# Patient Record
Sex: Male | Born: 1935 | Race: Black or African American | Hispanic: No | Smoking: Former smoker
Health system: Southern US, Community
[De-identification: ages and names within clinical notes are randomized; demographics above are authoritative.]

## PROBLEM LIST (undated history)

## (undated) DIAGNOSIS — M6281 Muscle weakness (generalized): Secondary | ICD-10-CM

## (undated) DIAGNOSIS — R32 Unspecified urinary incontinence: Secondary | ICD-10-CM

## (undated) DIAGNOSIS — E785 Hyperlipidemia, unspecified: Secondary | ICD-10-CM

## (undated) DIAGNOSIS — I69359 Hemiplegia and hemiparesis following cerebral infarction affecting unspecified side: Secondary | ICD-10-CM

## (undated) DIAGNOSIS — H409 Unspecified glaucoma: Secondary | ICD-10-CM

## (undated) DIAGNOSIS — E119 Type 2 diabetes mellitus without complications: Secondary | ICD-10-CM

## (undated) DIAGNOSIS — I10 Essential (primary) hypertension: Secondary | ICD-10-CM

## (undated) DIAGNOSIS — K219 Gastro-esophageal reflux disease without esophagitis: Secondary | ICD-10-CM

## (undated) DIAGNOSIS — D509 Iron deficiency anemia, unspecified: Secondary | ICD-10-CM

## (undated) HISTORY — DX: Hyperlipidemia, unspecified: E78.5

## (undated) HISTORY — DX: Gastro-esophageal reflux disease without esophagitis: K21.9

## (undated) HISTORY — PX: CIRCUMCISION: SUR203

## (undated) HISTORY — PX: CATARACT EXTRACTION: SUR2

## (undated) HISTORY — DX: Iron deficiency anemia, unspecified: D50.9

## (undated) HISTORY — DX: Essential (primary) hypertension: I10

## (undated) HISTORY — DX: Unspecified urinary incontinence: R32

## (undated) HISTORY — DX: Muscle weakness (generalized): M62.81

## (undated) HISTORY — DX: Hemiplegia and hemiparesis following cerebral infarction affecting unspecified side: I69.359

## (undated) HISTORY — PX: TONSILLECTOMY: SUR1361

## (undated) HISTORY — DX: Unspecified glaucoma: H40.9

## (undated) HISTORY — DX: Type 2 diabetes mellitus without complications: E11.9

---

## 2000-08-11 ENCOUNTER — Encounter
Admission: RE | Admit: 2000-08-11 | Discharge: 2000-11-09 | Payer: Self-pay | Admitting: Physical Medicine & Rehabilitation

## 2001-12-23 ENCOUNTER — Inpatient Hospital Stay (HOSPITAL_COMMUNITY): Admission: AD | Admit: 2001-12-23 | Discharge: 2001-12-28 | Payer: Self-pay | Admitting: Cardiology

## 2001-12-23 ENCOUNTER — Encounter: Payer: Self-pay | Admitting: Emergency Medicine

## 2001-12-23 ENCOUNTER — Emergency Department (HOSPITAL_COMMUNITY): Admission: EM | Admit: 2001-12-23 | Discharge: 2001-12-23 | Payer: Self-pay | Admitting: Emergency Medicine

## 2004-11-02 ENCOUNTER — Ambulatory Visit: Payer: Self-pay | Admitting: Cardiology

## 2004-11-10 ENCOUNTER — Ambulatory Visit: Payer: Self-pay | Admitting: Internal Medicine

## 2006-09-12 ENCOUNTER — Ambulatory Visit: Payer: Self-pay | Admitting: Internal Medicine

## 2006-09-12 LAB — CONVERTED CEMR LAB
ALT: 23 units/L (ref 0–40)
AST: 23 units/L (ref 0–37)
Albumin: 3 g/dL — ABNORMAL LOW (ref 3.5–5.2)
Alkaline Phosphatase: 73 units/L (ref 39–117)
BUN: 7 mg/dL (ref 6–23)
Basophils Absolute: 0 10*3/uL (ref 0.0–0.1)
Basophils Relative: 0.7 % (ref 0.0–1.0)
Bilirubin, Direct: 0.1 mg/dL (ref 0.0–0.3)
CO2: 26 meq/L (ref 19–32)
Calcium: 8.5 mg/dL (ref 8.4–10.5)
Chloride: 105 meq/L (ref 96–112)
Creatinine, Ser: 0.4 mg/dL (ref 0.4–1.5)
Eosinophils Absolute: 0.4 10*3/uL (ref 0.0–0.6)
Eosinophils Relative: 7.2 % — ABNORMAL HIGH (ref 0.0–5.0)
GFR calc Af Amer: 272 mL/min
GFR calc non Af Amer: 225 mL/min
Glucose, Bld: 78 mg/dL (ref 70–99)
HCT: 39 % (ref 39.0–52.0)
Hemoglobin: 13 g/dL (ref 13.0–17.0)
Hgb A1c MFr Bld: 5.4 % (ref 4.6–6.0)
Lymphocytes Relative: 34.3 % (ref 12.0–46.0)
MCHC: 33.4 g/dL (ref 30.0–36.0)
MCV: 77.9 fL — ABNORMAL LOW (ref 78.0–100.0)
Monocytes Absolute: 0.7 10*3/uL (ref 0.2–0.7)
Monocytes Relative: 14 % — ABNORMAL HIGH (ref 3.0–11.0)
Neutro Abs: 2.2 10*3/uL (ref 1.4–7.7)
Neutrophils Relative %: 43.8 % (ref 43.0–77.0)
PSA: 0.81 ng/mL (ref 0.10–4.00)
Platelets: 172 10*3/uL (ref 150–400)
Potassium: 3.8 meq/L (ref 3.5–5.1)
RBC: 5.01 M/uL (ref 4.22–5.81)
RDW: 13.9 % (ref 11.5–14.6)
Sodium: 139 meq/L (ref 135–145)
TSH: 1.02 microintl units/mL (ref 0.35–5.50)
Total Bilirubin: 0.6 mg/dL (ref 0.3–1.2)
Total Protein: 7.2 g/dL (ref 6.0–8.3)
WBC: 5 10*3/uL (ref 4.5–10.5)

## 2007-04-10 ENCOUNTER — Encounter: Payer: Self-pay | Admitting: *Deleted

## 2007-04-10 DIAGNOSIS — I2510 Atherosclerotic heart disease of native coronary artery without angina pectoris: Secondary | ICD-10-CM | POA: Insufficient documentation

## 2007-04-10 DIAGNOSIS — K219 Gastro-esophageal reflux disease without esophagitis: Secondary | ICD-10-CM | POA: Insufficient documentation

## 2007-04-10 DIAGNOSIS — Z87898 Personal history of other specified conditions: Secondary | ICD-10-CM

## 2007-04-10 DIAGNOSIS — G8190 Hemiplegia, unspecified affecting unspecified side: Secondary | ICD-10-CM

## 2007-04-10 DIAGNOSIS — I6350 Cerebral infarction due to unspecified occlusion or stenosis of unspecified cerebral artery: Secondary | ICD-10-CM | POA: Insufficient documentation

## 2007-04-10 DIAGNOSIS — Z9861 Coronary angioplasty status: Secondary | ICD-10-CM

## 2013-05-21 ENCOUNTER — Other Ambulatory Visit: Payer: Self-pay | Admitting: *Deleted

## 2013-05-21 MED ORDER — LORAZEPAM 0.5 MG PO TABS: ORAL_TABLET | ORAL | Status: DC

## 2013-05-22 ENCOUNTER — Non-Acute Institutional Stay (SKILLED_NURSING_FACILITY): Payer: Medicare Other | Admitting: Adult Health

## 2013-05-22 DIAGNOSIS — E785 Hyperlipidemia, unspecified: Secondary | ICD-10-CM

## 2013-05-22 DIAGNOSIS — R531 Weakness: Secondary | ICD-10-CM

## 2013-05-22 DIAGNOSIS — E119 Type 2 diabetes mellitus without complications: Secondary | ICD-10-CM

## 2013-05-22 DIAGNOSIS — D509 Iron deficiency anemia, unspecified: Secondary | ICD-10-CM

## 2013-05-22 DIAGNOSIS — M6281 Muscle weakness (generalized): Secondary | ICD-10-CM

## 2013-05-22 NOTE — Progress Notes (Signed)
Patient ID: Derek Crawford, male   DOB: 08-21-1932, 79 y.o.   MRN: [id number] Provider:  Gwenith Spitz. Renato Gails, D.O., C.M.D.  Location:  [facility]  PCP: No primary provider on file.  Code Status: Full Code   Allergies not on file  Chief Complaint  Patient presents with  . Hospitalization Follow-up    iron deficiency anemia    HPI: 79 y.o. male admitted [facility] with c/o of SOB and weakness, was found to have microcytic iron deficiney anemia. Was given 2 units of PRBC and started on iron PO. While there,  blood counts increased and pt. denied having SOB and Dyspnea. He was living in a ALF but son states that he did better when he was at [facility]. Pt has a PMH of stroke with right sided weakness, HTN, diabetes, Dyslipidemia, Urinary incontience and glaucoma.   ROS: Review of Systems  Constitutional: Negative for malaise/fatigue.  Respiratory: Negative for shortness of breath.   Cardiovascular: Negative for chest pain.  Gastrointestinal: Negative for diarrhea and constipation.  Musculoskeletal:       Heel pain when he wakes up in the morning  Neurological: Negative for dizziness and weakness.  Psychiatric/Behavioral: Negative for depression. The patient is not nervous/anxious.      Medications: Patient's Medications  New Prescriptions   No medications on file  Previous Medications   ACETAMINOPHEN (TYLENOL) 325 MG TABLET    Take 650 mg by mouth every 6 (six) hours as needed for moderate pain.   AMLODIPINE (NORVASC) 2.5 MG TABLET    Take 2.5 mg by mouth daily. Hold for BP<100/60 and HR <60   ASPIRIN 81 MG TABLET    Take 81 mg by mouth daily.   CETIRIZINE (ZYRTEC) 10 MG TABLET    Take 10 mg by mouth daily.   DORZOLAMIDE (TRUSOPT) 2 % OPHTHALMIC SOLUTION    Place 1 drop into both eyes 2 (two) times daily.   ESOMEPRAZOLE (NEXIUM) 40 MG CAPSULE    Take 40 mg by mouth daily at 12 noon.   FERROUS SULFATE 325 (65 FE) MG TABLET    Take 325 mg by mouth 2 (two) times  daily with a meal.   FLUTICASONE (FLONASE) 50 MCG/ACT NASAL SPRAY    Place 1 spray into both nostrils daily.   FUROSEMIDE (LASIX) 40 MG TABLET    Take 40 mg by mouth daily.   INSULIN ASPART (NOVOLOG) 100 UNIT/ML INJECTION    Inject 10 Units into the skin 2 (two) times daily. With lunch and dinner   INSULIN ASPART (NOVOLOG) 100 UNIT/ML INJECTION    Inject 4 Units into the skin every morning.   INSULIN ASPART (NOVOLOG) 100 UNIT/ML INJECTION    Inject 5 Units into the skin 3 (three) times daily with meals as needed for high blood sugar (give additional 5 units if CBG >150).   INSULIN DETEMIR (LEVEMIR) 100 UNIT/ML INJECTION    Inject 50 Units into the skin at bedtime.   LISINOPRIL (PRINIVIL,ZESTRIL) 5 MG TABLET    Take 5 mg by mouth daily.   LORAZEPAM (ATIVAN) 0.5 MG TABLET    Take one tablet by mouth every 6 hours as needed   POLYETHYLENE GLYCOL (MIRALAX / GLYCOLAX) PACKET    Take 17 g by mouth daily.   PRAVASTATIN (PRAVACHOL) 20 MG TABLET    Take 20 mg by mouth daily.   SERTRALINE (ZOLOFT) 100 MG TABLET    Take 100 mg by mouth daily.   TAMSULOSIN (FLOMAX) 0.4 MG  CAPS CAPSULE    Take 0.4 mg by mouth daily.   TRAVOPROST, BENZALKONIUM, (TRAVATAN) 0.004 % OPHTHALMIC SOLUTION    Place 1 drop into both eyes at bedtime.   TRAZODONE (DESYREL) 50 MG TABLET    Take 50 mg by mouth at bedtime.  Modified Medications   No medications on file  Discontinued Medications   No medications on file     Physical Exam:  Physical Exam  Vitals reviewed. Constitutional: He is oriented to person, place, and time. He appears well-developed and well-nourished.  Cardiovascular: Normal rate, regular rhythm, normal heart sounds and intact distal pulses.   Pulmonary/Chest: Effort normal and breath sounds normal.  Abdominal: Soft. Bowel sounds are normal.  Neurological: He is alert and oriented to person, place, and time.  Skin: Skin is warm and dry.  Psychiatric: He has a normal mood and affect.   Assessment/Plan 1.  Iron deficiency anemia- Check labs BCC  - Continue ferrous sulfate - Check labs, CBC  2. Other and unspecified hyperlipidemia - Continue Pravachol - Check FLP  3. Type II or unspecified type diabetes mellitus without mention of complication, not stated as uncontrolled - Controlled  4. Right sided weakness - residual weakness from prior stroke -will receive therapy while here  5. Hypertension - Controlled on noravasc and lisinopril   Functional status:  Does better with ADL assistance  Family/ staff Communication:   Discussed with his daughter  Labs/tests ordered:  Cbc, bmp

## 2013-07-10 ENCOUNTER — Encounter: Payer: Self-pay | Admitting: Internal Medicine

## 2013-07-23 ENCOUNTER — Encounter: Payer: Self-pay | Admitting: Internal Medicine

## 2013-07-23 ENCOUNTER — Non-Acute Institutional Stay (SKILLED_NURSING_FACILITY): Payer: Medicare Other | Admitting: Internal Medicine

## 2013-07-23 DIAGNOSIS — I10 Essential (primary) hypertension: Secondary | ICD-10-CM | POA: Insufficient documentation

## 2013-07-23 DIAGNOSIS — R531 Weakness: Secondary | ICD-10-CM

## 2013-07-23 DIAGNOSIS — K219 Gastro-esophageal reflux disease without esophagitis: Secondary | ICD-10-CM

## 2013-07-23 DIAGNOSIS — I69959 Hemiplegia and hemiparesis following unspecified cerebrovascular disease affecting unspecified side: Secondary | ICD-10-CM

## 2013-07-23 DIAGNOSIS — M6281 Muscle weakness (generalized): Secondary | ICD-10-CM

## 2013-07-23 DIAGNOSIS — D509 Iron deficiency anemia, unspecified: Secondary | ICD-10-CM

## 2013-07-23 DIAGNOSIS — E119 Type 2 diabetes mellitus without complications: Secondary | ICD-10-CM

## 2013-07-23 DIAGNOSIS — E785 Hyperlipidemia, unspecified: Secondary | ICD-10-CM

## 2013-07-23 DIAGNOSIS — R32 Unspecified urinary incontinence: Secondary | ICD-10-CM

## 2013-07-23 DIAGNOSIS — I69359 Hemiplegia and hemiparesis following cerebral infarction affecting unspecified side: Secondary | ICD-10-CM

## 2013-07-23 NOTE — Progress Notes (Signed)
MRN: [id number] Name: Derek Crawford  Sex: male Age: 80 y.o. DOB: 26-Jul-1932  [place] #: Karren Burly Facility/Room: 120A Level Of Care: SNF Provider: Inocencio Homes D Emergency Contacts: Extended Emergency Contact Information Primary Emergency Contact: Iwanicki,[name] Address: [address]  [place] Home Phone: [phone] Relation: None  Code Status: FULL  Allergies: Review of patient's allergies indicates no known allergies.  Chief Complaint  Patient presents with  . Discharge Note    HPI: Patient is 80 y.o. male who is s/p CVA with R hemiparesis who had declined and not able to stay in ALF, weak, chronic dyspnea and deconditioned, admitted here for OT/PT who is being discharged.   Past Medical History  Diagnosis Date  . Hemiplegia following CVA (cerebrovascular accident)     R side weakness  . Hypertension   . Hyperlipidemia   . Diabetes mellitus without complication   . GERD (gastroesophageal reflux disease)   . Urinary incontinence     History reviewed. No pertinent past surgical history.    Medication List       This list is accurate as of: 07/23/13 11:59 PM.  Always use your most recent med list.               acetaminophen 325 MG tablet  Commonly known as:  TYLENOL  Take 650 mg by mouth 2 (two) times daily.     amLODipine 2.5 MG tablet  Commonly known as:  NORVASC  Take 2.5 mg by mouth daily. Hold for BP<100/60 and HR <60     aspirin 81 MG tablet  Take 81 mg by mouth daily.     cetirizine 10 MG tablet  Commonly known as:  ZYRTEC  Take 10 mg by mouth daily.     dorzolamide 2 % ophthalmic solution  Commonly known as:  TRUSOPT  Place 1 drop into both eyes 2 (two) times daily.     esomeprazole 40 MG capsule  Commonly known as:  NEXIUM  Take 40 mg by mouth daily at 12 noon.     ferrous sulfate 325 (65 FE) MG tablet  Take 325 mg by mouth 2 (two) times daily with a meal.     fluticasone 50 MCG/ACT nasal spray  Commonly known as:   FLONASE  Place 1 spray into both nostrils daily.     furosemide 40 MG tablet  Commonly known as:  LASIX  Take 40 mg by mouth daily.     insulin aspart 100 UNIT/ML injection  Commonly known as:  novoLOG  Inject 10 Units into the skin 2 (two) times daily. With lunch and dinner     insulin aspart 100 UNIT/ML injection  Commonly known as:  novoLOG  Inject 4 Units into the skin every morning.     insulin aspart 100 UNIT/ML injection  Commonly known as:  novoLOG  Inject 5 Units into the skin 3 (three) times daily with meals as needed for high blood sugar (give additional 5 units if CBG >150).     insulin detemir 100 UNIT/ML injection  Commonly known as:  LEVEMIR  Inject 50 Units into the skin at bedtime.     lisinopril 5 MG tablet  Commonly known as:  PRINIVIL,ZESTRIL  Take 5 mg by mouth daily.     LORazepam 0.5 MG tablet  Commonly known as:  ATIVAN  Take one tablet by mouth every 6 hours as needed     lubiprostone 24 MCG capsule  Commonly  known as:  AMITIZA  Take 24 mcg by mouth 2 (two) times daily with a meal.     polyethylene glycol packet  Commonly known as:  MIRALAX / GLYCOLAX  Take 17 g by mouth daily.     pravastatin 20 MG tablet  Commonly known as:  PRAVACHOL  Take 20 mg by mouth daily.     sertraline 100 MG tablet  Commonly known as:  ZOLOFT  Take 100 mg by mouth daily.     tamsulosin 0.4 MG Caps capsule  Commonly known as:  FLOMAX  Take 0.4 mg by mouth daily.     travoprost (benzalkonium) 0.004 % ophthalmic solution  Commonly known as:  TRAVATAN  Place 1 drop into both eyes at bedtime.     traZODone 50 MG tablet  Commonly known as:  DESYREL  Take 50 mg by mouth at bedtime.        Meds ordered this encounter  Medications  . lubiprostone (AMITIZA) 24 MCG capsule    Sig: Take 24 mcg by mouth 2 (two) times daily with a meal.    Immunization History  Administered Date(s) Administered  . Influenza-Unspecified 04/10/2013  . Pneumococcal-Unspecified  05/17/2013    History  Substance Use Topics  . Smoking status: Never Smoker   . Smokeless tobacco: Not on file  . Alcohol Use: No    Filed Vitals:   07/23/13 1211  BP: 148/81  Pulse: 81  Temp: 97.5 F (36.4 C)  Resp: 19    Physical Exam  GENERAL APPEARANCE: Alert, conversant. Appropriately groomed. No acute distress.  HEENT: Unremarkable. RESPIRATORY: Breathing is even, unlabored. Lung sounds are clear   CARDIOVASCULAR: Heart RRR no murmurs, rubs or gallops. No peripheral edema.  GASTROINTESTINAL: Abdomen is soft, non-tender, not distended w/ normal bowel sounds.  NEUROLOGIC: Cranial nerves 2-12 grossly intact.   Patient Active Problem List   Diagnosis Date Noted  . Hemiplegia following CVA (cerebrovascular accident)   . Hypertension   . Hyperlipidemia   . Urinary incontinence   . Iron deficiency anemia 05/22/2013  . Other and unspecified hyperlipidemia 05/22/2013  . Type II or unspecified type diabetes mellitus without mention of complication, not stated as uncontrolled 05/22/2013  . Right sided weakness 05/22/2013  . HEMIPARESIS, RIGHT 04/10/2007  . CORONARY ARTERY DISEASE 04/10/2007  . CVA 04/10/2007  . GERD 04/10/2007  . GLAUCOMA, HX OF 04/10/2007  . PERCUTANEOUS TRANSLUMINAL CORONARY ANGIOPLASTY, HX OF 04/10/2007    CBC    Component Value Date/Time   WBC 5.0 09/12/2006 1443   RBC 5.01 09/12/2006 1443   HGB 13.0 09/12/2006 1443   HCT 39.0 09/12/2006 1443   PLT 172 09/12/2006 1443   MCV 77.9* 09/12/2006 1443   MONOABS 0.7 09/12/2006 1443   EOSABS 0.4 09/12/2006 1443   BASOSABS 0.0 09/12/2006 1443    CMP     Component Value Date/Time   NA 139 09/12/2006 1443   K 3.8 09/12/2006 1443   CL 105 09/12/2006 1443   CO2 26 09/12/2006 1443   GLUCOSE 78 09/12/2006 1443   BUN 7 09/12/2006 1443   CREATININE 0.4 09/12/2006 1443   CALCIUM 8.5 09/12/2006 1443   PROT 7.2 09/12/2006 1443   ALBUMIN 3.0* 09/12/2006 1443   AST 23 09/12/2006 1443   ALT 23 09/12/2006 1443   ALKPHOS 73 09/12/2006 1443    BILITOT 0.6 09/12/2006 1443   GFRNONAA 225 09/12/2006 1443   GFRAA 272 09/12/2006 1443    Assessment and Plan  Patient is in stable  condition and is being discharged.  Hennie Duos, MD

## 2013-08-07 ENCOUNTER — Ambulatory Visit (INDEPENDENT_AMBULATORY_CARE_PROVIDER_SITE_OTHER): Payer: Medicare Other | Admitting: Internal Medicine

## 2013-08-07 ENCOUNTER — Encounter: Payer: Self-pay | Admitting: Internal Medicine

## 2013-08-07 ENCOUNTER — Other Ambulatory Visit (INDEPENDENT_AMBULATORY_CARE_PROVIDER_SITE_OTHER): Payer: Medicare Other

## 2013-08-07 VITALS — BP 120/60 | HR 84

## 2013-08-07 DIAGNOSIS — D509 Iron deficiency anemia, unspecified: Secondary | ICD-10-CM

## 2013-08-07 DIAGNOSIS — K5900 Constipation, unspecified: Secondary | ICD-10-CM | POA: Insufficient documentation

## 2013-08-07 LAB — CBC WITH DIFFERENTIAL/PLATELET
BASOS PCT: 0.2 % (ref 0.0–3.0)
Basophils Absolute: 0 10*3/uL (ref 0.0–0.1)
Eosinophils Absolute: 0.2 10*3/uL (ref 0.0–0.7)
Eosinophils Relative: 2.1 % (ref 0.0–5.0)
HCT: 44.1 % (ref 39.0–52.0)
HEMOGLOBIN: 13.9 g/dL (ref 13.0–17.0)
LYMPHS PCT: 17.6 % (ref 12.0–46.0)
Lymphs Abs: 1.6 10*3/uL (ref 0.7–4.0)
MCHC: 31.6 g/dL (ref 30.0–36.0)
MCV: 76.3 fl — AB (ref 78.0–100.0)
Monocytes Absolute: 1 10*3/uL (ref 0.1–1.0)
Monocytes Relative: 10.5 % (ref 3.0–12.0)
NEUTROS ABS: 6.4 10*3/uL (ref 1.4–7.7)
Neutrophils Relative %: 69.6 % (ref 43.0–77.0)
Platelets: 237 10*3/uL (ref 150.0–400.0)
RBC: 5.78 Mil/uL (ref 4.22–5.81)
RDW: 24.5 % — AB (ref 11.5–14.6)
WBC: 9.2 10*3/uL (ref 4.5–10.5)

## 2013-08-07 LAB — VITAMIN B12: Vitamin B-12: 267 pg/mL (ref 211–911)

## 2013-08-07 LAB — FERRITIN: Ferritin: 33.9 ng/mL (ref 22.0–322.0)

## 2013-08-07 MED ORDER — NA SULFATE-K SULFATE-MG SULF 17.5-3.13-1.6 GM/177ML PO SOLN: ORAL | Status: DC

## 2013-08-07 NOTE — Assessment & Plan Note (Signed)
He has a microcytic anemia. Causes not clear. Probably iron deficiency. He has been on iron and this may be improved. It certainly could be a component of chronic disease as well the hemoglobin A1c 12.4%. I'm going to reassess his hemoglobin today also check a B12 and ferritin. He will be scheduled for an upper GI endoscopy and colonoscopy at [facility] given his comorbidities.The risks and benefits as well as alternatives of endoscopic procedure(s) have been discussed and reviewed. All questions answered. The patient agrees to proceed. These will help [place] evaluate for chronic occult blood loss problem such as GI neoplasm and AVMs.

## 2013-08-07 NOTE — Patient Instructions (Addendum)
You have been scheduled for a colonoscopy at [facility]. Please follow written instructions given to you at your visit today.  Please use the prep kit you have been given today. If you use inhalers (even only as needed), please bring them with you on the day of your procedure.  Your physician has requested that you go to the basement for the following lab work before leaving today: CBC/diff, Ferritin, B-12  I appreciate the opportunity to care for you.

## 2013-08-07 NOTE — Assessment & Plan Note (Signed)
Continue MiraLax

## 2013-08-07 NOTE — Progress Notes (Signed)
Subjective:  Referred by: Hennie Duos, MD   Patient ID: Derek Crawford, male    DOB: 09/18/1932, 80 y.o.   MRN: [id number]  HPI The patient is a very nice elderly man, a resident of [facility] because of a prior stroke leading to a left hemiplegia. He was noted to have a microcytic anemia in November with an MCV of 68 and hemoglobin of 10.9. Platelets were normal, RDW is 20.1. Hemoglobin A1c was 12.8%. The patient reports constipation in the last 6 months to the initiation of MiraLax has made a difference. He is also on lubiprostone. He says his stools are dark green but he does not have melena or bleeding. He denies any abdominal pain dysphagia nausea vomiting heartburn or other GI symptoms. He is here with a care assistant from [facility] who confirms  No Known Allergies Outpatient Prescriptions Prior to Visit  Medication Sig Dispense Refill  . acetaminophen (TYLENOL) 325 MG tablet Take 650 mg by mouth 2 (two) times daily.       Marland Kitchen amLODipine (NORVASC) 2.5 MG tablet Take 2.5 mg by mouth daily. Hold for BP<100/60 and HR <60      . aspirin 81 MG tablet Take 81 mg by mouth daily.      . cetirizine (ZYRTEC) 10 MG tablet Take 10 mg by mouth daily.      . dorzolamide (TRUSOPT) 2 % ophthalmic solution Place 1 drop into both eyes 2 (two) times daily.      Marland Kitchen esomeprazole (NEXIUM) 40 MG capsule Take 40 mg by mouth daily at 12 noon.      . ferrous sulfate 325 (65 FE) MG tablet Take 325 mg by mouth 2 (two) times daily with a meal.      . fluticasone (FLONASE) 50 MCG/ACT nasal spray Place 1 spray into both nostrils daily.      . furosemide (LASIX) 40 MG tablet Take 40 mg by mouth daily.      . insulin aspart (NOVOLOG) 100 UNIT/ML injection Inject 10 Units into the skin 2 (two) times daily. With lunch and dinner      . insulin aspart (NOVOLOG) 100 UNIT/ML injection Inject 4 Units into the skin every morning.      . insulin aspart (NOVOLOG) 100 UNIT/ML injection Inject 5  Units into the skin 3 (three) times daily with meals as needed for high blood sugar (give additional 5 units if CBG >150).      . insulin detemir (LEVEMIR) 100 UNIT/ML injection Inject 50 Units into the skin at bedtime.      Marland Kitchen lisinopril (PRINIVIL,ZESTRIL) 5 MG tablet Take 5 mg by mouth daily.      Marland Kitchen LORazepam (ATIVAN) 0.5 MG tablet Take one tablet by mouth every 6 hours as needed  120 tablet  5  . lubiprostone (AMITIZA) 24 MCG capsule Take 24 mcg by mouth 2 (two) times daily with a meal.      . polyethylene glycol (MIRALAX / GLYCOLAX) packet Take 17 g by mouth daily.      . pravastatin (PRAVACHOL) 20 MG tablet Take 20 mg by mouth daily.      . sertraline (ZOLOFT) 100 MG tablet Take 100 mg by mouth daily.      . tamsulosin (FLOMAX) 0.4 MG CAPS capsule Take 0.4 mg by mouth daily.      . travoprost, benzalkonium, (TRAVATAN) 0.004 % ophthalmic solution Place 1 drop into both eyes at bedtime.      Marland Kitchen  traZODone (DESYREL) 50 MG tablet Take 50 mg by mouth at bedtime.       No facility-administered medications prior to visit.   Past Medical History  Diagnosis Date  . Hemiplegia following CVA (cerebrovascular accident)     R side weakness  . Hypertension   . Hyperlipidemia   . Diabetes mellitus without complication   . GERD (gastroesophageal reflux disease)   . Urinary incontinence   . Iron deficiency anemia   . Glaucoma    Past Surgical History  Procedure Laterality Date  . Circumcision    . Cataract extraction Left   . Tonsillectomy     History   Social History  . Marital Status: Widowed                 Social History Main Topics  . Smoking status: Former Research scientist (life sciences)  . Smokeless tobacco: Never Used  . Alcohol Use: No  . Drug Use: No  . Sexual Activity: No          Social History Narrative   Resides in [facility]. One of his sons is in charge of the [employer] and that is why the patient moved here from [place],  [place].    Family History  Problem Relation Age of Onset  . Colon cancer Neg Hx   . Throat cancer Neg Hx   . Diabetes Maternal Grandfather   . Heart disease Neg Hx   . Kidney disease Father   . Liver disease Neg Hx         Review of Systems     Objective:   Physical Exam General:  Well-developed, well-nourished and in no acute distress - but chronically ill black man ENT:   Mouth and posterior pharynx free of lesions. + missing most teeth Lungs: Coarse BS to auscultation bilaterally. Heart:  S1S2, no rubs, murmurs, gallops. Abdomen:  soft, non-tender, no hepatosplenomegaly, hernia, or mass and BS+.  Rectal: deferred Extremities:   no edema Neuro:  A&O x 3. R hemiplegia Psych:  appropriate mood and  Affect.   Data Reviewed: CBC, other 05/2013 labs - scanned in    Assessment & Plan:   1. Microcytic anemia   2. Unspecified constipation    I appreciate the opportunity to care for this patient. CC: REED, TIFFANY, DO

## 2013-08-08 NOTE — Progress Notes (Signed)
Quick Note:  HGb normal Iron still on low side B12 ok Call patient or send copy to his SNF and also cc PCP ______

## 2013-08-13 ENCOUNTER — Non-Acute Institutional Stay (SKILLED_NURSING_FACILITY): Payer: Medicare Other | Admitting: Internal Medicine

## 2013-08-13 DIAGNOSIS — I69359 Hemiplegia and hemiparesis following cerebral infarction affecting unspecified side: Secondary | ICD-10-CM

## 2013-08-13 DIAGNOSIS — E785 Hyperlipidemia, unspecified: Secondary | ICD-10-CM

## 2013-08-13 DIAGNOSIS — E119 Type 2 diabetes mellitus without complications: Secondary | ICD-10-CM

## 2013-08-13 DIAGNOSIS — I69959 Hemiplegia and hemiparesis following unspecified cerebrovascular disease affecting unspecified side: Secondary | ICD-10-CM

## 2013-08-13 NOTE — Progress Notes (Signed)
Patient ID: Derek Crawford, male   DOB: 1933-03-06, 80 y.o.   MRN: [id number] Facility; [facility] Chief complaint; transfer from [facility] [place] History; this patient comes from [facility]. I'm not really sure what prompted the transfer. As I understand things he has been in the facility perhaps since the fall of 2014. Previous to this had been at [facility] with anemia and was transfused. The exact source of the iron deficiency anemia is not specifically stated. It would appear that he has had the previous rubra vascular disease with right sided weakness. The patient tells me he is nonambulatory for many years  Past Medical History  Diagnosis Date  . Hemiplegia following CVA (cerebrovascular accident)     R side weakness  . Hypertension   . Hyperlipidemia   . Diabetes mellitus without complication   . GERD (gastroesophageal reflux disease)   . Urinary incontinence   . Iron deficiency anemia   . Glaucoma    Past Surgical History  Procedure Laterality Date  . Circumcision    . Cataract extraction Left   . Tonsillectomy     Medications; this includes ASA 81 daily, Zyrtec 10 daily, Nexium 40 daily, Lasix 40 every morning, lisinopril 5 every morning, Norvasc 2.5 daily, MiraLax 17 g daily, pravastatin 20 mg each bedtime, Zoloft 100 by mouth every morning, Flomax 0.4 each bedtime, travoprost ophthalmic one drop each eye each bedtime for glaucoma. Trazodone 50 mg by mouth each bedtime, Depakote 250 one by mouth twice a day, iron 325 one by mouth twice a day, Flonase one spray each nostril twice a day, amitiza 24 mcg one capsule by mouth twice a day, Levemir 50 units subcutaneous each bedtime, NovoLog 4 units subcutaneous every morning 10 units subcutaneous q. lunch and dinner NovoLog 5 units subcutaneous additionally if the CBG is greater than 150  Social; have very little information on this man other than what is stated above in the history of  present illness. He tells me he has been nonambulatory for years. He does not come with advanced directives  Review of systems Respiratory no complaining of shortness of breath Cardiac no exertional chest pain GI he states he has chronic constipation not sure of the exact status   Physical examination Patient seen sitting in his wheelchair. He is conversational and pleasant Respiratory clear entry bilaterally Cardiac heart sounds are normal no murmurs no carotid bruits Abdomen somewhat distended however no tenderness or masses noted GU bladder not distended. Extremities probable PID. Neurologic mild weakness of the right upper extremity there is significant weakness of the right lower extremity. Reflexes are brisk bilaterally  Impression/plan #1 iron deficiency anemia of unclear etiology. I will recheck this. The source of any bleeding he has it was not clearly identified #2 late effect predominantly dominant hemisphere CVA with right lower greater than right upper extremity weakness which would suggest an anterior cerebral distribute.  #3 type 2 diabetes? On insulin. He will need a hemoglobin A1c #4 hypertension we'll need to monitor. BUN and creatinine will need to be checked 2. #5 hyperlipidemia we'll check a fasting lipid #6 chronic constipation on Amitiza #7 allergic rhinitis #8 glaucoma #9 BPH #10 gastroesophageal reflux disease #76 on a complicated psychiatric regimen. I must say at first pass I don't see a good reason further Depakote although I am always concerned that insufficient information is available.

## 2013-08-14 ENCOUNTER — Telehealth: Payer: Self-pay | Admitting: Internal Medicine

## 2013-08-14 NOTE — Telephone Encounter (Signed)
Spoke with Kendrick [facility] and told her it will be tomorrow before we can reschedule this procedure because Dr. Carlean Purl  is not in the office to discuss a new date.

## 2013-08-15 NOTE — Telephone Encounter (Signed)
Cherrie Distance at the nursing home that this is not a flexible appointment.  That the patient will keep the appt for 08/23/13

## 2013-08-22 ENCOUNTER — Telehealth: Payer: Self-pay | Admitting: Internal Medicine

## 2013-08-22 NOTE — Telephone Encounter (Signed)
Nursing home instructed to keep the appt as directed.  I have faxed another copy of the instructions.  They are advised they need to start his prep within the hour

## 2013-08-23 ENCOUNTER — Encounter (HOSPITAL_COMMUNITY): Payer: Self-pay

## 2013-08-23 ENCOUNTER — Encounter (HOSPITAL_COMMUNITY)
Admission: RE | Disposition: A | Discharge status: Home / Self Care | Payer: Self-pay | Source: Ambulatory Visit | Attending: Internal Medicine

## 2013-08-23 ENCOUNTER — Ambulatory Visit (HOSPITAL_COMMUNITY)
Admission: RE | Admit: 2013-08-23 | Discharge: 2013-08-23 | Disposition: A | Discharge status: Home / Self Care | Payer: Medicare Other | Source: Ambulatory Visit | Attending: Internal Medicine | Admitting: Internal Medicine

## 2013-08-23 DIAGNOSIS — D126 Benign neoplasm of colon, unspecified: Secondary | ICD-10-CM

## 2013-08-23 DIAGNOSIS — K5900 Constipation, unspecified: Secondary | ICD-10-CM | POA: Insufficient documentation

## 2013-08-23 DIAGNOSIS — I69959 Hemiplegia and hemiparesis following unspecified cerebrovascular disease affecting unspecified side: Secondary | ICD-10-CM | POA: Insufficient documentation

## 2013-08-23 DIAGNOSIS — E119 Type 2 diabetes mellitus without complications: Secondary | ICD-10-CM | POA: Insufficient documentation

## 2013-08-23 DIAGNOSIS — Z87891 Personal history of nicotine dependence: Secondary | ICD-10-CM | POA: Insufficient documentation

## 2013-08-23 DIAGNOSIS — Z7982 Long term (current) use of aspirin: Secondary | ICD-10-CM | POA: Insufficient documentation

## 2013-08-23 DIAGNOSIS — Z794 Long term (current) use of insulin: Secondary | ICD-10-CM | POA: Insufficient documentation

## 2013-08-23 DIAGNOSIS — I10 Essential (primary) hypertension: Secondary | ICD-10-CM | POA: Insufficient documentation

## 2013-08-23 DIAGNOSIS — K449 Diaphragmatic hernia without obstruction or gangrene: Secondary | ICD-10-CM | POA: Insufficient documentation

## 2013-08-23 DIAGNOSIS — K219 Gastro-esophageal reflux disease without esophagitis: Secondary | ICD-10-CM | POA: Insufficient documentation

## 2013-08-23 DIAGNOSIS — D509 Iron deficiency anemia, unspecified: Secondary | ICD-10-CM | POA: Insufficient documentation

## 2013-08-23 DIAGNOSIS — Z79899 Other long term (current) drug therapy: Secondary | ICD-10-CM | POA: Insufficient documentation

## 2013-08-23 DIAGNOSIS — E785 Hyperlipidemia, unspecified: Secondary | ICD-10-CM | POA: Insufficient documentation

## 2013-08-23 HISTORY — PX: COLONOSCOPY: SHX5424

## 2013-08-23 HISTORY — PX: ESOPHAGOGASTRODUODENOSCOPY: SHX5428

## 2013-08-23 LAB — GLUCOSE, CAPILLARY: Glucose-Capillary: 109 mg/dL — ABNORMAL HIGH (ref 70–99)

## 2013-08-23 SURGERY — EGD (ESOPHAGOGASTRODUODENOSCOPY)
Anesthesia: Moderate Sedation

## 2013-08-23 MED ORDER — BUTAMBEN-TETRACAINE-BENZOCAINE 2-2-14 % EX AERO
INHALATION_SPRAY | CUTANEOUS | Status: DC | PRN
  Administered 2013-08-23: 2 via TOPICAL

## 2013-08-23 MED ORDER — SPOT INK MARKER SYRINGE KIT
PACK | SUBMUCOSAL | Status: DC | PRN
  Administered 2013-08-23: 3 mL via SUBMUCOSAL

## 2013-08-23 MED ORDER — FENTANYL CITRATE 0.05 MG/ML IJ SOLN
INTRAMUSCULAR | Status: DC | PRN
  Administered 2013-08-23 (×2): 25 ug via INTRAVENOUS

## 2013-08-23 MED ORDER — SPOT INK MARKER SYRINGE KIT
PACK | SUBMUCOSAL | Status: AC
  Filled 2013-08-23: qty 5

## 2013-08-23 MED ORDER — MIDAZOLAM HCL 10 MG/2ML IJ SOLN
INTRAMUSCULAR | Status: DC | PRN
  Administered 2013-08-23: 2 mg via INTRAVENOUS
  Administered 2013-08-23: 1 mg via INTRAVENOUS

## 2013-08-23 MED ORDER — SODIUM CHLORIDE 0.9 % IV SOLN
INTRAVENOUS | Status: DC
  Administered 2013-08-23: 11:00:00 via INTRAVENOUS

## 2013-08-23 MED ORDER — MIDAZOLAM HCL 10 MG/2ML IJ SOLN
INTRAMUSCULAR | Status: AC
  Filled 2013-08-23: qty 2

## 2013-08-23 MED ORDER — ASPIRIN 81 MG PO TABS: 81.0000 mg | ORAL_TABLET | Freq: Every day | ORAL | Status: DC

## 2013-08-23 MED ORDER — FENTANYL CITRATE 0.05 MG/ML IJ SOLN
INTRAMUSCULAR | Status: AC
  Filled 2013-08-23: qty 2

## 2013-08-23 NOTE — Progress Notes (Signed)
Procedure report and discharge instructions discussed with [name], patient's nurse at [facility]. Nurse verbalized understanding of instructions. Discharge instructions sent with patient, via transporter, to facility for nurse's review. Derek Crawford

## 2013-08-23 NOTE — Interval H&P Note (Signed)
History and Physical Interval Note:  08/23/2013 11:01 AM  Derek Crawford  has presented today for surgery, with the diagnosis of microcytic anemia  The various methods of treatment have been discussed with the patient and family. After consideration of risks, benefits and other options for treatment, the patient has consented to  Procedure(s): ESOPHAGOGASTRODUODENOSCOPY (EGD) (N/A) COLONOSCOPY (N/A) as a surgical intervention .  The patient's history has been reviewed, patient examined, no change in status, stable for surgery.  I have reviewed the patient's chart and labs.  Questions were answered to the patient's satisfaction.     Silvano Rusk, MD, [facility]

## 2013-08-23 NOTE — Op Note (Signed)
[facility] [place], [id number]   ENDOSCOPY PROCEDURE REPORT  PATIENT: Derek Crawford, Derek Crawford  MR#: [id number] BIRTHDATE: 1932-09-06 , 81  yrs. old GENDER: Male ENDOSCOPIST: Gatha Mayer, MD, [facility] REFERRED BY:   Inocencio Homes, MD PROCEDURE DATE:  08/23/2013 PROCEDURE:  EGD, diagnostic ASA CLASS:     Class III INDICATIONS:  Anemia. MEDICATIONS: Fentanyl 25 mcg IV and Versed 2 mg IV TOPICAL ANESTHETIC: Cetacaine Spray  DESCRIPTION OF PROCEDURE: After the risks benefits and alternatives of the procedure were thoroughly explained, informed consent was obtained.  The Pentax Gastroscope [id number] endoscope was introduced through the mouth and advanced to the second portion of the duodenum. Without limitations.  The instrument was slowly withdrawn as the mucosa was fully examined.        STOMACH: A 5 cm hiatal hernia was noted.  The remainder of the upper endoscopy exam was otherwise normal. Retroflexed views revealed a hiatal hernia.     The scope was then withdrawn from the patient and the procedure completed.  COMPLICATIONS: There were no complications. ENDOSCOPIC IMPRESSION: 1.   5 cm hiatal hernia 2.   The remainder of the upper endoscopy exam was otherwise normal  RECOMMENDATIONS: Proceed with a Colonoscopy.   eSigned:  Gatha Mayer, MD, [facility] 08/23/2013 1:37 PM   CC:The Patient  and Inocencio Homes, MD [facility])

## 2013-08-23 NOTE — Discharge Instructions (Addendum)
There was a large polyp removed from the colon. It was mostly removed today so will need another colonoscopy to finish removal later this year most likely.  There was a hiatal hernia on endoscopy of upper GI tract.  YOU HAD AN ENDOSCOPIC PROCEDURE TODAY: Refer to the procedure report and other information in the discharge instructions given to you for any specific questions about what was found during the examination. If this information does not answer your questions, please call Dr. Celesta Aver office at [phone] to clarify.   YOU SHOULD EXPECT: Some feelings of bloating in the abdomen. Passage of more gas than usual. Walking can help get rid of the air that was put into your GI tract during the procedure and reduce the bloating. If you had a lower endoscopy (such as a colonoscopy or flexible sigmoidoscopy) you may notice spotting of blood in your stool or on the toilet paper. Some abdominal soreness may be present for a day or two, also.  DIET: Your first meal following the procedure should be a light meal and then it is ok to progress to your normal diet. A half-sandwich or bowl of soup is an example of a good first meal. Heavy or fried foods are harder to digest and may make you feel nauseous or bloated. Drink plenty of fluids but you should avoid alcoholic beverages for 24 hours.   ACTIVITY: Your care partner should take you home directly after the procedure. You should plan to take it easy, moving slowly for the rest of the day. You can resume normal activity the day after the procedure however YOU SHOULD NOT DRIVE, use power tools, machinery or perform tasks that involve climbing or major physical exertion for 24 hours (because of the sedation medicines used during the test).   SYMPTOMS TO REPORT IMMEDIATELY: A gastroenterologist can be reached at any hour. Please call [phone]  for any of the following symptoms:  Following lower endoscopy (colonoscopy, flexible sigmoidoscopy) Excessive  amounts of blood in the stool  Significant tenderness, worsening of abdominal pains  Swelling of the abdomen that is new, acute  Fever of 100 or higher  Following upper endoscopy (EGD, EUS, ERCP, esophageal dilation) Vomiting of blood or coffee ground material  New, significant abdominal pain  New, significant chest pain or pain under the shoulder blades  Painful or persistently difficult swallowing  New shortness of breath  Black, tarry-looking or red, bloody stools  FOLLOW UP:  If any biopsies were taken you will be contacted by phone or by letter within the next 1-3 weeks. Call [phone]  if you have not heard about the biopsies in 3 weeks.  Please also call with any specific questions about appointments or follow up tests.  I appreciate the opportunity to care for you. Gatha Mayer, MD, [facility]   Conscious Sedation, Adult, Care After Refer to this sheet in the next few weeks. These instructions provide you with information on caring for yourself after your procedure. Your health care provider may also give you more specific instructions. Your treatment has been planned according to current medical practices, but problems sometimes occur. Call your health care provider if you have any problems or questions after your procedure. WHAT TO EXPECT AFTER THE PROCEDURE  After your procedure:  You may feel sleepy, clumsy, and have poor balance for several hours.  Vomiting may occur if you eat too soon after the procedure. HOME CARE INSTRUCTIONS  Do not participate in any activities where you could become injured  for at least 24 hours. Do not:  Drive.  Swim.  Ride a bicycle.  Operate heavy machinery.  Cook.  Use power tools.  Climb ladders.  Work from a high place.  Do not make important decisions or sign legal documents until you are improved.  If you vomit, drink water, juice, or soup when you can drink without vomiting. Make sure you have little or no nausea before  eating solid foods.  Only take over-the-counter or prescription medicines for pain, discomfort, or fever as directed by your health care provider.  Make sure you and your family fully understand everything about the medicines given to you, including what side effects may occur.  You should not drink alcohol, take sleeping pills, or take medicines that cause drowsiness for at least 24 hours.  If you smoke, do not smoke without supervision.  If you are feeling better, you may resume normal activities 24 hours after you were sedated.  Keep all appointments with your health care provider. SEEK MEDICAL CARE IF:  Your skin is pale or bluish in color.  You continue to feel nauseous or vomit.  Your pain is getting worse and is not helped by medicine.  You have bleeding or swelling.  You are still sleepy or feeling clumsy after 24 hours. SEEK IMMEDIATE MEDICAL CARE IF:  You develop a rash.  You have difficulty breathing.  You develop any type of allergic problem.  You have a fever. MAKE SURE YOU:  Understand these instructions.  Will watch your condition.  Will get help right away if you are not doing well or get worse. Document Released: 04/17/2013 Document Reviewed: 02/01/2013 [facility] Patient Information 2014 [place], [place].

## 2013-08-23 NOTE — Op Note (Signed)
[facility] [place], [address]   COLONOSCOPY PROCEDURE REPORT  PATIENT: Derek, Crawford  MR#: [id number] BIRTHDATE: July 25, 1932 , 81  yrs. old GENDER: Male ENDOSCOPIST: Gatha Mayer, MD, [facility] REFERRED BY:   Noah Delaine. Sheppard Coil, MD PROCEDURE DATE:  08/23/2013 PROCEDURE:   Colonoscopy with snare polypectomy, Submucosal injection, any substance, and Colonoscopy with tissue ablation First Screening Colonoscopy - Avg.  risk and is 50 yrs.  old or older - No.  Prior Negative Screening - Now for repeat screening. N/A  History of Adenoma - Now for follow-up colonoscopy & has been > or = to 3 yrs.  N/A  Polyps Removed Today? Yes. ASA CLASS:   Class III INDICATIONS:Microcytic  Anemia. MEDICATIONS: There was residual sedation effect present from prior procedure, Fentanyl 25 mcg IV, and Versed 1mg  IV  DESCRIPTION OF PROCEDURE:   After the risks benefits and alternatives of the procedure were thoroughly explained, informed consent was obtained.  A digital rectal exam revealed no rectal mass.   The     endoscope was introduced through the anus and advanced to the cecum, which was identified by both the appendix and ileocecal valve. No adverse events experienced.   The quality of the prep was Suprep fair  The instrument was then slowly withdrawn as the colon was fully examined.   COLON FINDINGS: A large polypoid shaped sessile polyp was found in the ascending colon. 1/4 circumference over a fold.  A polypectomy was performed using snare cautery in piecemeal fashion.  It was mostly resected but was incomplete and the polyp tissue was completely retrieved.  Destruction of residual flat polyp tissue and polypectomy edges was performed.   Argon plasma coagulation was used.  Care was given to ensure that the lumen was suctioned well. A tattoo was applied in multiple sites proximal and distal to the polypectomy site.   The colon mucosa was otherwise  normal. Retroflexed views revealed no abnormalities. The time to cecum=17 minutes 0 seconds.  Withdrawal time=49 minutes 0 seconds.  The scope was withdrawn and the procedure completed. COMPLICATIONS: There were no complications.  ENDOSCOPIC IMPRESSION: 1.   Large sessile polyp was found in the ascending colon; polypectomy was performed using snare cautery; Destruction of tissue via APC ablation was also used; a tattoo was applied 2.   The colon mucosa was otherwise normal - fair prep  RECOMMENDATIONS: 1.  Hold aspirin, aspirin products, and anti-inflammatory medication for 2 weeks. 2.  Timing of repeat colonoscopy will be determined by pathology findings. Will need completion polypectomy and better prepped colonoscopy 2015 3.   May need to prep in house next time  eSigned:  Gatha Mayer, MD, [facility] 08/23/2013 1:33 PM cc: The Patient    and Inocencio Homes, MD [facility])

## 2013-08-23 NOTE — H&P (View-Only) (Signed)
Subjective:  Referred by: Hennie Duos, MD   Patient ID: Derek Crawford, male    DOB: 09/18/1932, 80 y.o.   MRN: [id number]  HPI The patient is a very nice elderly man, a resident of [facility] because of a prior stroke leading to a left hemiplegia. He was noted to have a microcytic anemia in November with an MCV of 68 and hemoglobin of 10.9. Platelets were normal, RDW is 20.1. Hemoglobin A1c was 12.8%. The patient reports constipation in the last 6 months to the initiation of MiraLax has made a difference. He is also on lubiprostone. He says his stools are dark green but he does not have melena or bleeding. He denies any abdominal pain dysphagia nausea vomiting heartburn or other GI symptoms. He is here with a care assistant from [facility] who confirms  No Known Allergies Outpatient Prescriptions Prior to Visit  Medication Sig Dispense Refill  . acetaminophen (TYLENOL) 325 MG tablet Take 650 mg by mouth 2 (two) times daily.       Marland Kitchen amLODipine (NORVASC) 2.5 MG tablet Take 2.5 mg by mouth daily. Hold for BP<100/60 and HR <60      . aspirin 81 MG tablet Take 81 mg by mouth daily.      . cetirizine (ZYRTEC) 10 MG tablet Take 10 mg by mouth daily.      . dorzolamide (TRUSOPT) 2 % ophthalmic solution Place 1 drop into both eyes 2 (two) times daily.      Marland Kitchen esomeprazole (NEXIUM) 40 MG capsule Take 40 mg by mouth daily at 12 noon.      . ferrous sulfate 325 (65 FE) MG tablet Take 325 mg by mouth 2 (two) times daily with a meal.      . fluticasone (FLONASE) 50 MCG/ACT nasal spray Place 1 spray into both nostrils daily.      . furosemide (LASIX) 40 MG tablet Take 40 mg by mouth daily.      . insulin aspart (NOVOLOG) 100 UNIT/ML injection Inject 10 Units into the skin 2 (two) times daily. With lunch and dinner      . insulin aspart (NOVOLOG) 100 UNIT/ML injection Inject 4 Units into the skin every morning.      . insulin aspart (NOVOLOG) 100 UNIT/ML injection Inject 5  Units into the skin 3 (three) times daily with meals as needed for high blood sugar (give additional 5 units if CBG >150).      . insulin detemir (LEVEMIR) 100 UNIT/ML injection Inject 50 Units into the skin at bedtime.      Marland Kitchen lisinopril (PRINIVIL,ZESTRIL) 5 MG tablet Take 5 mg by mouth daily.      Marland Kitchen LORazepam (ATIVAN) 0.5 MG tablet Take one tablet by mouth every 6 hours as needed  120 tablet  5  . lubiprostone (AMITIZA) 24 MCG capsule Take 24 mcg by mouth 2 (two) times daily with a meal.      . polyethylene glycol (MIRALAX / GLYCOLAX) packet Take 17 g by mouth daily.      . pravastatin (PRAVACHOL) 20 MG tablet Take 20 mg by mouth daily.      . sertraline (ZOLOFT) 100 MG tablet Take 100 mg by mouth daily.      . tamsulosin (FLOMAX) 0.4 MG CAPS capsule Take 0.4 mg by mouth daily.      . travoprost, benzalkonium, (TRAVATAN) 0.004 % ophthalmic solution Place 1 drop into both eyes at bedtime.      Marland Kitchen  traZODone (DESYREL) 50 MG tablet Take 50 mg by mouth at bedtime.       No facility-administered medications prior to visit.   Past Medical History  Diagnosis Date  . Hemiplegia following CVA (cerebrovascular accident)     R side weakness  . Hypertension   . Hyperlipidemia   . Diabetes mellitus without complication   . GERD (gastroesophageal reflux disease)   . Urinary incontinence   . Iron deficiency anemia   . Glaucoma    Past Surgical History  Procedure Laterality Date  . Circumcision    . Cataract extraction Left   . Tonsillectomy     History   Social History  . Marital Status: Widowed                 Social History Main Topics  . Smoking status: Former Smoker  . Smokeless tobacco: Never Used  . Alcohol Use: No  . Drug Use: No  . Sexual Activity: No          Social History Narrative   Resides in [facility]. One of his sons is in charge of the [place] [employer] and that is why the patient moved here from [place],  [place].    Family History  Problem Relation Age of Onset  . Colon cancer Neg Hx   . Throat cancer Neg Hx   . Diabetes Maternal Grandfather   . Heart disease Neg Hx   . Kidney disease Father   . Liver disease Neg Hx         Review of Systems     Objective:   Physical Exam General:  Well-developed, well-nourished and in no acute distress - but chronically ill black man ENT:   Mouth and posterior pharynx free of lesions. + missing most teeth Lungs: Coarse BS to auscultation bilaterally. Heart:  S1S2, no rubs, murmurs, gallops. Abdomen:  soft, non-tender, no hepatosplenomegaly, hernia, or mass and BS+.  Rectal: deferred Extremities:   no edema Neuro:  A&O x 3. R hemiplegia Psych:  appropriate mood and  Affect.   Data Reviewed: CBC, other 05/2013 labs - scanned in    Assessment & Plan:   1. Microcytic anemia   2. Unspecified constipation    I appreciate the opportunity to care for this patient. CC: REED, TIFFANY, DO  

## 2013-08-26 ENCOUNTER — Encounter (HOSPITAL_COMMUNITY): Payer: Self-pay | Admitting: Internal Medicine

## 2013-08-27 ENCOUNTER — Encounter: Payer: Self-pay | Admitting: Internal Medicine

## 2013-08-27 NOTE — Progress Notes (Signed)
Quick Note:  Large 1/4 circumference tubular adenoma - repeat colonoscopy recall July 2015 ______

## 2013-08-31 ENCOUNTER — Encounter: Payer: Self-pay | Admitting: Internal Medicine

## 2013-08-31 NOTE — Progress Notes (Signed)
This encounter was created in error - please disregard.

## 2013-09-16 ENCOUNTER — Encounter: Payer: Self-pay | Admitting: Nurse Practitioner

## 2013-09-16 ENCOUNTER — Non-Acute Institutional Stay (SKILLED_NURSING_FACILITY): Payer: Medicare Other | Admitting: Nurse Practitioner

## 2013-09-16 DIAGNOSIS — N400 Enlarged prostate without lower urinary tract symptoms: Secondary | ICD-10-CM

## 2013-09-16 DIAGNOSIS — I69359 Hemiplegia and hemiparesis following cerebral infarction affecting unspecified side: Secondary | ICD-10-CM

## 2013-09-16 DIAGNOSIS — I10 Essential (primary) hypertension: Secondary | ICD-10-CM

## 2013-09-16 DIAGNOSIS — D509 Iron deficiency anemia, unspecified: Secondary | ICD-10-CM

## 2013-09-16 DIAGNOSIS — E119 Type 2 diabetes mellitus without complications: Secondary | ICD-10-CM

## 2013-09-16 DIAGNOSIS — K5900 Constipation, unspecified: Secondary | ICD-10-CM

## 2013-09-16 DIAGNOSIS — I69959 Hemiplegia and hemiparesis following unspecified cerebrovascular disease affecting unspecified side: Secondary | ICD-10-CM

## 2013-09-16 DIAGNOSIS — E785 Hyperlipidemia, unspecified: Secondary | ICD-10-CM

## 2013-09-16 DIAGNOSIS — D126 Benign neoplasm of colon, unspecified: Secondary | ICD-10-CM

## 2013-09-16 NOTE — Progress Notes (Signed)
Patient ID: Derek Crawford, male   DOB: Jun 10, 1933, 80 y.o.   MRN: [id number]    Nursing Home Location:  [place] of Service: SNF (31)  PCP: REED, TIFFANY, DO  No Known Allergies  Chief Complaint  Patient presents with  . Medical Managment of Chronic Issues    HPI:  80 y.o. male who is s/p CVA with R hemiparesis who had declined and not able to stay in ALF; pt with a pmh of uncontrolled DM, constipation, iron def anemia and hem pos stool, htn, hyperlipidemia, bph, CAD, GERD who is being seen today for routine follow up on chronic conditions. Pt without complaints and staff without concerns. Pt has done well in current setting.   Review of Systems:  Review of Systems  Constitutional: Negative for malaise/fatigue.  Respiratory: Negative for cough and shortness of breath.   Cardiovascular: Negative for chest pain and leg swelling.  Gastrointestinal: Negative for abdominal pain, diarrhea and constipation.  Genitourinary: Negative for dysuria, urgency and frequency.  Musculoskeletal: Negative for myalgias.  Skin: Negative.   Neurological: Negative for dizziness, focal weakness, weakness and headaches.  Psychiatric/Behavioral: Positive for memory loss. Negative for depression. The patient is not nervous/anxious.        Staff reports some agitation at times but is easily re-directed     Past Medical History  Diagnosis Date  . Hemiplegia following CVA (cerebrovascular accident)     R side weakness  . Hypertension   . Hyperlipidemia   . Diabetes mellitus without complication   . GERD (gastroesophageal reflux disease)   . Urinary incontinence   . Iron deficiency anemia   . Glaucoma    Past Surgical History  Procedure Laterality Date  . Circumcision    . Cataract extraction Left   . Tonsillectomy    . Esophagogastroduodenoscopy N/A 08/23/2013    Procedure: ESOPHAGOGASTRODUODENOSCOPY (EGD);  Surgeon: Gatha Mayer, MD;  Location: [facility];   Service: Endoscopy;  Laterality: N/A;  . Colonoscopy N/A 08/23/2013    Procedure: COLONOSCOPY;  Surgeon: Gatha Mayer, MD;  Location: [facility];  Service: Endoscopy;  Laterality: N/A;   Social History:   reports that he has quit smoking. He has never used smokeless tobacco. He reports that he does not drink alcohol or use illicit drugs.  Family History  Problem Relation Age of Onset  . Colon cancer Neg Hx   . Throat cancer Neg Hx   . Diabetes Maternal Grandfather   . Heart disease Neg Hx   . Kidney disease Father   . Liver disease Neg Hx     Medications: Patient's Medications  New Prescriptions   No medications on file  Previous Medications   ACETAMINOPHEN (TYLENOL) 325 MG TABLET    Take 650 mg by mouth 2 (two) times daily.    AMLODIPINE (NORVASC) 2.5 MG TABLET    Take 2.5 mg by mouth daily. Hold for BP<100/60 and HR <60   ASPIRIN 81 MG TABLET    Take 1 tablet (81 mg total) by mouth daily. HOLD UNTIL 09/06/13 and RESTART 09/07/13   CETIRIZINE (ZYRTEC) 10 MG TABLET    Take 10 mg by mouth daily.   DIVALPROEX (DEPAKOTE) 250 MG DR TABLET    Take 250 mg by mouth 2 (two) times daily.   DORZOLAMIDE (TRUSOPT) 2 % OPHTHALMIC SOLUTION    Place 1 drop into both eyes 2 (two) times daily.   ESOMEPRAZOLE (NEXIUM) 40 MG CAPSULE    Take 40 mg  by mouth daily at 12 noon.   FERROUS SULFATE 325 (65 FE) MG TABLET    Take 325 mg by mouth 2 (two) times daily with a meal.   FLUTICASONE (FLONASE) 50 MCG/ACT NASAL SPRAY    Place 1 spray into both nostrils daily.   FUROSEMIDE (LASIX) 40 MG TABLET    Take 40 mg by mouth daily.   INSULIN ASPART (NOVOLOG) 100 UNIT/ML INJECTION    Inject 10 Units into the skin 2 (two) times daily. With lunch and dinner   INSULIN ASPART (NOVOLOG) 100 UNIT/ML INJECTION    Inject 4 Units into the skin every morning.   INSULIN ASPART (NOVOLOG) 100 UNIT/ML INJECTION    Inject 5 Units into the skin 3 (three) times daily with meals as needed for high blood sugar (give additional 5  units if CBG >150).   INSULIN DETEMIR (LEVEMIR) 100 UNIT/ML INJECTION    Inject 50 Units into the skin at bedtime.   LISINOPRIL (PRINIVIL,ZESTRIL) 5 MG TABLET    Take 5 mg by mouth daily.   LUBIPROSTONE (AMITIZA) 24 MCG CAPSULE    Take 24 mcg by mouth 2 (two) times daily with a meal.   POLYETHYLENE GLYCOL (MIRALAX / GLYCOLAX) PACKET    Take 17 g by mouth daily.   PRAVASTATIN (PRAVACHOL) 20 MG TABLET    Take 20 mg by mouth daily.   SERTRALINE (ZOLOFT) 100 MG TABLET    Take 100 mg by mouth daily.   TAMSULOSIN (FLOMAX) 0.4 MG CAPS CAPSULE    Take 0.4 mg by mouth daily.   TRAVOPROST, BENZALKONIUM, (TRAVATAN) 0.004 % OPHTHALMIC SOLUTION    Place 1 drop into both eyes at bedtime.   TRAZODONE (DESYREL) 50 MG TABLET    Take 50 mg by mouth at bedtime.  Modified Medications   No medications on file  Discontinued Medications   LORAZEPAM (ATIVAN) 0.5 MG TABLET    Take one tablet by mouth every 6 hours as needed     Physical Exam:  Filed Vitals:   09/16/13 1409  BP: 111/61  Pulse: 76  Temp: 97.1 F (36.2 C)  Resp: 20     Physical Exam  Constitutional: He is well-developed, well-nourished, and in no distress.  HENT:  Mouth/Throat: Oropharynx is clear and moist. No oropharyngeal exudate.  Eyes: Conjunctivae and EOM are normal. Pupils are equal, round, and reactive to light.  Neck: Normal range of motion. Neck supple. No thyromegaly present.  Cardiovascular: Normal rate, regular rhythm and normal heart sounds.   Pulmonary/Chest: Effort normal and breath sounds normal.  Abdominal: Soft. Bowel sounds are normal. He exhibits no distension.  Musculoskeletal: He exhibits edema (right lower ext). He exhibits no tenderness.  Self propels in [facility]  Neurological: He is alert.  Right sided weakness  Skin: Skin is warm and dry.    Labs reviewed:    Recent Labs  08/07/13 1118  WBC 9.2  NEUTROABS 6.4  HGB 13.9  HCT 44.1  MCV 76.3*  PLT 237.0    Assessment/Plan 1. Hypertension -controlled  on norvasc, lasix, lisinopril -will follow up cmp   2. Unspecified constipation -stable on amitiza and miralax  3. Type II or unspecified type diabetes mellitus without mention of complication, not stated as uncontrolled -currently on levemir and novolog standing order and SSI; will follow up A1c at this time  4. Hemiplegia following CVA (cerebrovascular accident) -currently on asa  5. Iron deficiency anemia -will follow up cbc at this time -conts on iron 6. Other and  unspecified hyperlipidemia Will get fasting lipids, pt on Pravachol currently   7. Large Tubular adenoma right colon -following with GI, S/p colonoscopy with large ascending polyp removed (incomplete likely) - Tubular adenoma - PLAN is to repeat colonoscopy 4- 6 months to enure complete removal  8. BPH -currently on flomax daily  9. Anxiety -on zoloft, depakote and trazodone for sleep

## 2013-10-04 ENCOUNTER — Encounter (HOSPITAL_COMMUNITY): Payer: Self-pay | Admitting: Emergency Medicine

## 2013-10-04 ENCOUNTER — Emergency Department (HOSPITAL_COMMUNITY)
Admission: EM | Admit: 2013-10-04 | Discharge: 2013-10-05 | Disposition: A | Discharge status: Home / Self Care | Payer: Medicare Other | Attending: Emergency Medicine | Admitting: Emergency Medicine

## 2013-10-04 DIAGNOSIS — IMO0002 Reserved for concepts with insufficient information to code with codable children: Secondary | ICD-10-CM | POA: Insufficient documentation

## 2013-10-04 DIAGNOSIS — R32 Unspecified urinary incontinence: Secondary | ICD-10-CM

## 2013-10-04 DIAGNOSIS — E785 Hyperlipidemia, unspecified: Secondary | ICD-10-CM | POA: Insufficient documentation

## 2013-10-04 DIAGNOSIS — Z87891 Personal history of nicotine dependence: Secondary | ICD-10-CM | POA: Insufficient documentation

## 2013-10-04 DIAGNOSIS — K219 Gastro-esophageal reflux disease without esophagitis: Secondary | ICD-10-CM | POA: Insufficient documentation

## 2013-10-04 DIAGNOSIS — E119 Type 2 diabetes mellitus without complications: Secondary | ICD-10-CM | POA: Insufficient documentation

## 2013-10-04 DIAGNOSIS — I69959 Hemiplegia and hemiparesis following unspecified cerebrovascular disease affecting unspecified side: Secondary | ICD-10-CM | POA: Insufficient documentation

## 2013-10-04 DIAGNOSIS — I10 Essential (primary) hypertension: Secondary | ICD-10-CM | POA: Insufficient documentation

## 2013-10-04 DIAGNOSIS — D509 Iron deficiency anemia, unspecified: Secondary | ICD-10-CM | POA: Insufficient documentation

## 2013-10-04 DIAGNOSIS — Z7982 Long term (current) use of aspirin: Secondary | ICD-10-CM | POA: Insufficient documentation

## 2013-10-04 DIAGNOSIS — Z794 Long term (current) use of insulin: Secondary | ICD-10-CM | POA: Insufficient documentation

## 2013-10-04 DIAGNOSIS — Z79899 Other long term (current) drug therapy: Secondary | ICD-10-CM | POA: Insufficient documentation

## 2013-10-04 DIAGNOSIS — H409 Unspecified glaucoma: Secondary | ICD-10-CM | POA: Insufficient documentation

## 2013-10-04 LAB — URINALYSIS, ROUTINE W REFLEX MICROSCOPIC
Bilirubin Urine: NEGATIVE
GLUCOSE, UA: NEGATIVE mg/dL
Hgb urine dipstick: NEGATIVE
Ketones, ur: NEGATIVE mg/dL
LEUKOCYTES UA: NEGATIVE
NITRITE: NEGATIVE
PROTEIN: NEGATIVE mg/dL
Specific Gravity, Urine: 1.011 (ref 1.005–1.030)
UROBILINOGEN UA: 0.2 mg/dL (ref 0.0–1.0)
pH: 6 (ref 5.0–8.0)

## 2013-10-04 LAB — CBC WITH DIFFERENTIAL/PLATELET
Basophils Absolute: 0 10*3/uL (ref 0.0–0.1)
Basophils Relative: 0 % (ref 0–1)
EOS PCT: 2 % (ref 0–5)
Eosinophils Absolute: 0.2 10*3/uL (ref 0.0–0.7)
HEMATOCRIT: 42.1 % (ref 39.0–52.0)
HEMOGLOBIN: 14 g/dL (ref 13.0–17.0)
Lymphocytes Relative: 25 % (ref 12–46)
Lymphs Abs: 2.1 10*3/uL (ref 0.7–4.0)
MCH: 26.1 pg (ref 26.0–34.0)
MCHC: 33.3 g/dL (ref 30.0–36.0)
MCV: 78.5 fL (ref 78.0–100.0)
MONOS PCT: 10 % (ref 3–12)
Monocytes Absolute: 0.8 10*3/uL (ref 0.1–1.0)
NEUTROS ABS: 5.2 10*3/uL (ref 1.7–7.7)
Neutrophils Relative %: 63 % (ref 43–77)
PLATELETS: 187 10*3/uL (ref 150–400)
RBC: 5.36 MIL/uL (ref 4.22–5.81)
RDW: 14.3 % (ref 11.5–15.5)
WBC: 8.3 10*3/uL (ref 4.0–10.5)

## 2013-10-04 LAB — COMPREHENSIVE METABOLIC PANEL
ALBUMIN: 3.3 g/dL — AB (ref 3.5–5.2)
ALT: 15 U/L (ref 0–53)
AST: 16 U/L (ref 0–37)
Alkaline Phosphatase: 69 U/L (ref 39–117)
BUN: 24 mg/dL — AB (ref 6–23)
CO2: 22 mEq/L (ref 19–32)
Calcium: 8.6 mg/dL (ref 8.4–10.5)
Chloride: 98 mEq/L (ref 96–112)
Creatinine, Ser: 1.41 mg/dL — ABNORMAL HIGH (ref 0.50–1.35)
GFR calc Af Amer: 53 mL/min — ABNORMAL LOW (ref >90)
GFR calc non Af Amer: 46 mL/min — ABNORMAL LOW (ref >90)
Glucose, Bld: 124 mg/dL — ABNORMAL HIGH (ref 70–99)
Potassium: 4.9 mEq/L (ref 3.7–5.3)
Sodium: 133 mEq/L — ABNORMAL LOW (ref 137–147)
Total Bilirubin: 0.2 mg/dL — ABNORMAL LOW (ref 0.3–1.2)
Total Protein: 7.6 g/dL (ref 6.0–8.3)

## 2013-10-04 LAB — RAPID URINE DRUG SCREEN, HOSP PERFORMED
Amphetamines: NOT DETECTED
BARBITURATES: NOT DETECTED
Benzodiazepines: NOT DETECTED
Cocaine: NOT DETECTED
OPIATES: NOT DETECTED
Tetrahydrocannabinol: NOT DETECTED

## 2013-10-04 LAB — ETHANOL: Alcohol, Ethyl (B): <11 mg/dL (ref 0–11)
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
## 2013-10-04 NOTE — ED Notes (Signed)
Bed: BP11 Expected date:  Expected time:  Means of arrival:  Comments: EMS eval from [facility]

## 2013-10-04 NOTE — ED Provider Notes (Signed)
CSN: [id number]     Arrival date & time 10/04/13  1955 History   First MD Initiated Contact with Patient 10/04/13 2025     Chief Complaint  Patient presents with  . Medical Clearance    HPI Patient presents to the emergency room for psychiatric evaluation. According to EMS, staff indicated that the patient has been making inappropriate sexual comments and touching other patients inappropriately over the last few days. Patient denies any of these behaviors. He states he has been asking for help. He is unable to urinate without assistance because of his immobility. He is called to ask for help from the nursing staff and they will not respond to him. Patient states that the people are lying about him. He denies any medical complaints. He's not had any trouble with headache chest pain no vomiting or diarrhea Past Medical History  Diagnosis Date  . Hemiplegia following CVA (cerebrovascular accident)     R side weakness  . Hypertension   . Hyperlipidemia   . Diabetes mellitus without complication   . GERD (gastroesophageal reflux disease)   . Urinary incontinence   . Iron deficiency anemia   . Glaucoma    Past Surgical History  Procedure Laterality Date  . Circumcision    . Cataract extraction Left   . Tonsillectomy    . Esophagogastroduodenoscopy N/A 08/23/2013    Procedure: ESOPHAGOGASTRODUODENOSCOPY (EGD);  Surgeon: Gatha Mayer, MD;  Location: [facility];  Service: Endoscopy;  Laterality: N/A;  . Colonoscopy N/A 08/23/2013    Procedure: COLONOSCOPY;  Surgeon: Gatha Mayer, MD;  Location: [facility];  Service: Endoscopy;  Laterality: N/A;   Family History  Problem Relation Age of Onset  . Colon cancer Neg Hx   . Throat cancer Neg Hx   . Diabetes Maternal Grandfather   . Heart disease Neg Hx   . Kidney disease Father   . Liver disease Neg Hx    History  Substance Use Topics  . Smoking status: Former Research scientist (life sciences)  . Smokeless tobacco: Never Used  . Alcohol Use: No    Review  of Systems  All other systems reviewed and are negative.      Allergies  Review of patient's allergies indicates no known allergies.  Home Medications   Current Outpatient Rx  Name  Route  Sig  Dispense  Refill  . acetaminophen (TYLENOL) 325 MG tablet   Oral   Take 650 mg by mouth 2 (two) times daily as needed for mild pain or headache.          Marland Kitchen amLODipine (NORVASC) 2.5 MG tablet   Oral   Take 2.5 mg by mouth daily. Hold for BP<100/60 and HR <60         . aspirin 81 MG tablet   Oral   Take 81 mg by mouth daily.         . cetirizine (ZYRTEC) 10 MG tablet   Oral   Take 10 mg by mouth daily.         . divalproex (DEPAKOTE) 250 MG DR tablet   Oral   Take 250 mg by mouth 2 (two) times daily.         . dorzolamide (TRUSOPT) 2 % ophthalmic solution   Both Eyes   Place 1 drop into both eyes 2 (two) times daily.         Marland Kitchen esomeprazole (NEXIUM) 40 MG capsule   Oral   Take 40 mg by mouth daily at 12 noon.         Marland Kitchen  ferrous sulfate 325 (65 FE) MG tablet   Oral   Take 325 mg by mouth 2 (two) times daily with a meal.         . fluticasone (FLONASE) 50 MCG/ACT nasal spray   Nasal   Place 1 spray into the nose 2 (two) times daily.          . furosemide (LASIX) 40 MG tablet   Oral   Take 40 mg by mouth daily.         . insulin detemir (LEVEMIR) 100 UNIT/ML injection   Subcutaneous   Inject 50 Units into the skin at bedtime.         . insulin lispro (HUMALOG) 100 UNIT/ML injection   Subcutaneous   Inject 4-10 Units into the skin See admin instructions. Inject 4 units in the morning, 10 units at lunch, and 10 units at dinner. May inject an additional 5 units if blood sugar reading is over 150.         Marland Kitchen lisinopril (PRINIVIL,ZESTRIL) 5 MG tablet   Oral   Take 5 mg by mouth daily.         Marland Kitchen LORazepam (ATIVAN) 0.5 MG tablet   Oral   Take 0.5 mg by mouth every 8 (eight) hours as needed for anxiety.         Marland Kitchen lubiprostone (AMITIZA) 24 MCG  capsule   Oral   Take 24 mcg by mouth 2 (two) times daily with a meal.         . polyethylene glycol (MIRALAX / GLYCOLAX) packet   Oral   Take 17 g by mouth daily.         . pravastatin (PRAVACHOL) 20 MG tablet   Oral   Take 20 mg by mouth daily.         . sertraline (ZOLOFT) 100 MG tablet   Oral   Take 100 mg by mouth daily.         . tamsulosin (FLOMAX) 0.4 MG CAPS capsule   Oral   Take 0.4 mg by mouth daily.         . travoprost, benzalkonium, (TRAVATAN) 0.004 % ophthalmic solution   Both Eyes   Place 1 drop into both eyes at bedtime.         . traZODone (DESYREL) 50 MG tablet   Oral   Take 50 mg by mouth at bedtime.          BP 151/81  Pulse 86  Temp(Src) 98.1 F (36.7 C) (Oral)  Resp 18  SpO2 97% Physical Exam  Nursing note and vitals reviewed. Constitutional: He appears well-developed and well-nourished. No distress.  Smells of urine  HENT:  Head: Normocephalic and atraumatic.  Right Ear: External ear normal.  Left Ear: External ear normal.  Eyes: Conjunctivae are normal. Right eye exhibits no discharge. Left eye exhibits no discharge. No scleral icterus.  Neck: Neck supple. No tracheal deviation present.  Cardiovascular: Normal rate, regular rhythm and intact distal pulses.   Pulmonary/Chest: Effort normal and breath sounds normal. No stridor. No respiratory distress. He has no wheezes. He has no rales.  Abdominal: Soft. Bowel sounds are normal. He exhibits no distension. There is no tenderness. There is no rebound and no guarding.  Musculoskeletal: He exhibits edema. He exhibits no tenderness.  Neurological: He is alert. He displays atrophy. No cranial nerve deficit (no facial droop, extraocular movements intact, no slurred speech) or sensory deficit. He exhibits abnormal muscle tone. He displays no seizure  activity. Coordination normal.  Weakness bilateral lower extremities, right worse than left, patient is unable to lift his legs off the bed   Skin: Skin is warm and dry. No rash noted. He is not diaphoretic.  Psychiatric: He has a normal mood and affect.    ED Course  Procedures (including critical care time) Labs Review Labs Reviewed  COMPREHENSIVE METABOLIC PANEL - Abnormal; Notable for the following:    Sodium 133 (*)    Glucose, Bld 124 (*)    BUN 24 (*)    Creatinine, Ser 1.41 (*)    Albumin 3.3 (*)    Total Bilirubin 0.2 (*)    GFR calc non Af Amer 46 (*)    GFR calc Af Amer 53 (*)    All other components within normal limits  CBC WITH DIFFERENTIAL  URINE RAPID DRUG SCREEN (HOSP PERFORMED)  ETHANOL  URINALYSIS, ROUTINE W REFLEX MICROSCOPIC   Imaging Review No results found.   EKG Interpretation None      MDM   Final diagnoses:  Urinary incontinence    Pt with no acute medical issues this evening.  He has remained calm and lucid in the ED.  No SI or HI. Has not been inappropriate with staff here.  Pt may benefit from psychiatric evaluation but no emergent need for it at this point.  Psychiatrist is not available at this time of the evening.  No need for ACT assessment as pt is not in need of psychiatric admission.    Discussed with staff at [facility]. Pt may return    Kathalene Frames, MD 10/04/13 [phone]

## 2013-10-05 NOTE — ED Notes (Signed)
Awaiting for PTAR, report given to receiving facility.

## 2013-10-21 ENCOUNTER — Non-Acute Institutional Stay (SKILLED_NURSING_FACILITY): Payer: Medicare Other | Admitting: Nurse Practitioner

## 2013-10-21 DIAGNOSIS — E785 Hyperlipidemia, unspecified: Secondary | ICD-10-CM

## 2013-10-21 DIAGNOSIS — K219 Gastro-esophageal reflux disease without esophagitis: Secondary | ICD-10-CM

## 2013-10-21 DIAGNOSIS — I10 Essential (primary) hypertension: Secondary | ICD-10-CM

## 2013-10-21 DIAGNOSIS — E119 Type 2 diabetes mellitus without complications: Secondary | ICD-10-CM

## 2013-10-21 DIAGNOSIS — D509 Iron deficiency anemia, unspecified: Secondary | ICD-10-CM

## 2013-10-21 DIAGNOSIS — I6350 Cerebral infarction due to unspecified occlusion or stenosis of unspecified cerebral artery: Secondary | ICD-10-CM

## 2013-10-21 DIAGNOSIS — K5900 Constipation, unspecified: Secondary | ICD-10-CM

## 2013-10-21 DIAGNOSIS — F29 Unspecified psychosis not due to a substance or known physiological condition: Secondary | ICD-10-CM

## 2013-10-21 NOTE — Progress Notes (Signed)
Patient ID: Derek Crawford, male   DOB: 1933-04-09, 80 y.o.   MRN: [id number]    Nursing Home Location:  [facility]  PCP: Cyndee Brightly, MD  No Known Allergies  Chief Complaint  Patient presents with  . Medical Managment of Chronic Issues    HPI:  80 y.o. male who is s/p CVA with R hemiparesis with a pmh of uncontrolled DM, constipation, iron def anemia and hem pos stool, htn, hyperlipidemia, bph, CAD, GERD who is being seen today for routine follow up on chronic conditions. In the last month pt has had to go to the ED for psychiatric evaluation due to being sexually inappropriate with staff and other pts in the facility. Pt now requires 1:1 sitter per facility protocol, otherwse pt has no complaints   Review of Systems:  Review of Systems  Constitutional: Negative for fever, chills and malaise/fatigue.  Respiratory: Negative for cough and shortness of breath.   Cardiovascular: Negative for chest pain and leg swelling.  Gastrointestinal: Negative for abdominal pain, diarrhea and constipation.  Genitourinary: Negative for dysuria, urgency and frequency.  Musculoskeletal: Negative for myalgias.  Skin: Negative.   Neurological: Negative for dizziness, focal weakness, weakness and headaches.  Psychiatric/Behavioral: Positive for memory loss. Negative for depression, suicidal ideas and hallucinations. The patient is not nervous/anxious and does not have insomnia.      Past Medical History  Diagnosis Date  . Hemiplegia following CVA (cerebrovascular accident)     R side weakness  . Hypertension   . Hyperlipidemia   . Diabetes mellitus without complication   . GERD (gastroesophageal reflux disease)   . Urinary incontinence   . Iron deficiency anemia   . Glaucoma    Past Surgical History  Procedure Laterality Date  . Circumcision    . Cataract extraction Left   . Tonsillectomy    . Esophagogastroduodenoscopy N/A 08/23/2013      Procedure: ESOPHAGOGASTRODUODENOSCOPY (EGD);  Surgeon: Gatha Mayer, MD;  Location: [facility];  Service: Endoscopy;  Laterality: N/A;  . Colonoscopy N/A 08/23/2013    Procedure: COLONOSCOPY;  Surgeon: Gatha Mayer, MD;  Location: [facility];  Service: Endoscopy;  Laterality: N/A;   Social History:   reports that he has quit smoking. He has never used smokeless tobacco. He reports that he does not drink alcohol or use illicit drugs.  Family History  Problem Relation Age of Onset  . Colon cancer Neg Hx   . Throat cancer Neg Hx   . Diabetes Maternal Grandfather   . Heart disease Neg Hx   . Kidney disease Father   . Liver disease Neg Hx     Medications: Patient's Medications  New Prescriptions   No medications on file  Previous Medications   ACETAMINOPHEN (TYLENOL) 325 MG TABLET    Take 650 mg by mouth 2 (two) times daily as needed for mild pain or headache.    AMLODIPINE (NORVASC) 2.5 MG TABLET    Take 2.5 mg by mouth daily. Hold for BP<100/60 and HR <60   ASPIRIN 81 MG TABLET    Take 81 mg by mouth daily.   CETIRIZINE (ZYRTEC) 10 MG TABLET    Take 10 mg by mouth daily.   DIVALPROEX (DEPAKOTE) 250 MG DR TABLET    Take 250 mg by mouth 2 (two) times daily.   DORZOLAMIDE (TRUSOPT) 2 % OPHTHALMIC SOLUTION    Place 1 drop into both eyes 2 (two) times daily.  ESOMEPRAZOLE (NEXIUM) 40 MG CAPSULE    Take 40 mg by mouth daily at 12 noon.   FERROUS SULFATE 325 (65 FE) MG TABLET    Take 325 mg by mouth 2 (two) times daily with a meal.   FLUTICASONE (FLONASE) 50 MCG/ACT NASAL SPRAY    Place 1 spray into the nose 2 (two) times daily.    FUROSEMIDE (LASIX) 40 MG TABLET    Take 40 mg by mouth daily.   INSULIN DETEMIR (LEVEMIR) 100 UNIT/ML INJECTION    Inject 50 Units into the skin at bedtime.   INSULIN LISPRO (HUMALOG) 100 UNIT/ML INJECTION    Inject 4-10 Units into the skin See admin instructions. Inject 4 units in the morning, 10 units at lunch, and 10 units at dinner. May inject an  additional 5 units if blood sugar reading is over 150.   LISINOPRIL (PRINIVIL,ZESTRIL) 5 MG TABLET    Take 5 mg by mouth daily.   LORAZEPAM (ATIVAN) 0.5 MG TABLET    Take 0.5 mg by mouth every 8 (eight) hours as needed for anxiety.   LUBIPROSTONE (AMITIZA) 24 MCG CAPSULE    Take 24 mcg by mouth 2 (two) times daily with a meal.   POLYETHYLENE GLYCOL (MIRALAX / GLYCOLAX) PACKET    Take 17 g by mouth daily.   PRAVASTATIN (PRAVACHOL) 20 MG TABLET    Take 20 mg by mouth daily.   RISPERIDONE (RISPERDAL) 0.5 MG TABLET    Take 0.5 mg by mouth 2 (two) times daily.   SERTRALINE (ZOLOFT) 100 MG TABLET    Take 100 mg by mouth daily.   TAMSULOSIN (FLOMAX) 0.4 MG CAPS CAPSULE    Take 0.4 mg by mouth daily.   TRAVOPROST, BENZALKONIUM, (TRAVATAN) 0.004 % OPHTHALMIC SOLUTION    Place 1 drop into both eyes at bedtime.   TRAZODONE (DESYREL) 50 MG TABLET    Take 50 mg by mouth at bedtime.  Modified Medications   No medications on file  Discontinued Medications   No medications on file     Physical Exam: Physical Exam  Constitutional: He is well-developed, well-nourished, and in no distress.  HENT:  Head: Normocephalic and atraumatic.  Mouth/Throat: Oropharynx is clear and moist. No oropharyngeal exudate.  Eyes: Conjunctivae and EOM are normal. Pupils are equal, round, and reactive to light.  Neck: Normal range of motion. Neck supple. No thyromegaly present.  Cardiovascular: Normal rate, regular rhythm and normal heart sounds.   Pulmonary/Chest: Effort normal and breath sounds normal.  Abdominal: Soft. Bowel sounds are normal. He exhibits no distension.  Musculoskeletal: He exhibits edema (right lower ext). He exhibits no tenderness.  Self propels in [facility]  Neurological: He is alert.  Right sided weakness  Skin: Skin is warm and dry.  Psychiatric: Affect normal.     Filed Vitals:   10/21/13 1059  BP: 106/70  Pulse: 70  Temp: 97.8 F (36.6 C)  Resp: 20      Labs reviewed: Basic Metabolic  Panel:  Recent Labs  10/04/13 2129  NA 133*  K 4.9  CL 98  CO2 22  GLUCOSE 124*  BUN 24*  CREATININE 1.41*  CALCIUM 8.6   Liver Function Tests:  Recent Labs  10/04/13 2129  AST 16  ALT 15  ALKPHOS 69  BILITOT 0.2*  PROT 7.6  ALBUMIN 3.3*   CBC with Diff    Result: 08/19/2013 10:25 AM   ( Status: F )     C WBC 7.0  4.0-10.5 K/uL SLN   RBC 5.31     4.22-5.81 MIL/uL SLN   Hemoglobin 13.0     13.0-17.0 g/dL SLN   Hematocrit 89.7     39.0-52.0 % SLN   MCV 73.6   L 78.0-100.0 fL SLN   MCH 24.5   L 26.0-34.0 pg SLN   MCHC 33.2     30.0-36.0 g/dL SLN   RDW 28.1   H 87.9-76.6 % SLN   Platelet Count 187     150-400 K/uL SLN   Granulocyte % 54     43-77 % SLN   Absolute Gran 3.8     1.7-7.7 K/uL SLN   Lymph % 32     12-46 % SLN   Absolute Lymph 2.2     0.7-4.0 K/uL SLN   Mono % 11     3-12 % SLN   Absolute Mono 0.8     0.1-1.0 K/uL SLN   Eos % 3     0-5 % SLN   Absolute Eos 0.2     0.0-0.7 K/uL SLN   Baso % 0     0-1 % SLN   Absolute Baso 0.0     0.0-0.1 K/uL SLN   Smear Review Criteria for review not met  SLN   Reticulocyte Count (RETIC)    Result: 08/19/2013 10:25 AM   ( Status: F )       % Retic 0.9     0.4-2.3 % SLN   RBC 5.31     4.22-5.81 MIL/uL SLN   ABS Retic 47.8     19.0-186.0 K/uL SLN   Comprehensive Metabolic Panel    Result: 08/19/2013 10:40 AM   ( Status: F )       Sodium 138     135-145 mEq/L SLN   Potassium 4.0     3.5-5.3 mEq/L SLN   Chloride 102     96-112 mEq/L SLN   CO2 24     19-32 mEq/L SLN   Glucose 107   H 70-99 mg/dL SLN   BUN 24   H 6-20 mg/dL SLN   Creatinine 4.65     0.50-1.35 mg/dL SLN   Bilirubin, Total 0.3     0.2-1.2 mg/dL SLN C Alkaline Phosphatase 63     39-117 U/L SLN   AST/SGOT 10     0-37 U/L SLN   ALT/SGPT 10     0-53 U/L SLN   Total Protein 6.9     6.0-8.3 g/dL SLN   Albumin 3.5     8.6-1.1 g/dL SLN   Calcium 9.1     7.1-39.7 mg/dL SLN   Lipid Profile    Result: 08/19/2013 10:40 AM   ( Status: F )       Cholesterol 139       0-200 mg/dL SLN C Triglyceride 705     <150 mg/dL SLN   HDL Cholesterol 37   L >39 mg/dL SLN   Total Chol/HDL Ratio 3.8      Ratio SLN   VLDL Cholesterol (Calc) 23     0-40 mg/dL SLN   LDL Cholesterol (Calc) 79     0-99 mg/dL SLN C Hemoglobin A9V    Result: 08/19/2013 3:23 PM   ( Status: F )       Hemoglobin A1C 7.0   H <5.7 % SLN C Estimated Average Glucose 154   H  Assessment/Plan 1. Hypertension -Patient is stable; continue current regimen. Will monitor and make  changes as necessary.  2. CVA -late effect,  conts ASA  3. GERD -conts on nexium with good relief   4. Unspecified constipation -currently controlled on medications  5. Type II or unspecified type diabetes mellitus without mention of complication, not stated as uncontrolled cbgs reviewed and in appropriate range, conts levemir and humalog   6. Psychosis -due to inappropiate conduct towards staff and other patients Derek Crawford now has 1:1 sitter, also being followed by psych and was started on Risperdal  7. Iron deficiency anemia - hgb stable; will cont iron supplements   8. Hyperlipidemia -LDL at goal, conts pravastatin

## 2013-11-11 ENCOUNTER — Encounter: Payer: Self-pay | Admitting: Nurse Practitioner

## 2013-11-11 ENCOUNTER — Non-Acute Institutional Stay (SKILLED_NURSING_FACILITY): Payer: Medicare Other | Admitting: Nurse Practitioner

## 2013-11-11 DIAGNOSIS — F29 Unspecified psychosis not due to a substance or known physiological condition: Secondary | ICD-10-CM

## 2013-11-11 DIAGNOSIS — K219 Gastro-esophageal reflux disease without esophagitis: Secondary | ICD-10-CM

## 2013-11-11 DIAGNOSIS — B372 Candidiasis of skin and nail: Secondary | ICD-10-CM

## 2013-11-11 DIAGNOSIS — I6990 Unspecified sequelae of unspecified cerebrovascular disease: Secondary | ICD-10-CM

## 2013-11-11 DIAGNOSIS — D509 Iron deficiency anemia, unspecified: Secondary | ICD-10-CM

## 2013-11-11 DIAGNOSIS — E119 Type 2 diabetes mellitus without complications: Secondary | ICD-10-CM

## 2013-11-11 DIAGNOSIS — E785 Hyperlipidemia, unspecified: Secondary | ICD-10-CM

## 2013-11-11 DIAGNOSIS — I10 Essential (primary) hypertension: Secondary | ICD-10-CM

## 2013-11-11 NOTE — Progress Notes (Signed)
Patient ID: Derek Crawford, male   DOB: [birth year], 80 y.o.   MRN: [id number]    Nursing Home Location:  [place] of Service: SNF (31)  PCP: Cyndee Brightly, MD  No Known Allergies  Chief Complaint  Patient presents with  . Medical Management of Chronic Issues    HPI:  80 y.o. male who is s/p CVA with R hemiparesis with a pmh of DM, constipation, iron def anemia and hem pos stool, htn, hyperlipidemia, bph, CAD, GERD who is being seen today for routine follow up on chronic conditions. pt still requires 1:1 sitter per facility protocol due to inappropriate behaviors but has not had any increase or noted behaviors recently; Pt reports uncomfortable feeling in groin area, no itching or drainage  Review of Systems:  Review of Systems  Constitutional: Negative for fever, chills and malaise/fatigue.  Respiratory: Negative for cough and shortness of breath.   Cardiovascular: Negative for chest pain and leg swelling.  Gastrointestinal: Positive for heartburn (occasionally rare). Negative for abdominal pain, diarrhea and constipation.  Genitourinary: Negative for dysuria, urgency and frequency.  Musculoskeletal: Negative for myalgias.  Skin: Negative.  Negative for rash.  Neurological: Negative for dizziness, focal weakness, weakness and headaches.  Psychiatric/Behavioral: Positive for memory loss. Negative for depression, suicidal ideas and hallucinations. The patient is not nervous/anxious and does not have insomnia.      Past Medical History  Diagnosis Date  . Hemiplegia following CVA (cerebrovascular accident)     R side weakness  . Hypertension   . Hyperlipidemia   . Diabetes mellitus without complication   . GERD (gastroesophageal reflux disease)   . Urinary incontinence   . Iron deficiency anemia   . Glaucoma    Past Surgical History  Procedure Laterality Date  . Circumcision    . Cataract extraction Left   . Tonsillectomy    .  Esophagogastroduodenoscopy N/A 08/23/2013    Procedure: ESOPHAGOGASTRODUODENOSCOPY (EGD);  Surgeon: Gatha Mayer, MD;  Location: [facility];  Service: Endoscopy;  Laterality: N/A;  . Colonoscopy N/A 08/23/2013    Procedure: COLONOSCOPY;  Surgeon: Gatha Mayer, MD;  Location: [facility];  Service: Endoscopy;  Laterality: N/A;   Social History:   reports that he has quit smoking. He has never used smokeless tobacco. He reports that he does not drink alcohol or use illicit drugs.  Family History  Problem Relation Age of Onset  . Colon cancer Neg Hx   . Throat cancer Neg Hx   . Diabetes Maternal Grandfather   . Heart disease Neg Hx   . Kidney disease Father   . Liver disease Neg Hx     Medications: Patient's Medications  New Prescriptions   No medications on file  Previous Medications   ACETAMINOPHEN (TYLENOL) 325 MG TABLET    Take 650 mg by mouth 2 (two) times daily as needed for mild pain or headache.    AMLODIPINE (NORVASC) 2.5 MG TABLET    Take 2.5 mg by mouth daily. Hold for BP<100/60 and HR <60   ASPIRIN 81 MG TABLET    Take 81 mg by mouth daily.   CETIRIZINE (ZYRTEC) 10 MG TABLET    Take 10 mg by mouth daily.   DIVALPROEX (DEPAKOTE) 250 MG DR TABLET    Take 250 mg by mouth 2 (two) times daily.   DORZOLAMIDE (TRUSOPT) 2 % OPHTHALMIC SOLUTION    Place 1 drop into both eyes 2 (two) times daily.   ESOMEPRAZOLE (NEXIUM) 40  MG CAPSULE    Take 40 mg by mouth daily at 12 noon.   FERROUS SULFATE 325 (65 FE) MG TABLET    Take 325 mg by mouth 2 (two) times daily with a meal.   FLUTICASONE (FLONASE) 50 MCG/ACT NASAL SPRAY    Place 1 spray into the nose 2 (two) times daily.    FUROSEMIDE (LASIX) 40 MG TABLET    Take 40 mg by mouth daily.   INSULIN DETEMIR (LEVEMIR) 100 UNIT/ML INJECTION    Inject 50 Units into the skin at bedtime.   INSULIN LISPRO (HUMALOG) 100 UNIT/ML INJECTION    Inject 4-10 Units into the skin See admin instructions. Inject 4 units in the morning, 10 units at lunch,  and 10 units at dinner. May inject an additional 5 units if blood sugar reading is over 150.   LISINOPRIL (PRINIVIL,ZESTRIL) 5 MG TABLET    Take 5 mg by mouth daily.   LORAZEPAM (ATIVAN) 0.5 MG TABLET    Take 0.5 mg by mouth every 8 (eight) hours as needed for anxiety.   LUBIPROSTONE (AMITIZA) 24 MCG CAPSULE    Take 24 mcg by mouth 2 (two) times daily with a meal.   POLYETHYLENE GLYCOL (MIRALAX / GLYCOLAX) PACKET    Take 17 g by mouth daily.   PRAVASTATIN (PRAVACHOL) 20 MG TABLET    Take 20 mg by mouth daily.   RISPERIDONE (RISPERDAL) 0.5 MG TABLET    Take 0.5 mg by mouth 2 (two) times daily.   SERTRALINE (ZOLOFT) 100 MG TABLET    Take 100 mg by mouth daily.   TAMSULOSIN (FLOMAX) 0.4 MG CAPS CAPSULE    Take 0.4 mg by mouth daily.   TRAVOPROST, BENZALKONIUM, (TRAVATAN) 0.004 % OPHTHALMIC SOLUTION    Place 1 drop into both eyes at bedtime.   TRAZODONE (DESYREL) 50 MG TABLET    Take 50 mg by mouth at bedtime.  Modified Medications   No medications on file  Discontinued Medications   No medications on file     Physical Exam:  Filed Vitals:   11/11/13 1448  BP: 129/63  Pulse: 80  Temp: 97.1 F (36.2 C)  Resp: 20    Physical Exam  Constitutional: He is well-developed, well-nourished, and in no distress.  HENT:  Head: Normocephalic and atraumatic.  Mouth/Throat: Oropharynx is clear and moist. No oropharyngeal exudate.  Eyes: Conjunctivae and EOM are normal. Pupils are equal, round, and reactive to light.  Neck: Normal range of motion. Neck supple.  Cardiovascular: Normal rate, regular rhythm and normal heart sounds.   Pulmonary/Chest: Effort normal and breath sounds normal.  Abdominal: Soft. Bowel sounds are normal. He exhibits no distension. There is no tenderness.  Musculoskeletal: He exhibits edema (right lower ext). He exhibits no tenderness.  Self propels in [facility]  Neurological: He is alert.  Right sided weakness  Skin: Skin is warm and dry. Rash (faint yeast rash to groin)  noted.  Psychiatric: Affect normal.     Labs reviewed: Basic Metabolic Panel:  Recent Labs  10/04/13 2129  NA 133*  K 4.9  CL 98  CO2 22  GLUCOSE 124*  BUN 24*  CREATININE 1.41*  CALCIUM 8.6   Liver Function Tests:  Recent Labs  10/04/13 2129  AST 16  ALT 15  ALKPHOS 69  BILITOT 0.2*  PROT 7.6  ALBUMIN 3.3*   No results found for this basename: LIPASE, AMYLASE,  in the last 8760 hours No results found for this basename: AMMONIA,  in  the last 8760 hours CBC:  Recent Labs  08/07/13 1118 10/04/13 2129  WBC 9.2 8.3  NEUTROABS 6.4 5.2  HGB 13.9 14.0  HCT 44.1 42.1  MCV 76.3* 78.5  PLT 237.0 187   CBC NO Diff (Complete Blood Count)    Result: 09/18/2013 10:51 AM   ( Status: F )     C WBC 5.8     4.0-10.5 K/uL SLN   RBC 5.48     4.22-5.81 MIL/uL SLN   Hemoglobin 14.0     13.0-17.0 g/dL SLN   Hematocrit 42.2     39.0-52.0 % SLN   MCV 77.0   L 78.0-100.0 fL SLN   MCH 25.5   L 26.0-34.0 pg SLN   MCHC 33.2     30.0-36.0 g/dL SLN   RDW 16.5   H 11.5-15.5 % SLN   Platelet Count 184     150-400 K/uL SLN   Comprehensive Metabolic Panel    Result: 09/18/2013 2:24 PM   ( Status: F )       Sodium 137     135-145 mEq/L SLN   Potassium 4.0     3.5-5.3 mEq/L SLN   Chloride 101     96-112 mEq/L SLN   CO2 24     19-32 mEq/L SLN   Glucose 98     70-99 mg/dL SLN   BUN 21     6-23 mg/dL SLN   Creatinine 1.16     0.50-1.35 mg/dL SLN   Bilirubin, Total 0.3     0.2-1.2 mg/dL SLN   Alkaline Phosphatase 61     39-117 U/L SLN   AST/SGOT 12     0-37 U/L SLN   ALT/SGPT 10     0-53 U/L SLN   Total Protein 7.0     6.0-8.3 g/dL SLN   Albumin 3.5     3.5-5.2 g/dL SLN   Calcium 8.2   L 8.4-10.5 mg/dL SLN   Lipid Profile    Result: 09/18/2013 2:24 PM   ( Status: F )       Cholesterol 130     0-200 mg/dL SLN C Triglyceride 126     <150 mg/dL SLN   HDL Cholesterol 31   L >39 mg/dL SLN   Total Chol/HDL Ratio 4.2      Ratio SLN   VLDL Cholesterol (Calc) 25     0-40 mg/dL SLN   LDL  Cholesterol (Calc) 74     0-99 mg/dL SLN C TSH, Ultrasensitive    Result: 09/18/2013 12:39 PM   ( Status: F )       TSH 2.032     0.350-4.500 uIU/mL SLN   Hemoglobin A1C    Result: 09/18/2013 2:19 PM   ( Status: F )       Hemoglobin A1C 7.5   H <5.7 % SLN C Estimated Average Glucose 169   H <117 mg/dL SLN Assessment/Plan 1. GERD -stable on nexium  2. Hypertension -well controlled on current medications  3. Type II or unspecified type diabetes mellitus without mention of complication, not stated as uncontrolled -A1c at goal for age and co-morbities, cbgs reviewed, will cont current medications   4. Psychosis -followed by psych, conts risperdal -no recent inappropriate behaviors   5. Iron deficiency anemia hgb stable   6. Yeast dermatitis -pt with episodes of incontitence, refuses to wear brief -will have staff apply nystatin BID and work on personal care.   7. 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 Other and unspecified hyperlipidemia LDL at [place] on pravastatin

## 2013-11-15 ENCOUNTER — Encounter: Payer: Self-pay | Admitting: Internal Medicine

## 2013-11-22 ENCOUNTER — Encounter: Payer: Self-pay | Admitting: Adult Health

## 2013-12-09 ENCOUNTER — Non-Acute Institutional Stay (SKILLED_NURSING_FACILITY): Payer: Medicare Other | Admitting: Nurse Practitioner

## 2013-12-09 DIAGNOSIS — J309 Allergic rhinitis, unspecified: Secondary | ICD-10-CM

## 2013-12-09 DIAGNOSIS — I6990 Unspecified sequelae of unspecified cerebrovascular disease: Secondary | ICD-10-CM

## 2013-12-09 DIAGNOSIS — K5900 Constipation, unspecified: Secondary | ICD-10-CM

## 2013-12-09 DIAGNOSIS — K219 Gastro-esophageal reflux disease without esophagitis: Secondary | ICD-10-CM

## 2013-12-09 DIAGNOSIS — I10 Essential (primary) hypertension: Secondary | ICD-10-CM

## 2013-12-09 DIAGNOSIS — F3289 Other specified depressive episodes: Secondary | ICD-10-CM

## 2013-12-09 DIAGNOSIS — E119 Type 2 diabetes mellitus without complications: Secondary | ICD-10-CM

## 2013-12-09 DIAGNOSIS — N400 Enlarged prostate without lower urinary tract symptoms: Secondary | ICD-10-CM

## 2013-12-09 DIAGNOSIS — R4589 Other symptoms and signs involving emotional state: Secondary | ICD-10-CM | POA: Insufficient documentation

## 2013-12-09 DIAGNOSIS — F329 Major depressive disorder, single episode, unspecified: Secondary | ICD-10-CM

## 2013-12-09 DIAGNOSIS — D509 Iron deficiency anemia, unspecified: Secondary | ICD-10-CM

## 2013-12-09 DIAGNOSIS — F29 Unspecified psychosis not due to a substance or known physiological condition: Secondary | ICD-10-CM

## 2013-12-09 NOTE — Progress Notes (Signed)
Patient ID: Derek Crawford, male   DOB: Jun 28, 1933, 80 y.o.   MRN: [id number]    Nursing Home Location:  [place] of Service: SNF (31)  PCP: Cyndee Brightly, MD  No Known Allergies  Chief Complaint  Patient presents with  . Medical Management of Chronic Issues    HPI:  80 y.o. male who is s/p CVA with R hemiparesis with a pmh of DM, constipation, iron def anemia and hem pos stool, htn, hyperlipidemia, bph, CAD, GERD who is being seen today for routine follow up on chronic conditions. pt still requires 1:1 sitter per facility protocol due to inappropriate behaviors- these have cont to be stable, otherwise doing well. pts blood sugars have been higher in the last month.   Review of Systems:  Review of Systems  Constitutional: Negative for fever, chills and malaise/fatigue.  Respiratory: Negative for cough and shortness of breath.   Cardiovascular: Negative for chest pain and leg swelling.  Gastrointestinal: Negative for heartburn, abdominal pain, diarrhea and constipation.  Genitourinary: Negative for dysuria, urgency and frequency.  Musculoskeletal: Negative for myalgias.  Skin: Negative.  Negative for rash.  Neurological: Negative for dizziness, focal weakness, weakness and headaches.  Psychiatric/Behavioral: Positive for memory loss. Negative for depression, suicidal ideas and hallucinations. The patient is not nervous/anxious and does not have insomnia.      Past Medical History  Diagnosis Date  . Hemiplegia following CVA (cerebrovascular accident)     R side weakness  . Hypertension   . Hyperlipidemia   . Diabetes mellitus without complication   . GERD (gastroesophageal reflux disease)   . Urinary incontinence   . Iron deficiency anemia   . Glaucoma    Past Surgical History  Procedure Laterality Date  . Circumcision    . Cataract extraction Left   . Tonsillectomy    . Esophagogastroduodenoscopy N/A 08/23/2013    Procedure:  ESOPHAGOGASTRODUODENOSCOPY (EGD);  Surgeon: Gatha Mayer, MD;  Location: [facility];  Service: Endoscopy;  Laterality: N/A;  . Colonoscopy N/A 08/23/2013    Procedure: COLONOSCOPY;  Surgeon: Gatha Mayer, MD;  Location: [facility];  Service: Endoscopy;  Laterality: N/A;   Social History:   reports that he has quit smoking. He has never used smokeless tobacco. He reports that he does not drink alcohol or use illicit drugs.  Family History  Problem Relation Age of Onset  . Colon cancer Neg Hx   . Throat cancer Neg Hx   . Diabetes Maternal Grandfather   . Heart disease Neg Hx   . Kidney disease Father   . Liver disease Neg Hx     Medications: Patient's Medications  New Prescriptions   No medications on file  Previous Medications   ACETAMINOPHEN (TYLENOL) 325 MG TABLET    Take 650 mg by mouth 2 (two) times daily as needed for mild pain or headache.    AMLODIPINE (NORVASC) 2.5 MG TABLET    Take 2.5 mg by mouth daily. Hold for BP<100/60 and HR <60   ASPIRIN 81 MG TABLET    Take 81 mg by mouth daily.   CETIRIZINE (ZYRTEC) 10 MG TABLET    Take 10 mg by mouth daily.   DIVALPROEX (DEPAKOTE) 250 MG DR TABLET    Take 375 mg by mouth 2 (two) times daily.    DORZOLAMIDE (TRUSOPT) 2 % OPHTHALMIC SOLUTION    Place 1 drop into both eyes 2 (two) times daily.   ESOMEPRAZOLE (NEXIUM) 40 MG CAPSULE  Take 40 mg by mouth daily at 12 noon.   FERROUS SULFATE 325 (65 FE) MG TABLET    Take 325 mg by mouth 2 (two) times daily with a meal.   FLUTICASONE (FLONASE) 50 MCG/ACT NASAL SPRAY    Place 1 spray into the nose 2 (two) times daily.    FUROSEMIDE (LASIX) 40 MG TABLET    Take 40 mg by mouth daily.   INSULIN DETEMIR (LEVEMIR) 100 UNIT/ML INJECTION    Inject 50 Units into the skin at bedtime.   INSULIN LISPRO (HUMALOG) 100 UNIT/ML INJECTION    Inject 4-10 Units into the skin See admin instructions. Inject 4 units in the morning, 10 units at lunch, and 10 units at dinner. May inject an additional 5  units if blood sugar reading is over 150.   LISINOPRIL (PRINIVIL,ZESTRIL) 5 MG TABLET    Take 5 mg by mouth daily.   LORAZEPAM (ATIVAN) 0.5 MG TABLET    Take 0.5 mg by mouth every 8 (eight) hours as needed for anxiety.   LUBIPROSTONE (AMITIZA) 24 MCG CAPSULE    Take 24 mcg by mouth 2 (two) times daily with a meal.   POLYETHYLENE GLYCOL (MIRALAX / GLYCOLAX) PACKET    Take 17 g by mouth daily.   PRAVASTATIN (PRAVACHOL) 20 MG TABLET    Take 20 mg by mouth daily.   RISPERIDONE (RISPERDAL) 0.5 MG TABLET    Take 0.5 mg by mouth 2 (two) times daily.   SERTRALINE (ZOLOFT) 100 MG TABLET    Take 100 mg by mouth daily.   TAMSULOSIN (FLOMAX) 0.4 MG CAPS CAPSULE    Take 0.4 mg by mouth daily.   TRAVOPROST, BENZALKONIUM, (TRAVATAN) 0.004 % OPHTHALMIC SOLUTION    Place 1 drop into both eyes at bedtime.   TRAZODONE (DESYREL) 50 MG TABLET    Take 50 mg by mouth at bedtime.  Modified Medications   No medications on file  Discontinued Medications   No medications on file     Physical Exam:  Filed Vitals:   12/09/13 1118  BP: 126/70  Pulse: 77  Temp: 98 F (36.7 C)  Resp: 20   Physical Exam  Constitutional: He is well-developed, well-nourished, and in no distress.  HENT:  Head: Normocephalic and atraumatic.  Mouth/Throat: Oropharynx is clear and moist. No oropharyngeal exudate.  Eyes: Conjunctivae and EOM are normal. Pupils are equal, round, and reactive to light.  Neck: Normal range of motion. Neck supple. No thyromegaly present.  Cardiovascular: Normal rate, regular rhythm and normal heart sounds.   Pulmonary/Chest: Effort normal and breath sounds normal.  Abdominal: Soft. Bowel sounds are normal. He exhibits no distension.  Musculoskeletal: He exhibits edema (lower ext edema). He exhibits no tenderness.  Self propels in [facility]  Neurological: He is alert.  Right sided weakness  Skin: Skin is warm and dry.  Psychiatric: Affect normal.      Labs reviewed: Basic Metabolic Panel:  Recent  Labs  10/04/13 2129  NA 133*  K 4.9  CL 98  CO2 22  GLUCOSE 124*  BUN 24*  CREATININE 1.41*  CALCIUM 8.6   Liver Function Tests:  Recent Labs  10/04/13 2129  AST 16  ALT 15  ALKPHOS 69  BILITOT 0.2*  PROT 7.6  ALBUMIN 3.3*   No results found for this basename: LIPASE, AMYLASE,  in the last 8760 hours No results found for this basename: AMMONIA,  in the last 8760 hours CBC:  Recent Labs  08/07/13 1118 10/04/13 2129  WBC 9.2 8.3  NEUTROABS 6.4 5.2  HGB 13.9 14.0  HCT 44.1 42.1  MCV 76.3* 78.5  PLT 237.0 187   CBC NO Diff (Complete Blood Count)  Result: 09/18/2013 10:51 AM ( Status: F ) C  WBC 5.8 4.0-10.5 K/uL SLN  RBC 5.48 4.22-5.81 MIL/uL SLN  Hemoglobin 14.0 13.0-17.0 g/dL SLN  Hematocrit 42.2 39.0-52.0 % SLN  MCV 77.0 L 78.0-100.0 fL SLN  MCH 25.5 L 26.0-34.0 pg SLN  MCHC 33.2 30.0-36.0 g/dL SLN  RDW 16.5 H 11.5-15.5 % SLN  Platelet Count 184 150-400 K/uL SLN  Comprehensive Metabolic Panel  Result: 04/03/2682 2:24 PM ( Status: F )  Sodium 137 135-145 mEq/L SLN  Potassium 4.0 3.5-5.3 mEq/L SLN  Chloride 101 96-112 mEq/L SLN  CO2 24 19-32 mEq/L SLN  Glucose 98 70-99 mg/dL SLN  BUN 21 6-23 mg/dL SLN  Creatinine 1.16 0.50-1.35 mg/dL SLN  Bilirubin, Total 0.3 0.2-1.2 mg/dL SLN  Alkaline Phosphatase 61 39-117 U/L SLN  AST/SGOT 12 0-37 U/L SLN  ALT/SGPT 10 0-53 U/L SLN  Total Protein 7.0 6.0-8.3 g/dL SLN  Albumin 3.5 3.5-5.2 g/dL SLN  Calcium 8.2 L 8.4-10.5 mg/dL SLN  Lipid Profile  Result: 09/18/2013 2:24 PM ( Status: F )  Cholesterol 130 0-200 mg/dL SLN C  Triglyceride 126 <150 mg/dL SLN  HDL Cholesterol 31 L >39 mg/dL SLN  Total Chol/HDL Ratio 4.2 Ratio SLN  VLDL Cholesterol (Calc) 25 0-40 mg/dL SLN  LDL Cholesterol (Calc) 74 0-99 mg/dL SLN C  TSH, Ultrasensitive  Result: 09/18/2013 12:39 PM ( Status: F )  TSH 2.032 0.350-4.500 uIU/mL SLN  Hemoglobin A1C  Result: 09/18/2013 2:19 PM ( Status: F )  Hemoglobin A1C 7.5 H <5.7 % SLN C  Estimated  Average Glucose 169 H <117 mg/dL SLN   Assessment/Plan 1. Type II or unspecified type diabetes mellitus without mention of complication, not stated as uncontrolled -blood sugars trending up on review, fasting elevated between 150-200s -will follow up A1c -getting 50 units levemir qhs and humalog with meals with additional coverage for blood sugars over 150 -will increase levemir to 55 units at this time  2. GERD -without complaints on omeprazole  3. Hypertension -stable on current medications, will follow up bmp  4. BPH (benign prostatic hyperplasia) -conts on flomax  5. Iron deficiency anemia -will follow up cbc, conts on iron  6. Late effects of CVA (cerebrovascular accident) - stable, without worsening of symptoms, conts on ASA  7. Psychosis -following with psych, conts on reisperdal   8. Depressed mood -stable on Depakote and zoloft   9. constipation -stable on amitiza   10. Allergic rhinitis -conts on zyrtec, still will have occasional cough

## 2014-01-13 ENCOUNTER — Non-Acute Institutional Stay (SKILLED_NURSING_FACILITY): Payer: Medicare Other | Admitting: Nurse Practitioner

## 2014-01-13 DIAGNOSIS — K219 Gastro-esophageal reflux disease without esophagitis: Secondary | ICD-10-CM

## 2014-01-13 DIAGNOSIS — I6990 Unspecified sequelae of unspecified cerebrovascular disease: Secondary | ICD-10-CM

## 2014-01-13 DIAGNOSIS — D509 Iron deficiency anemia, unspecified: Secondary | ICD-10-CM

## 2014-01-13 DIAGNOSIS — E119 Type 2 diabetes mellitus without complications: Secondary | ICD-10-CM

## 2014-01-13 DIAGNOSIS — I10 Essential (primary) hypertension: Secondary | ICD-10-CM

## 2014-01-13 DIAGNOSIS — R609 Edema, unspecified: Secondary | ICD-10-CM

## 2014-01-13 NOTE — Progress Notes (Signed)
Patient ID: Derek Crawford, male   DOB: 1933/04/17, 80 y.o.   MRN: [id number]    Nursing Home Location:  [place] of Service: SNF (31)  PCP: Cyndee Brightly, MD  No Known Allergies  Chief Complaint  Patient presents with  . Medical Management of Chronic Issues    HPI:  80 y.o. male who is s/p CVA with R hemiparesis with a pmh of DM, constipation, iron def anemia and hem pos stool, htn, hyperlipidemia, bph, CAD, GERD, who is being seen today for routine follow up on chronic conditions.  Pt conts on 1:1 sitter per facility protocol due to inappropriate behaviors- conts to make inappropriate gestures to staff, otherwise doing well. Last month insulin was adjusted and blood sugars have improved -conts to follow up with psych   Review of Systems:  Review of Systems  Constitutional: Negative for fever, chills and malaise/fatigue.  Respiratory: Positive for cough. Negative for sputum production, shortness of breath and wheezing.   Cardiovascular: Negative for chest pain and leg swelling.  Gastrointestinal: Negative for heartburn, abdominal pain, diarrhea and constipation.  Genitourinary: Negative for dysuria, urgency and frequency.  Musculoskeletal: Negative for myalgias.  Skin: Negative.  Negative for rash.  Neurological: Negative for dizziness, focal weakness, weakness and headaches.  Psychiatric/Behavioral: Positive for memory loss. Negative for depression, suicidal ideas and hallucinations. The patient is not nervous/anxious and does not have insomnia.      Past Medical History  Diagnosis Date  . Hemiplegia following CVA (cerebrovascular accident)     R side weakness  . Hypertension   . Hyperlipidemia   . Diabetes mellitus without complication   . GERD (gastroesophageal reflux disease)   . Urinary incontinence   . Iron deficiency anemia   . Glaucoma    Past Surgical History  Procedure Laterality Date  . Circumcision    . Cataract  extraction Left   . Tonsillectomy    . Esophagogastroduodenoscopy N/A 08/23/2013    Procedure: ESOPHAGOGASTRODUODENOSCOPY (EGD);  Surgeon: Gatha Mayer, MD;  Location: [facility];  Service: Endoscopy;  Laterality: N/A;  . Colonoscopy N/A 08/23/2013    Procedure: COLONOSCOPY;  Surgeon: Gatha Mayer, MD;  Location: [facility];  Service: Endoscopy;  Laterality: N/A;   Social History:   reports that he has quit smoking. He has never used smokeless tobacco. He reports that he does not drink alcohol or use illicit drugs.  Family History  Problem Relation Age of Onset  . Colon cancer Neg Hx   . Throat cancer Neg Hx   . Diabetes Maternal Grandfather   . Heart disease Neg Hx   . Kidney disease Father   . Liver disease Neg Hx     Medications: Patient's Medications  New Prescriptions   No medications on file  Previous Medications   ACETAMINOPHEN (TYLENOL) 325 MG TABLET    Take 650 mg by mouth 2 (two) times daily as needed for mild pain or headache.    AMLODIPINE (NORVASC) 2.5 MG TABLET    Take 2.5 mg by mouth daily. Hold for BP<100/60 and HR <60   ASPIRIN 81 MG TABLET    Take 81 mg by mouth daily.   CETIRIZINE (ZYRTEC) 10 MG TABLET    Take 10 mg by mouth daily.   DIVALPROEX (DEPAKOTE) 250 MG DR TABLET    Take 375 mg by mouth 2 (two) times daily.    DORZOLAMIDE (TRUSOPT) 2 % OPHTHALMIC SOLUTION    Place 1 drop into both eyes  2 (two) times daily.   ESOMEPRAZOLE (NEXIUM) 40 MG CAPSULE    Take 40 mg by mouth daily at 12 noon.   FERROUS SULFATE 325 (65 FE) MG TABLET    Take 325 mg by mouth 2 (two) times daily with a meal.   FLUTICASONE (FLONASE) 50 MCG/ACT NASAL SPRAY    Place 1 spray into the nose 2 (two) times daily.    FUROSEMIDE (LASIX) 40 MG TABLET    Take 40 mg by mouth daily.   INSULIN DETEMIR (LEVEMIR) 100 UNIT/ML INJECTION    Inject 60 Units into the skin at bedtime.    INSULIN LISPRO (HUMALOG) 100 UNIT/ML INJECTION    Inject 4-10 Units into the skin See admin instructions. Inject  4 units in the morning, 10 units at lunch, and 10 units at dinner. May inject an additional 5 units if blood sugar reading is over 150.   LISINOPRIL (PRINIVIL,ZESTRIL) 5 MG TABLET    Take 5 mg by mouth daily.   LORAZEPAM (ATIVAN) 0.5 MG TABLET    Take 0.5 mg by mouth every 8 (eight) hours as needed for anxiety.   LUBIPROSTONE (AMITIZA) 24 MCG CAPSULE    Take 24 mcg by mouth 2 (two) times daily with a meal.   POLYETHYLENE GLYCOL (MIRALAX / GLYCOLAX) PACKET    Take 17 g by mouth daily.   PRAVASTATIN (PRAVACHOL) 20 MG TABLET    Take 20 mg by mouth daily.   RISPERIDONE (RISPERDAL) 0.5 MG TABLET    Take 0.5 mg by mouth 2 (two) times daily.   SERTRALINE (ZOLOFT) 100 MG TABLET    Take 100 mg by mouth daily.   TAMSULOSIN (FLOMAX) 0.4 MG CAPS CAPSULE    Take 0.4 mg by mouth daily.   TRAVOPROST, BENZALKONIUM, (TRAVATAN) 0.004 % OPHTHALMIC SOLUTION    Place 1 drop into both eyes at bedtime.  Modified Medications   No medications on file  Discontinued Medications   TRAZODONE (DESYREL) 50 MG TABLET    Take 50 mg by mouth at bedtime.     Physical Exam: Physical Exam  Constitutional: He is well-developed, well-nourished, and in no distress.  HENT:  Head: Normocephalic and atraumatic.  Mouth/Throat: Oropharynx is clear and moist. No oropharyngeal exudate.  Eyes: Conjunctivae and EOM are normal. Pupils are equal, round, and reactive to light.  Neck: Normal range of motion. Neck supple. No thyromegaly present.  Cardiovascular: Normal rate, regular rhythm and normal heart sounds.   Pulmonary/Chest: Effort normal and breath sounds normal. No respiratory distress. He has no wheezes.  Abdominal: Soft. Bowel sounds are normal. He exhibits no distension.  Musculoskeletal: He exhibits edema (lower ext edema +1). He exhibits no tenderness.  Self propels in [facility]  Neurological: He is alert.  Right sided weakness  Skin: Skin is warm and dry.  Psychiatric: Affect normal.    Filed Vitals:   01/13/14 1518  BP:  130/67  Pulse: 70  Temp: 98.1 F (36.7 C)  Resp: 18      Labs reviewed: Basic Metabolic Panel:  Recent Labs  10/04/13 2129  NA 133*  K 4.9  CL 98  CO2 22  GLUCOSE 124*  BUN 24*  CREATININE 1.41*  CALCIUM 8.6   Liver Function Tests:  Recent Labs  10/04/13 2129  AST 16  ALT 15  ALKPHOS 69  BILITOT 0.2*  PROT 7.6  ALBUMIN 3.3*   No results found for this basename: LIPASE, AMYLASE,  in the last 8760 hours No results found for this  basename: AMMONIA,  in the last 8760 hours CBC:  Recent Labs  08/07/13 1118 10/04/13 2129  WBC 9.2 8.3  NEUTROABS 6.4 5.2  HGB 13.9 14.0  HCT 44.1 42.1  MCV 76.3* 78.5  PLT 237.0 187  Result: 09/18/2013 10:51 AM ( Status: F ) C  WBC 5.8 4.0-10.5 K/uL SLN  RBC 5.48 4.22-5.81 MIL/uL SLN  Hemoglobin 14.0 13.0-17.0 g/dL SLN  Hematocrit 42.2 39.0-52.0 % SLN  MCV 77.0 L 78.0-100.0 fL SLN  MCH 25.5 L 26.0-34.0 pg SLN  MCHC 33.2 30.0-36.0 g/dL SLN  RDW 16.5 H 11.5-15.5 % SLN  Platelet Count 184 150-400 K/uL SLN  Comprehensive Metabolic Panel  Result: 7/56/4332 2:24 PM ( Status: F )  Sodium 137 135-145 mEq/L SLN  Potassium 4.0 3.5-5.3 mEq/L SLN  Chloride 101 96-112 mEq/L SLN  CO2 24 19-32 mEq/L SLN  Glucose 98 70-99 mg/dL SLN  BUN 21 6-23 mg/dL SLN  Creatinine 1.16 0.50-1.35 mg/dL SLN  Bilirubin, Total 0.3 0.2-1.2 mg/dL SLN  Alkaline Phosphatase 61 39-117 U/L SLN  AST/SGOT 12 0-37 U/L SLN  ALT/SGPT 10 0-53 U/L SLN  Total Protein 7.0 6.0-8.3 g/dL SLN  Albumin 3.5 3.5-5.2 g/dL SLN  Calcium 8.2 L 8.4-10.5 mg/dL SLN  Lipid Profile  Result: 09/18/2013 2:24 PM ( Status: F )  Cholesterol 130 0-200 mg/dL SLN C  Triglyceride 126 <150 mg/dL SLN  HDL Cholesterol 31 L >39 mg/dL SLN  Total Chol/HDL Ratio 4.2 Ratio SLN  VLDL Cholesterol (Calc) 25 0-40 mg/dL SLN  LDL Cholesterol (Calc) 74 0-99 mg/dL SLN C  TSH, Ultrasensitive  Result: 09/18/2013 12:39 PM ( Status: F )  TSH 2.032 0.350-4.500 uIU/mL SLN  Hemoglobin A1C  Result:  09/18/2013 2:19 PM ( Status: F )  Hemoglobin A1C 7.5 H <5.7 % SLN C  Estimated Average Glucose 169 H <117 mg/dL SLN    CBC NO Diff (Complete Blood Count)    Result: 12/11/2013 9:53 AM   ( Status: F )     C WBC 6.4     4.0-10.5 K/uL SLN   RBC 4.97     4.22-5.81 MIL/uL SLN   Hemoglobin 12.8   L 13.0-17.0 g/dL SLN   Hematocrit 37.6   L 39.0-52.0 % SLN   MCV 75.7   L 78.0-100.0 fL SLN   MCH 25.8   L 26.0-34.0 pg SLN   MCHC 34.0     30.0-36.0 g/dL SLN   RDW 15.0     11.5-15.5 % SLN   Platelet Count 198     150-400 K/uL SLN   Basic Metabolic Panel    Result: 12/11/2013 10:54 AM   ( Status: F )       Sodium 136     135-145 mEq/L SLN   Potassium 5.2     3.5-5.3 mEq/L SLN   Chloride 101     96-112 mEq/L SLN   CO2 23     19-32 mEq/L SLN   Glucose 94     70-99 mg/dL SLN   BUN 30   H 6-23 mg/dL SLN   Creatinine 1.45   H 0.50-1.35 mg/dL SLN   Calcium 9.2     8.4-10.5 mg/dL SLN   Hemoglobin A1C    Result: 12/11/2013 2:27 PM   ( Status: F )       Hemoglobin A1C 8.6   H <5.7 % SLN C Estimated Average Glucose 200   H <117 mg/dL SLN    Assessment/Plan 1. Essential hypertension Have been stable, will decrease  lasix at this time and cont to monitor   2. Gastroesophageal reflux disease without esophagitis -occasional GERD now with cough, which could be related will change to protonix 40 mg daily to see if there is any improvement   3. Type II or unspecified type diabetes mellitus without mention of complication, not stated as uncontrolled -recent increase in lantus, A1c was worse on labs -blood sugars have  Improved since pt has been on lantus 60 units no hypoglycemic episodes noted   4. Late effects of CVA (cerebrovascular accident) -stable, conts on asa  5. Edema -has been stable ted hose ordered -will decrease lantis to 20 mg q day due to worsening renal function and follow up bmp in 2 weeks  6. Anemia -hgb remains stable

## 2014-02-24 ENCOUNTER — Non-Acute Institutional Stay (SKILLED_NURSING_FACILITY): Payer: Medicare Other | Admitting: Nurse Practitioner

## 2014-02-24 DIAGNOSIS — E119 Type 2 diabetes mellitus without complications: Secondary | ICD-10-CM

## 2014-02-24 DIAGNOSIS — I69959 Hemiplegia and hemiparesis following unspecified cerebrovascular disease affecting unspecified side: Secondary | ICD-10-CM

## 2014-02-24 DIAGNOSIS — K219 Gastro-esophageal reflux disease without esophagitis: Secondary | ICD-10-CM

## 2014-02-24 DIAGNOSIS — K5900 Constipation, unspecified: Secondary | ICD-10-CM

## 2014-02-24 DIAGNOSIS — I69359 Hemiplegia and hemiparesis following cerebral infarction affecting unspecified side: Secondary | ICD-10-CM

## 2014-02-24 DIAGNOSIS — N400 Enlarged prostate without lower urinary tract symptoms: Secondary | ICD-10-CM

## 2014-02-24 DIAGNOSIS — F3289 Other specified depressive episodes: Secondary | ICD-10-CM

## 2014-02-24 DIAGNOSIS — R4589 Other symptoms and signs involving emotional state: Secondary | ICD-10-CM

## 2014-02-24 DIAGNOSIS — F329 Major depressive disorder, single episode, unspecified: Secondary | ICD-10-CM

## 2014-02-24 DIAGNOSIS — D509 Iron deficiency anemia, unspecified: Secondary | ICD-10-CM

## 2014-02-24 NOTE — Progress Notes (Signed)
Patient ID: Derek Crawford, male   DOB: 1933/05/20, 80 y.o.   MRN: [id number]    Nursing Home Location:  [place] of Service: SNF (31)  PCP: Cyndee Brightly, MD  No Known Allergies  Chief Complaint  Patient presents with  . Medical Management of Chronic Issues    HPI:  80 y.o. male who is s/p CVA with R hemiparesis with a pmh of DM, constipation, iron def anemia and hem pos stool, htn, hyperlipidemia, bph, CAD, GERD, who is being seen today for routine follow up on chronic conditions. There has been no major changes in the last month. Lasix was reduced and Cr has improved. No significant changes in swelling but does have edema, conts to wear TED hose. Pt does not notice if swelling is worse. Denies pain. Reports ongoing GERD and cough. Does have hx of COPD and smoking.   Review of Systems:  Review of Systems  Constitutional: Negative for fever, chills and malaise/fatigue.  Respiratory: Positive for cough. Negative for sputum production, shortness of breath and wheezing.   Cardiovascular: Positive for leg swelling. Negative for chest pain.  Gastrointestinal: Positive for heartburn (occasional). Negative for abdominal pain, diarrhea and constipation.  Genitourinary: Negative for dysuria, urgency and frequency.  Musculoskeletal: Negative for myalgias.  Skin: Negative.  Negative for rash.  Neurological: Negative for dizziness, focal weakness, weakness and headaches.  Psychiatric/Behavioral: Positive for memory loss. Negative for depression, suicidal ideas and hallucinations. The patient is not nervous/anxious and does not have insomnia.      Past Medical History  Diagnosis Date  . Hemiplegia following CVA (cerebrovascular accident)     R side weakness  . Hypertension   . Hyperlipidemia   . Diabetes mellitus without complication   . GERD (gastroesophageal reflux disease)   . Urinary incontinence   . Iron deficiency anemia   . Glaucoma    Past  Surgical History  Procedure Laterality Date  . Circumcision    . Cataract extraction Left   . Tonsillectomy    . Esophagogastroduodenoscopy N/A 08/23/2013    Procedure: ESOPHAGOGASTRODUODENOSCOPY (EGD);  Surgeon: Gatha Mayer, MD;  Location: [facility];  Service: Endoscopy;  Laterality: N/A;  . Colonoscopy N/A 08/23/2013    Procedure: COLONOSCOPY;  Surgeon: Gatha Mayer, MD;  Location: [facility];  Service: Endoscopy;  Laterality: N/A;   Social History:   reports that he has quit smoking. He has never used smokeless tobacco. He reports that he does not drink alcohol or use illicit drugs.  Family History  Problem Relation Age of Onset  . Colon cancer Neg Hx   . Throat cancer Neg Hx   . Diabetes Maternal Grandfather   . Heart disease Neg Hx   . Kidney disease Father   . Liver disease Neg Hx     Medications: Patient's Medications  New Prescriptions   No medications on file  Previous Medications   ACETAMINOPHEN (TYLENOL) 325 MG TABLET    Take 650 mg by mouth 2 (two) times daily as needed for mild pain or headache.    AMLODIPINE (NORVASC) 2.5 MG TABLET    Take 2.5 mg by mouth daily. Hold for BP<100/60 and HR <60   ASPIRIN 81 MG TABLET    Take 81 mg by mouth daily.   CETIRIZINE (ZYRTEC) 10 MG TABLET    Take 10 mg by mouth daily.   DIVALPROEX (DEPAKOTE) 250 MG DR TABLET    Take 375 mg by mouth 2 (two) times daily.  DORZOLAMIDE (TRUSOPT) 2 % OPHTHALMIC SOLUTION    Place 1 drop into both eyes 2 (two) times daily.   FERROUS SULFATE 325 (65 FE) MG TABLET    Take 325 mg by mouth 2 (two) times daily with a meal.   FLUTICASONE (FLONASE) 50 MCG/ACT NASAL SPRAY    Place 1 spray into the nose 2 (two) times daily.    FUROSEMIDE (LASIX) 40 MG TABLET    Take 20 mg by mouth daily.    INSULIN DETEMIR (LEVEMIR) 100 UNIT/ML INJECTION    Inject 60 Units into the skin at bedtime.    INSULIN LISPRO (HUMALOG) 100 UNIT/ML INJECTION    Inject 4-10 Units into the skin See admin instructions. Inject 4  units in the morning, 10 units at lunch, and 10 units at dinner. May inject an additional 5 units if blood sugar reading is over 150.   LISINOPRIL (PRINIVIL,ZESTRIL) 5 MG TABLET    Take 5 mg by mouth daily.   LORAZEPAM (ATIVAN) 0.5 MG TABLET    Take 0.5 mg by mouth every 8 (eight) hours as needed for anxiety.   LUBIPROSTONE (AMITIZA) 24 MCG CAPSULE    Take 24 mcg by mouth 2 (two) times daily with a meal.   PANTOPRAZOLE (PROTONIX) 40 MG TABLET    Take 40 mg by mouth daily.   POLYETHYLENE GLYCOL (MIRALAX / GLYCOLAX) PACKET    Take 17 g by mouth daily.   PRAVASTATIN (PRAVACHOL) 20 MG TABLET    Take 20 mg by mouth daily.   RISPERIDONE (RISPERDAL) 0.5 MG TABLET    Take 0.5 mg by mouth 2 (two) times daily.   SERTRALINE (ZOLOFT) 100 MG TABLET    Take 100 mg by mouth daily.   TAMSULOSIN (FLOMAX) 0.4 MG CAPS CAPSULE    Take 0.4 mg by mouth daily.   TRAVOPROST, BENZALKONIUM, (TRAVATAN) 0.004 % OPHTHALMIC SOLUTION    Place 1 drop into both eyes at bedtime.  Modified Medications   No medications on file  Discontinued Medications   ESOMEPRAZOLE (NEXIUM) 40 MG CAPSULE    Take 40 mg by mouth daily at 12 noon.     Physical Exam: Physical Exam  Constitutional: He is well-developed, well-nourished, and in no distress.  HENT:  Head: Normocephalic and atraumatic.  Mouth/Throat: Oropharynx is clear and moist. No oropharyngeal exudate.  Eyes: Conjunctivae and EOM are normal. Pupils are equal, round, and reactive to light.  Neck: Normal range of motion. Neck supple. No thyromegaly present.  Cardiovascular: Normal rate, regular rhythm and normal heart sounds.   Pulmonary/Chest: Effort normal. He has decreased breath sounds.  Abdominal: Soft. Bowel sounds are normal. He exhibits no distension.  Musculoskeletal: He exhibits edema (lower ext edema +1). He exhibits no tenderness.  Self propels in [facility]  Neurological: He is alert.  Right sided weakness  Skin: Skin is warm and dry.  Psychiatric: Affect normal.     Filed Vitals:   02/24/14 1249  BP: 120/60  Pulse: 77  Temp: 98.1 F (36.7 C)  Resp: 20      Labs reviewed: Basic Metabolic Panel:  Recent Labs  10/04/13 2129  NA 133*  K 4.9  CL 98  CO2 22  GLUCOSE 124*  BUN 24*  CREATININE 1.41*  CALCIUM 8.6   Liver Function Tests:  Recent Labs  10/04/13 2129  AST 16  ALT 15  ALKPHOS 69  BILITOT 0.2*  PROT 7.6  ALBUMIN 3.3*   No results found for this basename: LIPASE, AMYLASE,  in  the last 8760 hours No results found for this basename: AMMONIA,  in the last 8760 hours CBC:  Recent Labs  08/07/13 1118 10/04/13 2129  WBC 9.2 8.3  NEUTROABS 6.4 5.2  HGB 13.9 14.0  HCT 44.1 42.1  MCV 76.3* 78.5  PLT 237.0 187  Result: 09/18/2013 10:51 AM ( Status: F ) C  WBC 5.8 4.0-10.5 K/uL SLN  RBC 5.48 4.22-5.81 MIL/uL SLN  Hemoglobin 14.0 13.0-17.0 g/dL SLN  Hematocrit 42.2 39.0-52.0 % SLN  MCV 77.0 L 78.0-100.0 fL SLN  MCH 25.5 L 26.0-34.0 pg SLN  MCHC 33.2 30.0-36.0 g/dL SLN  RDW 16.5 H 11.5-15.5 % SLN  Platelet Count 184 150-400 K/uL SLN  Comprehensive Metabolic Panel  Result: 5/36/6440 2:24 PM ( Status: F )  Sodium 137 135-145 mEq/L SLN  Potassium 4.0 3.5-5.3 mEq/L SLN  Chloride 101 96-112 mEq/L SLN  CO2 24 19-32 mEq/L SLN  Glucose 98 70-99 mg/dL SLN  BUN 21 6-23 mg/dL SLN  Creatinine 1.16 0.50-1.35 mg/dL SLN  Bilirubin, Total 0.3 0.2-1.2 mg/dL SLN  Alkaline Phosphatase 61 39-117 U/L SLN  AST/SGOT 12 0-37 U/L SLN  ALT/SGPT 10 0-53 U/L SLN  Total Protein 7.0 6.0-8.3 g/dL SLN  Albumin 3.5 3.5-5.2 g/dL SLN  Calcium 8.2 L 8.4-10.5 mg/dL SLN  Lipid Profile  Result: 09/18/2013 2:24 PM ( Status: F )  Cholesterol 130 0-200 mg/dL SLN C  Triglyceride 126 <150 mg/dL SLN  HDL Cholesterol 31 L >39 mg/dL SLN  Total Chol/HDL Ratio 4.2 Ratio SLN  VLDL Cholesterol (Calc) 25 0-40 mg/dL SLN  LDL Cholesterol (Calc) 74 0-99 mg/dL SLN C  TSH, Ultrasensitive  Result: 09/18/2013 12:39 PM ( Status: F )  TSH 2.032 0.350-4.500  uIU/mL SLN  Hemoglobin A1C  Result: 09/18/2013 2:19 PM ( Status: F )  Hemoglobin A1C 7.5 H <5.7 % SLN C  Estimated Average Glucose 169 H <117 mg/dL SLN    CBC NO Diff (Complete Blood Count)    Result: 12/11/2013 9:53 AM   ( Status: F )     C WBC 6.4     4.0-10.5 K/uL SLN   RBC 4.97     4.22-5.81 MIL/uL SLN   Hemoglobin 12.8   L 13.0-17.0 g/dL SLN   Hematocrit 37.6   L 39.0-52.0 % SLN   MCV 75.7   L 78.0-100.0 fL SLN   MCH 25.8   L 26.0-34.0 pg SLN   MCHC 34.0     30.0-36.0 g/dL SLN   RDW 15.0     11.5-15.5 % SLN   Platelet Count 198     150-400 K/uL SLN   Basic Metabolic Panel    Result: 12/11/2013 10:54 AM   ( Status: F )       Sodium 136     135-145 mEq/L SLN   Potassium 5.2     3.5-5.3 mEq/L SLN   Chloride 101     96-112 mEq/L SLN   CO2 23     19-32 mEq/L SLN   Glucose 94     70-99 mg/dL SLN   BUN 30   H 6-23 mg/dL SLN   Creatinine 1.45   H 0.50-1.35 mg/dL SLN   Calcium 9.2     8.4-10.5 mg/dL SLN   Hemoglobin A1C    Result: 12/11/2013 2:27 PM   ( Status: F )       Hemoglobin A1C 8.6   H <5.7 % SLN C Estimated Average Glucose 200   H <117 mg/dL SLN Basic Metabolic Panel  Result: 01/27/2014 11:30 AM   ( Status: F )     C Sodium 139     135-145 mEq/L SLN   Potassium 4.4     3.5-5.3 mEq/L SLN   Chloride 105     96-112 mEq/L SLN   CO2 24     19-32 mEq/L SLN   Glucose 86     70-99 mg/dL SLN   BUN 20     6-23 mg/dL SLN   Creatinine 1.26     0.50-1.35 mg/dL SLN   Calcium 8.2   L 8.4-10.5 mg/dL SLN    Assessment/Plan  1. Gastroesophageal reflux disease without esophagitis - protonix added last month, will cont at this time and follow  2. Unspecified constipation -stable  3. Type II or unspecified type diabetes mellitus without mention of complication, not stated as uncontrolled Fasting blood sugar ranges from high 90s-200s. Will cont SSI and lantus  4. Hemiplegia following CVA (cerebrovascular accident) Unchanged, conts to work with staff  5. BPH (benign prostatic  hyperplasia) -stable   6. Iron deficiency anemia -hgb was stable but trending down from march, will follow up anemia studies with next blood work  7. Depressed mood -stable, conts to be followed by psych services.

## 2014-04-07 ENCOUNTER — Non-Acute Institutional Stay (SKILLED_NURSING_FACILITY): Payer: Medicare Other | Admitting: Nurse Practitioner

## 2014-04-07 DIAGNOSIS — N400 Enlarged prostate without lower urinary tract symptoms: Secondary | ICD-10-CM

## 2014-04-07 DIAGNOSIS — K219 Gastro-esophageal reflux disease without esophagitis: Secondary | ICD-10-CM

## 2014-04-07 DIAGNOSIS — E119 Type 2 diabetes mellitus without complications: Secondary | ICD-10-CM

## 2014-04-07 DIAGNOSIS — J309 Allergic rhinitis, unspecified: Secondary | ICD-10-CM

## 2014-04-07 DIAGNOSIS — K5900 Constipation, unspecified: Secondary | ICD-10-CM

## 2014-04-07 DIAGNOSIS — I10 Essential (primary) hypertension: Secondary | ICD-10-CM

## 2014-04-07 DIAGNOSIS — D509 Iron deficiency anemia, unspecified: Secondary | ICD-10-CM

## 2014-04-07 NOTE — Progress Notes (Signed)
Patient ID: Derek Crawford, male   DOB: Nov 10, 1932, 80 y.o.   MRN: [id number] Patient ID: Derek Crawford, male   DOB: [birth year], 80 y.o.   MRN: [id number]    Nursing Home Location:  [place] of Service: [facility] (31)  PCP: Cyndee Brightly, MD  No Known Allergies  Chief Complaint  Patient presents with  . Medical Management of Chronic Issues    HPI:  80 y.o. male who is s/p CVA with R hemiparesis with a pmh of DM, constipation, iron def anemia and hem pos stool, htn, hyperlipidemia, bph, CAD, GERD, who is being seen today for routine follow up on chronic conditions. There has been no major changes in the last month. conts to wear TED hose for swelling. Denies pain. Reports ongoing GERD and cough. Does have hx of COPD and smoking no worsening of cough or congestion. Reports loose stools, states he is not having diarrhea just frequent loose stools.    Review of Systems:  Review of Systems  Constitutional: Negative for fever, chills and malaise/fatigue.  Respiratory: Positive for cough. Negative for sputum production, shortness of breath and wheezing.   Cardiovascular: Negative for chest pain and palpitations.  Gastrointestinal: Positive for heartburn (occasional). Negative for abdominal pain, diarrhea and constipation.  Genitourinary: Negative for dysuria, urgency and frequency.  Musculoskeletal: Negative for myalgias.  Skin: Negative.  Negative for rash.  Neurological: Negative for dizziness, tremors, focal weakness, weakness and headaches.  Psychiatric/Behavioral: Positive for memory loss. Negative for depression, suicidal ideas and hallucinations. The patient is not nervous/anxious and does not have insomnia.      Past Medical History  Diagnosis Date  . Hemiplegia following CVA (cerebrovascular accident)     R side weakness  . Hypertension   . Hyperlipidemia   . Diabetes mellitus without complication   . GERD (gastroesophageal reflux disease)   .  Urinary incontinence   . Iron deficiency anemia   . Glaucoma    Past Surgical History  Procedure Laterality Date  . Circumcision    . Cataract extraction Left   . Tonsillectomy    . Esophagogastroduodenoscopy N/A 08/23/2013    Procedure: ESOPHAGOGASTRODUODENOSCOPY (EGD);  Surgeon: Gatha Mayer, MD;  Location: [facility];  Service: Endoscopy;  Laterality: N/A;  . Colonoscopy N/A 08/23/2013    Procedure: COLONOSCOPY;  Surgeon: Gatha Mayer, MD;  Location: [facility];  Service: Endoscopy;  Laterality: N/A;   Social History:   reports that he has quit smoking. He has never used smokeless tobacco. He reports that he does not drink alcohol or use illicit drugs.  Family History  Problem Relation Age of Onset  . Colon cancer Neg Hx   . Throat cancer Neg Hx   . Diabetes Maternal Grandfather   . Heart disease Neg Hx   . Kidney disease Father   . Liver disease Neg Hx     Medications: Patient's Medications  New Prescriptions   No medications on file  Previous Medications   ACETAMINOPHEN (TYLENOL) 325 MG TABLET    Take 650 mg by mouth 2 (two) times daily as needed for mild pain or headache.    AMLODIPINE (NORVASC) 2.5 MG TABLET    Take 2.5 mg by mouth daily. Hold for BP<100/60 and HR <60   ASPIRIN 81 MG TABLET    Take 81 mg by mouth daily.   CETIRIZINE (ZYRTEC) 10 MG TABLET    Take 10 mg by mouth daily.   DIVALPROEX (DEPAKOTE) 250 MG DR TABLET  Take 375 mg by mouth 2 (two) times daily.    DORZOLAMIDE (TRUSOPT) 2 % OPHTHALMIC SOLUTION    Place 1 drop into both eyes 2 (two) times daily.   FERROUS SULFATE 325 (65 FE) MG TABLET    Take 325 mg by mouth 2 (two) times daily with a meal.   FLUTICASONE (FLONASE) 50 MCG/ACT NASAL SPRAY    Place 1 spray into the nose 2 (two) times daily.    FUROSEMIDE (LASIX) 40 MG TABLET    Take 20 mg by mouth daily.    INSULIN DETEMIR (LEVEMIR) 100 UNIT/ML INJECTION    Inject 60 Units into the skin at bedtime.    INSULIN LISPRO (HUMALOG) 100 UNIT/ML  INJECTION    Inject 4-10 Units into the skin See admin instructions. Inject 4 units in the morning, 10 units at lunch, and 10 units at dinner. May inject an additional 5 units if blood sugar reading is over 150.   LISINOPRIL (PRINIVIL,ZESTRIL) 5 MG TABLET    Take 5 mg by mouth daily.   LORAZEPAM (ATIVAN) 0.5 MG TABLET    Take 0.5 mg by mouth every 8 (eight) hours as needed for anxiety.   LUBIPROSTONE (AMITIZA) 24 MCG CAPSULE    Take 24 mcg by mouth 2 (two) times daily with a meal.   PANTOPRAZOLE (PROTONIX) 40 MG TABLET    Take 40 mg by mouth daily.   POLYETHYLENE GLYCOL (MIRALAX / GLYCOLAX) PACKET    Take 17 g by mouth daily.   PRAVASTATIN (PRAVACHOL) 20 MG TABLET    Take 20 mg by mouth daily.   RISPERIDONE (RISPERDAL) 0.5 MG TABLET    Take 0.5 mg by mouth 2 (two) times daily.   SERTRALINE (ZOLOFT) 100 MG TABLET    Take 100 mg by mouth daily.   TAMSULOSIN (FLOMAX) 0.4 MG CAPS CAPSULE    Take 0.4 mg by mouth daily.   TRAVOPROST, BENZALKONIUM, (TRAVATAN) 0.004 % OPHTHALMIC SOLUTION    Place 1 drop into both eyes at bedtime.  Modified Medications   No medications on file  Discontinued Medications   No medications on file     Physical Exam: Physical Exam  Constitutional: He is well-developed, well-nourished, and in no distress.  HENT:  Head: Normocephalic and atraumatic.  Mouth/Throat: Oropharynx is clear and moist. No oropharyngeal exudate.  Eyes: Conjunctivae and EOM are normal. Pupils are equal, round, and reactive to light.  Neck: Normal range of motion. Neck supple. No thyromegaly present.  Cardiovascular: Normal rate, regular rhythm and normal heart sounds.   Pulmonary/Chest: Effort normal. He has decreased breath sounds.  Abdominal: Soft. Bowel sounds are normal. He exhibits no distension.  Musculoskeletal: He exhibits edema (lower ext edema +1). He exhibits no tenderness.  Self propels in [facility]  Neurological: He is alert.  Right sided weakness  Skin: Skin is warm and dry.    Psychiatric: Affect normal.    Filed Vitals:   04/07/14 1420  BP: 144/66  Pulse: 70  Temp: 97.2 F (36.2 C)  Resp: 20  Weight: 225 lb (102.059 kg)      Labs reviewed: Basic Metabolic Panel:  Recent Labs  10/04/13 2129  NA 133*  K 4.9  CL 98  CO2 22  GLUCOSE 124*  BUN 24*  CREATININE 1.41*  CALCIUM 8.6   Liver Function Tests:  Recent Labs  10/04/13 2129  AST 16  ALT 15  ALKPHOS 69  BILITOT 0.2*  PROT 7.6  ALBUMIN 3.3*   No results found for  this basename: LIPASE, AMYLASE,  in the last 8760 hours No results found for this basename: AMMONIA,  in the last 8760 hours CBC:  Recent Labs  08/07/13 1118 10/04/13 2129  WBC 9.2 8.3  NEUTROABS 6.4 5.2  HGB 13.9 14.0  HCT 44.1 42.1  MCV 76.3* 78.5  PLT 237.0 187  Result: 09/18/2013 10:51 AM ( Status: F ) C  WBC 5.8 4.0-10.5 K/uL SLN  RBC 5.48 4.22-5.81 MIL/uL SLN  Hemoglobin 14.0 13.0-17.0 g/dL SLN  Hematocrit 42.2 39.0-52.0 % SLN  MCV 77.0 L 78.0-100.0 fL SLN  MCH 25.5 L 26.0-34.0 pg SLN  MCHC 33.2 30.0-36.0 g/dL SLN  RDW 16.5 H 11.5-15.5 % SLN  Platelet Count 184 150-400 K/uL SLN  Comprehensive Metabolic Panel  Result: 0/27/2536 2:24 PM ( Status: F )  Sodium 137 135-145 mEq/L SLN  Potassium 4.0 3.5-5.3 mEq/L SLN  Chloride 101 96-112 mEq/L SLN  CO2 24 19-32 mEq/L SLN  Glucose 98 70-99 mg/dL SLN  BUN 21 6-23 mg/dL SLN  Creatinine 1.16 0.50-1.35 mg/dL SLN  Bilirubin, Total 0.3 0.2-1.2 mg/dL SLN  Alkaline Phosphatase 61 39-117 U/L SLN  AST/SGOT 12 0-37 U/L SLN  ALT/SGPT 10 0-53 U/L SLN  Total Protein 7.0 6.0-8.3 g/dL SLN  Albumin 3.5 3.5-5.2 g/dL SLN  Calcium 8.2 L 8.4-10.5 mg/dL SLN  Lipid Profile  Result: 09/18/2013 2:24 PM ( Status: F )  Cholesterol 130 0-200 mg/dL SLN C  Triglyceride 126 <150 mg/dL SLN  HDL Cholesterol 31 L >39 mg/dL SLN  Total Chol/HDL Ratio 4.2 Ratio SLN  VLDL Cholesterol (Calc) 25 0-40 mg/dL SLN  LDL Cholesterol (Calc) 74 0-99 mg/dL SLN C  TSH, Ultrasensitive  Result:  09/18/2013 12:39 PM ( Status: F )  TSH 2.032 0.350-4.500 uIU/mL SLN  Hemoglobin A1C  Result: 09/18/2013 2:19 PM ( Status: F )  Hemoglobin A1C 7.5 H <5.7 % SLN C  Estimated Average Glucose 169 H <117 mg/dL SLN    CBC NO Diff (Complete Blood Count)    Result: 12/11/2013 9:53 AM   ( Status: F )     C WBC 6.4     4.0-10.5 K/uL SLN   RBC 4.97     4.22-5.81 MIL/uL SLN   Hemoglobin 12.8   L 13.0-17.0 g/dL SLN   Hematocrit 37.6   L 39.0-52.0 % SLN   MCV 75.7   L 78.0-100.0 fL SLN   MCH 25.8   L 26.0-34.0 pg SLN   MCHC 34.0     30.0-36.0 g/dL SLN   RDW 15.0     11.5-15.5 % SLN   Platelet Count 198     150-400 K/uL SLN   Basic Metabolic Panel    Result: 12/11/2013 10:54 AM   ( Status: F )       Sodium 136     135-145 mEq/L SLN   Potassium 5.2     3.5-5.3 mEq/L SLN   Chloride 101     96-112 mEq/L SLN   CO2 23     19-32 mEq/L SLN   Glucose 94     70-99 mg/dL SLN   BUN 30   H 6-23 mg/dL SLN   Creatinine 1.45   H 0.50-1.35 mg/dL SLN   Calcium 9.2     8.4-10.5 mg/dL SLN   Hemoglobin A1C    Result: 12/11/2013 2:27 PM   ( Status: F )       Hemoglobin A1C 8.6   H <5.7 % SLN C Estimated Average Glucose 200   H <  117 mg/dL SLN Basic Metabolic Panel    Result: 01/27/2014 11:30 AM   ( Status: F )     C Sodium 139     135-145 mEq/L SLN   Potassium 4.4     3.5-5.3 mEq/L SLN   Chloride 105     96-112 mEq/L SLN   CO2 24     19-32 mEq/L SLN   Glucose 86     70-99 mg/dL SLN   BUN 20     6-23 mg/dL SLN   Creatinine 1.26     0.50-1.35 mg/dL SLN   Calcium 8.2   L 8.4-10.5 mg/dL SLN   Iron and IBC    Result: 03/17/2014 12:53 PM   ( Status: F )     C Iron 39   L 42-165 ug/dL SLN   UIBC 280     125-400 ug/dL SLN   TIBC 319     215-435 ug/dL SLN   %SAT 12   L 20-55 % SLN   CBC NO Diff (Complete Blood Count)    Result: 03/17/2014 1:11 PM   ( Status: F )       WBC 6.7     4.0-10.5 K/uL SLN   RBC 4.40     4.22-5.81 MIL/uL SLN   Hemoglobin 11.7   L 13.0-17.0 g/dL SLN   Hematocrit 35.4   L 39.0-52.0 % SLN    MCV 80.5     78.0-100.0 fL SLN   MCH 26.6     26.0-34.0 pg SLN   MCHC 33.1     30.0-36.0 g/dL SLN   RDW 13.5     11.5-15.5 % SLN   Platelet Count 204     150-400 K/uL SLN   Comprehensive Metabolic Panel    Result: 03/17/2014 12:53 PM   ( Status: F )       Sodium 135     135-145 mEq/L SLN   Potassium 4.0     3.5-5.3 mEq/L SLN   Chloride 100     96-112 mEq/L SLN   CO2 27     19-32 mEq/L SLN   Glucose 183   H 70-99 mg/dL SLN   BUN 18     6-23 mg/dL SLN   Creatinine 1.26     0.50-1.35 mg/dL SLN   Bilirubin, Total 0.2     0.2-1.2 mg/dL SLN   Alkaline Phosphatase 59     39-117 U/L SLN   AST/SGOT 13     0-37 U/L SLN   ALT/SGPT 11     0-53 U/L SLN   Total Protein 6.8     6.0-8.3 g/dL SLN   Albumin 3.5     3.5-5.2 g/dL SLN   Calcium 8.4     8.4-10.5 mg/dL SLN   Ferritin    Result: 03/17/2014 12:26 PM   ( Status: F )       Ferritin 118     22-322 ng/mL SLN   Hemoglobin A1C    Result: 03/17/2014 3:26 PM   ( Status: F )       Hemoglobin A1C 7.8   H <5.7 % SLN C Estimated Average Glucose 177   H <117 mg/dL SLN  Assessment/Plan  1. Gastroesophageal reflux disease without esophagitis -improved  2. Essential hypertension -Patients hypertension is stable; continue current regimen. Will monitor and make changes as necessary.  3. Unspecified constipation -pt currently on amitiza and miralax, will dc miralax at this time.   4. Type II or unspecified type  diabetes mellitus without mention of complication, not stated as uncontrolled A1c has improved, fasting blood sugars ranging from 100-150s   5. BPH (benign prostatic hyperplasia) -conts on flomax  6. Iron def anemia -iron level remains low, hgb stable, will cont iron supplement  7. Allergic rhinitis -conts on zyrtec and flonase

## 2014-05-20 ENCOUNTER — Non-Acute Institutional Stay (SKILLED_NURSING_FACILITY): Payer: PRIVATE HEALTH INSURANCE | Admitting: Internal Medicine

## 2014-05-20 DIAGNOSIS — D509 Iron deficiency anemia, unspecified: Secondary | ICD-10-CM

## 2014-05-20 DIAGNOSIS — I69359 Hemiplegia and hemiparesis following cerebral infarction affecting unspecified side: Secondary | ICD-10-CM

## 2014-05-20 DIAGNOSIS — E1121 Type 2 diabetes mellitus with diabetic nephropathy: Secondary | ICD-10-CM

## 2014-05-22 NOTE — Progress Notes (Addendum)
Patient ID: Derek Crawford, male   DOB: 1932/08/20, 80 y.o.   MRN: [id number]              PROGRESS NOTE  DATE:  05/20/2014     FACILITY: [facility]    LEVEL OF CARE:   SNF   Routine Visit   CHIEF COMPLAINT:  Routine visit to follow medical problems, [employer] visit.    HISTORY OF PRESENT ILLNESS:  This is a patient who has been here since the early part of this year, in February.  He was transferred from [facility].  He had been there for 5-6 months.    He has a history of a dominant hemisphere stroke with right-sided weakness.     PAST MEDICAL HISTORY/PROBLEM LIST:    Hemiplegia following a CVA with right-sided weakness.    Hypertension.    Hyperlipidemia.    Type 2 diabetes.  On insulin.    Gastroesophageal reflux disease.    Urinary frequency and some incontinence.    History of iron deficiency anemia.  The history here was never really clear.    CURRENT MEDICATIONS:  Medication list is reviewed.     ASA 81 q.d.        Zyrtec 10 q.d.    Prinivil 5 mg daily.    Norvasc 2.5 daily.    Lasix 20 q.d.    Ferrous sulfate 325 b.i.d.    Trusopt ophthalmic bilaterally.    Flonase 50 mcg spray in each nostril twice a day.    Amitiza 24 mcg, 1 capsule twice daily.    Risperdal M-Tab 0.5 b.i.d.    Depakote 375 twice daily.    Lipitor 10 q.d.    Flomax 0.4 q.d.    Xalatan ophthalmic q.h.s., both eyes.    Humalog 4 U every morning and 10 U with lunch and dinner.    Levemir 40 U at bedtime.    REVIEW OF SYSTEMS:     CHEST/RESPIRATORY:  No complaints of shortness of breath.   CARDIAC:   No exertional chest pain.    GI:  He has chronic constipation.   GU:  States he has urinary frequency.    PHYSICAL EXAMINATION:   VITAL SIGNS:   TEMPERATURE:  97.7.   PULSE:  62.   RESPIRATIONS:  18.   BLOOD PRESSURE:  125/56.   WEIGHT:  226.   CHEST/RESPIRATORY:  Clear air entry bilaterally.    CARDIOVASCULAR:  CARDIAC:   Heart sounds are normal.  He  has no carotid bruits.   GASTROINTESTINAL:  ABDOMEN:   Distended.  However, there is no tenderness or masses.   GENITOURINARY:  BLADDER:   Not distended.    ASSESSMENT/PLAN:  Late-effect dominant hemisphere CVA.    Hypertension.  This is stable  History of depression with anxiety, although I am not quite sure why he is on antipsychotics.    Type 2 diabetes.  Recent hemoglobin A1c was 7.8.  Some degree of chronic renal insufficiency, creatinine at 1.26.    History of iron deficiency anemia.  The history here was never really clarified.  He is on iron.  His hemoglobin is stabilized.

## 2014-07-15 ENCOUNTER — Non-Acute Institutional Stay (SKILLED_NURSING_FACILITY): Payer: Medicare Other | Admitting: Internal Medicine

## 2014-07-15 DIAGNOSIS — I10 Essential (primary) hypertension: Secondary | ICD-10-CM

## 2014-07-15 DIAGNOSIS — I69359 Hemiplegia and hemiparesis following cerebral infarction affecting unspecified side: Secondary | ICD-10-CM

## 2014-07-15 DIAGNOSIS — E1121 Type 2 diabetes mellitus with diabetic nephropathy: Secondary | ICD-10-CM

## 2014-07-18 NOTE — Progress Notes (Addendum)
Patient ID: Derek Crawford, male   DOB: 1933-04-01, 81 y.o.   MRN: [id number]              PROGRESS NOTE  DATE:  07/15/2014              FACILITY: [facility]                  LEVEL OF CARE:   SNF   Routine Visit   CHIEF COMPLAINT:  Routine visit to follow medical issues/[employer] visit.    HISTORY OF PRESENT ILLNESS:  This gentleman was admitted in February 2015.  He had transferred from [facility] and he had been there for 5-6 months.    He has a history of a dominant hemisphere stroke with right-sided weakness in the setting of hypertension, diabetes with chronic renal failure.    He has done fairly well in the building.  He has had no recent problems.  His blood pressure is under control.  Last hemoglobin A1c was 7.7 in December.  He is followed by Psychiatry in-house.    PAST MEDICAL HISTORY/PROBLEM LIST:                        Hemiplegia following a CVA, with right-sided weakness.    Hypertension.    Hyperlipidemia.    Type 2 diabetes.  On insulin.    Gastroesophageal reflux disease.    Urinary frequency.    History of iron deficiency.    History of depression.    CURRENT MEDICATIONS:   Medication list is reviewed.      ASA 81 q.d.    Zyrtec 10 q.d.    Prinivil 5 q.d.    Norvasc 2.5 q.d.    MiraLAX 17 g q.d.    Zoloft 100 q.d.    Protonix 40 q.d.     Lasix 20 mg daily.    Ferrous sulfate 325 b.i.d.    Trusopt ophthalmic.    Flonase nasal.    Amitiza 24 b.i.d.    Risperdal M-tabs 0.5 q.a.m. and q.h.s.      Depakote 375 twice daily.    Lipitor 10 q.d.    Flomax 0.4 q.d.    Xalatan ophthalmic.    Humalog 4 U every morning, 10 U with lunch and dinner, and 5 U additionally if the blood sugar is greater than 150.    Levemir 60 U every night at bedtime.    REVIEW OF SYSTEMS:              CHEST/RESPIRATORY:  No shortness of breath.  CARDIAC:   No chest pain.    GI:  He has chronic constipation.   GU:  Urinary frequency.     HEENT:  I note that bilateral hearing aids have been recommended.    PHYSICAL EXAMINATION:   CHEST/RESPIRATORY:  Clear air entry bilaterally.   CARDIOVASCULAR:  CARDIAC:   Heart sounds are normal.  No bruits.   GASTROINTESTINAL:  ABDOMEN:   Soft, nontender.    ASSESSMENT/PLAN:               Late-effect dominant hemisphere CVA.  No neurologic changes.    Hypertension.    History of depression with anxiety.  He remains on Risperdal 0.5 b.i.d. and Depakote.  I am not exactly sure of the logic here for the risperidol   Type 2 diabetes.  Recent hemoglobin A1c was 7.7 on 06/30/2014.  He has had diabetic foot care and has  had cataract surgery.      CPT CODE: 98338

## 2014-09-09 ENCOUNTER — Non-Acute Institutional Stay (SKILLED_NURSING_FACILITY): Payer: Medicare Other | Admitting: Internal Medicine

## 2014-09-09 DIAGNOSIS — E1121 Type 2 diabetes mellitus with diabetic nephropathy: Secondary | ICD-10-CM | POA: Diagnosis not present

## 2014-09-09 DIAGNOSIS — I69359 Hemiplegia and hemiparesis following cerebral infarction affecting unspecified side: Secondary | ICD-10-CM | POA: Diagnosis not present

## 2014-09-13 NOTE — Progress Notes (Addendum)
Patient ID: Derek Crawford, male   DOB: 01-31-1933, 81 y.o.   MRN: [id number]               PROGRESS NOTE  DATE:  09/09/2014                FACILITY: [facility]        LEVEL OF CARE:   SNF   Routine Visit   HISTORY OF PRESENT ILLNESS:  This is a patient who came to [place] in February 2015.  He transferred from [facility] and had been previously there for 5-6 months.    He has a history of a dominant hemisphere CVA with right-sided weakness in the setting of hypertension, diabetes with chronic renal failure.    He has done nicely in this building.  He has had no recent problems.    PAST MEDICAL HISTORY/PROBLEM LIST:                 Hemiplegia following a CVA with right-sided weakness.    Hypertension.    Hyperlipidemia.    Type 2 diabetes.  On insulin.    Gastroesophageal reflux disease.    Urinary frequency.    History of iron deficiency.    History of depression.    CURRENT MEDICATIONS:  Medication list is reviewed.        ASA 81 q.d.     Zyrtec 10 mg daily.    Lisinopril 5 mg q.d.      Norvasc 2.5 q.d.       Zoloft 100 q.d.       Lasix 20 q.d.       Ferrous sulfate 325 twice daily.    Trusopt ophthalmic.    Flonase, both nostrils twice daily.      Amitiza 24 q.d.      Risperdal 0.5 under the tongue b.i.d.       Depakote 125 mg, 3 tablets/375 mg twice a day.    Protonix 40 q.d.       Lipitor 10 q.d.       Flomax 0.4 q.d.       Xalatan ophthalmic.    Humalog insulin 4 U subcutaneously every morning, 10 U with lunch and dinner.     Levemir 60 U at night.    LABORATORY DATA:  Recent lab work:    Hemoglobin A1c 7.7 on 06/30/2014.     PHYSICAL EXAMINATION:             CHEST/RESPIRATORY: Clear air entry bilaterally.  CARDIOVASCULAR:  CARDIAC: Heart sounds are normal. No bruits.  GASTROINTESTINAL:  ABDOMEN: Soft, nontender.           ASSESSMENT/PLAN:                    Late-effect dominant hemisphere CVA. No neurologic  changes.   Hypertension.   History of depression with anxiety. He remains on Risperdal 0.5 b.i.d. and Depakote. I am not exactly sure of the logic here for the Risperdal.      Type 2 diabetes. Recent hemoglobin A1c was 7.7 on 06/30/2014. He has had diabetic foot care and has had cataract surgery.

## 2014-10-12 ENCOUNTER — Encounter: Payer: Self-pay | Admitting: Internal Medicine

## 2014-11-04 ENCOUNTER — Non-Acute Institutional Stay (SKILLED_NURSING_FACILITY): Payer: Medicare Other | Admitting: Internal Medicine

## 2014-11-04 DIAGNOSIS — E1121 Type 2 diabetes mellitus with diabetic nephropathy: Secondary | ICD-10-CM | POA: Diagnosis not present

## 2014-11-04 DIAGNOSIS — I69359 Hemiplegia and hemiparesis following cerebral infarction affecting unspecified side: Secondary | ICD-10-CM

## 2014-11-06 NOTE — Progress Notes (Addendum)
Patient ID: Derek Crawford, male   DOB: [birth year], 81 y.o.   MRN: [id number]                PROGRESS NOTE  DATE:  11/04/2014          FACILITY: [facility]                    LEVEL OF CARE:   SNF   Routine Visit                        CHIEF COMPLAINT:  Routine visit/[employer] visit.      HISTORY OF PRESENT ILLNESS:  This is a patient who came to [place] in 2015, transferring from another nursing home.    He has a history of a dominant hemisphere CVA with right-sided weakness in the setting of hypertension, diabetes with chronic renal failure.    The patient is currently wheelchair-bound in the facility.    He has not had any recent problems.  He was felt to have allergic symptoms in March and his Simbrinza was stopped.    He has sensorineural hearing loss and was seen by Audiology.    PAST MEDICAL HISTORY/PROBLEM LIST:                    Hemiplegia following CVA with right-sided weakness.     Hypertension.    Hyperlipidemia.   Type 2 diabetes.  On insulin.    Gastroesophageal reflux disease.    Urinary frequency.    Iron deficiency.    History of depression.    CURRENT MEDICATIONS:  Medication list is reviewed.             ASA 81 q.d.       Zyrtec 10 q.d.      Prinivil 5 q.d.          Norvasc 2.5 q.d.      Zoloft 100 q.d.       Lasix 20 q.d.       Durezol ophthalmic.    Protonix 40 b.i.d.       Trusopt ophthalmic.     Lipitor 10 mg daily.      Flomax 0.4 daily.     Xalatan ophthalmic.    Levemir 64 U at h.s.     Humalog insulin 4 U in the morning, 10 U a.c. lunch and dinner.      LABORATORY DATA:   Recent lab work on 10/22/2014:    LDL cholesterol 46.    TSH 1.758.     Hemoglobin A1c 6.5.     CBC:  Hemoglobin 12.1 with microcytic, hypochromic indices.    Comprehensive metabolic panel:  BUN 11, creatinine 0.89.    Albumin 2.8.    Valproic acid level 32.    PHYSICAL EXAMINATION:   VITAL SIGNS:   O2 SATURATIONS:  96% on room air.     RESPIRATIONS:    16 and unlabored.    PULSE:     78.    BLOOD PRESSURE:  152/82.    WEIGHT:   This month, 225 pounds.   GENERAL APPEARANCE:  The patient is cooperative, with no overt distress.   CHEST/RESPIRATORY:  Shallow, but otherwise clear air entry bilaterally.    CARDIOVASCULAR:   CARDIAC:  Heart sounds are normal.  There is no JVP elevation.         ASSESSMENT/PLAN:  Late-effect dominant hemisphere CVA.  He has no neurologic changes.    Hypertension.  This is marginally controlled.     Type 2 diabetes.  His recent hemoglobin A1c was 6.5.    He has had ophthalmologic follow-up, diabetic foot care.  His hemoglobin A1c is under good control.    Allergic rhinitis.  This seems stable.    His hemoglobin A1c is 6.5.   This may be overtly aggressive.  He has had some marginal blood sugars.  This may need to be reduced.

## 2014-12-09 ENCOUNTER — Non-Acute Institutional Stay (SKILLED_NURSING_FACILITY): Payer: Medicare Other | Admitting: Internal Medicine

## 2014-12-09 DIAGNOSIS — I69359 Hemiplegia and hemiparesis following cerebral infarction affecting unspecified side: Secondary | ICD-10-CM

## 2014-12-09 DIAGNOSIS — E1121 Type 2 diabetes mellitus with diabetic nephropathy: Secondary | ICD-10-CM | POA: Diagnosis not present

## 2014-12-10 NOTE — Progress Notes (Addendum)
Patient ID: Derek Crawford, male   DOB: 1933/06/19, 81 y.o.   MRN: [id number]                PROGRESS NOTE  DATE:  12/09/2014         FACILITY: [facility]                       LEVEL OF CARE:   SNF   Routine Visit                   CHIEF COMPLAINT:  Routine visit/[employer] visit.      HISTORY OF PRESENT ILLNESS:  This is a patient who came to [place] in 2015, transferred from another nursing home.    He has a history of a dominant hemisphere CVA with right-sided weakness in the setting of hypertension, diabetes with chronic renal insufficiency.     He had his Levemir reduced to 62 U, but his fasting blood sugars were still marginal.  Noteworthy that his hemoglobin A1c was 6.5.  Therefore, his Lantus has been reduced to 55 U.    The patient is wheelchair-bound in the facility, but otherwise he is actually quite functional in his wheelchair.    PAST MEDICAL HISTORY/PROBLEM LIST:        Hemiplegia following a CVA with right-sided weakness.    Hypertension.    Hyperlipidemia.    Type 2 diabetes.  On insulin.    Gastroesophageal reflux disease.    Urinary frequency.    Iron deficiency anemia.    History of depression.    CURRENT MEDICATIONS:  Medication list is reviewed.                 Aspirin 81 q.d.      Zyrtec 10 q.d.      Prinivil 5 q.d.     Norvasc 2.5 q.d.       Lasix 20 q.d.      Ferrous sulfate 325 b.i.d.      Flonase twice daily for allergy.    Amitiza 24 mcg b.i.d.     Depakote 375 b.i.d.         Protonix 40 q.d.       Lipitor 10 q.d.       Flomax 0.4 q.d.       Xalatan ophthalmic 1 drop into each eye at bedtime.    Levemir, down to 55 U.       LABORATORY DATA:    Lab work from 10/22/2014:      LDL cholesterol 46.    TSH normal at 1.758.    Hemoglobin A1c 6.5.    CBC:   White count 4.8, hemoglobin, 12.1 with microcytic, hypochromic indices.     Comprehensive metabolic panel:  BUN 11, creatinine 0.89, potassium 3.9.     Albumin 2.8.      Valproic acid level 32.     PHYSICAL EXAMINATION:   CHEST/RESPIRATORY:  Shallow, but otherwise clear air entry.      CARDIOVASCULAR:   CARDIAC:  Heart sounds are normal.  There is no JVP elevation.    ASSESSMENT/PLAN:                        Late-effect dominant hemisphere CVA.  No neurologic changes.    Type 2 diabetes.  This is actually under good control.  We have been able to back off on his insulin.   Hypertension.  This appears to be  fairly well controlled.  Last blood pressure was 122/55.

## 2015-02-24 ENCOUNTER — Non-Acute Institutional Stay (SKILLED_NURSING_FACILITY): Payer: Medicare Other | Admitting: Internal Medicine

## 2015-02-24 DIAGNOSIS — E1121 Type 2 diabetes mellitus with diabetic nephropathy: Secondary | ICD-10-CM | POA: Diagnosis not present

## 2015-02-24 DIAGNOSIS — N183 Chronic kidney disease, stage 3 (moderate): Secondary | ICD-10-CM | POA: Diagnosis not present

## 2015-03-03 NOTE — Progress Notes (Addendum)
Patient ID: Derek Crawford, male   DOB: July 20, 1932, 81 y.o.   MRN: [id number]                PROGRESS NOTE  DATE:  02/24/2015           FACILITY: [facility]            LEVEL OF CARE:   SNF   Routine Visit                    CHIEF COMPLAINT:  Routine visit/[facility] visit.      HISTORY OF PRESENT ILLNESS:  This is a patient who came to [place] in 2015, transferred from another facility.    His major disability is secondary to a history of a dominant hemisphere CVA with right-sided weakness in the setting of hypertension, diabetes, and chronic renal insufficiency.    In June, he had fasting blood sugars below 50 on two occasions and his Lantus dose was reduced.  His appetite is stable.   There have been no other issues.    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     Hypertension.    Hyperlipidemia.      Type 2 diabetes.  On insulin.    Gastroesophageal reflux disease.    Urinary frequency.       Iron deficiency anemia.    History of depression.      SOCIAL HISTORY:   ADVANCED DIRECTIVES:  The patient is a Full Code, aggressive care.    CURRENT MEDICATIONS:  Medication list is reviewed.              Aspirin 81 q.d.      Zyrtec 10 q.d.       Prinivil 5 q.d.       Norvasc 2.5 q.d.        Lasix 20 q.d.       Protonix 40 q.d.       Ferrous sulfate 325 b.i.d.      Flonase 1 spray in each nostril twice daily.      Amitiza 24 daily.      Depakote 375 mg twice daily.     Lipitor 10 q.d.      Flomax 0.4 q.d.      Xalatan ophthalmic.    Lantus insulin 40 U subcu q.h.s.       LABORATORY DATA:    02/04/2015:   Hemoglobin A1c was 6.1.         10/22/2014:    His BUN and creatinine were normal at 11 and 0.89, respectively.    CBC:  White count 12.1 with an MCV of 77.7.    REVIEW OF SYSTEMS:    GENERAL:  There has been no change in his functional status.   CHEST/RESPIRATORY:  No shortness of breath.        CARDIAC:  No chest pain.   GI:  Constipation.  Appetite 75-100%.   GU:  No obvious complaints of urinary retention.    PHYSICAL EXAMINATION:   VITAL SIGNS:     RESPIRATIONS:  18.   BLOOD PRESSURE:  136/82.     GENERAL APPEARANCE:  The patient is in a wheelchair.  He is bright and active.   CHEST/RESPIRATORY:  Clear air entry bilaterally.    CARDIOVASCULAR:   CARDIAC:  Heart sounds are normal.  There are no murmurs.    EDEMA/VARICOSITIES:  Extremities:  He has  mild lower extremity edema bilaterally.    GASTROINTESTINAL:   ABDOMEN:  Bowel sounds are positive.  No tenderness.  No masses.    MUSCULOSKELETAL:   EXTREMITIES:  Generalized weakness.  Minimal motion of the left lower extremity.  None on the right.  Very limited right arm movement.    PSYCHIATRIC:   MENTAL STATUS:  He is calm and cooperative.    ASSESSMENT/PLAN:             Type 2 diabetes with chronic renal disease.  His hemoglobin A1c's have been low and Lantus insulin has been appropriately reduced.    Chronic renal failure stage III.  He recently had his Lasix increased due to lower extremity edema.    Hypertension.  This has been largely controlled.

## 2015-09-09 LAB — CBC AND DIFFERENTIAL: Hemoglobin: 12.2 g/dL — AB (ref 13.5–17.5)

## 2015-09-09 LAB — HEMOGLOBIN A1C: Hemoglobin A1C: 6.5

## 2015-10-09 LAB — MICROALBUMIN, URINE: MICROALB UR: 8

## 2015-11-09 ENCOUNTER — Non-Acute Institutional Stay (SKILLED_NURSING_FACILITY): Payer: Medicare Other | Admitting: Internal Medicine

## 2015-11-09 DIAGNOSIS — D509 Iron deficiency anemia, unspecified: Secondary | ICD-10-CM | POA: Diagnosis not present

## 2015-11-09 DIAGNOSIS — F329 Major depressive disorder, single episode, unspecified: Secondary | ICD-10-CM | POA: Diagnosis not present

## 2015-11-09 DIAGNOSIS — E785 Hyperlipidemia, unspecified: Secondary | ICD-10-CM

## 2015-11-09 DIAGNOSIS — R4589 Other symptoms and signs involving emotional state: Secondary | ICD-10-CM

## 2015-11-09 DIAGNOSIS — G8190 Hemiplegia, unspecified affecting unspecified side: Secondary | ICD-10-CM | POA: Diagnosis not present

## 2015-11-09 DIAGNOSIS — I2510 Atherosclerotic heart disease of native coronary artery without angina pectoris: Secondary | ICD-10-CM | POA: Diagnosis not present

## 2015-11-09 DIAGNOSIS — I10 Essential (primary) hypertension: Secondary | ICD-10-CM | POA: Diagnosis not present

## 2015-11-09 NOTE — Progress Notes (Signed)
Patient ID: Derek Crawford, male   DOB: 1933/06/11, 81 y.o.   MRN: [id number]  Location:  [facility] Room Number: 106 Place of Service:  SNF ([phone]) Provider:  Estill Dooms, MD  Patient Care Team: Estill Dooms, MD as PCP - General (Internal Medicine)  Extended Emergency Contact Information Primary Emergency Contact: Brugh,[name] Address: [address]          [place], [place] [address] Johnnette Litter of [place] Phone: [phone] Relation: Other  Code Status:  full Goals of care: Advanced Directive information Advanced Directives 11/09/2015  Does patient have an advance directive? No  Pre-existing out of facility DNR order (yellow form or pink MOST form) -     Chief Complaint  Patient presents with  . Medical Management of Chronic Issues    medication management    HPI:  Pt is a 82 y.o. male seen today for medical management of chronic diseases.    Dyslipidemia - No recent lipid panels although he is on Lipitor.  Iron deficiency anemia - last hemoglobin 12.2  Depressed mood - patient denies any significant problems of depression at this time.  Atherosclerosis of native coronary artery of native heart without angina pectoris - asymptomatic  Hemiplegia ([place]) - unchanged right hemiparesis  Essential hypertension - controlled    Past Medical History  Diagnosis Date  . Hemiplegia following CVA (cerebrovascular accident) ([place])     R side weakness  . Hypertension   . Hyperlipidemia   . Diabetes mellitus without complication ([place])   . GERD (gastroesophageal reflux disease)   . Urinary incontinence   . Iron deficiency anemia   . Glaucoma   . Muscle weakness    Past Surgical History  Procedure Laterality Date  . Circumcision    . Cataract extraction Left   . Tonsillectomy    . Esophagogastroduodenoscopy N/A 08/23/2013    Procedure: ESOPHAGOGASTRODUODENOSCOPY (EGD);  Surgeon: Gatha Mayer, MD;  Location: [facility];  Service:  Endoscopy;  Laterality: N/A;  . Colonoscopy N/A 08/23/2013    Procedure: COLONOSCOPY;  Surgeon: Gatha Mayer, MD;  Location: [facility];  Service: Endoscopy;  Laterality: N/A;    No Known Allergies    Medication List       This list is accurate as of: 11/09/15  3:22 PM.  Always use your most recent med list.               amLODipine 2.5 MG tablet  Commonly known as:  NORVASC  Take 2.5 mg by mouth daily. Hold for BP<100/60 and HR <60     aspirin 81 MG tablet  Take 81 mg by mouth daily.     atorvastatin 10 MG tablet  Commonly known as:  LIPITOR  Take 10 mg by mouth daily. Take one tablet at bedtime for hyperlipidemia     CERTAGEN SILVER PO  Take by mouth. Take one tablet daily     cetirizine 10 MG tablet  Commonly known as:  ZYRTEC  Take 10 mg by mouth daily.     divalproex 250 MG DR tablet  Commonly known as:  DEPAKOTE  Take 500 mg by mouth 2 (two) times daily.     ferrous sulfate 325 (65 FE) MG tablet  Take 325 mg by mouth. Take one tablet daily     fluticasone 50 MCG/ACT nasal spray  Commonly known as:  FLONASE  Place 1 spray into the nose 2 (two) times daily.     furosemide  40 MG tablet  Commonly known as:  LASIX  Take 40 mg by mouth daily.     LANTUS 100 UNIT/ML injection  Generic drug:  insulin glargine  Inject 35 Units into the skin. Inject every morning     latanoprost 0.005 % ophthalmic solution  Commonly known as:  XALATAN  Place 1 drop into both eyes at bedtime.     lisinopril 5 MG tablet  Commonly known as:  PRINIVIL,ZESTRIL  Take 5 mg by mouth daily.     lubiprostone 24 MCG capsule  Commonly known as:  AMITIZA  Take 24 mcg by mouth 2 (two) times daily with a meal.     pantoprazole 40 MG tablet  Commonly known as:  PROTONIX  Take 40 mg by mouth daily.     PRO-STAT PO  Take by mouth. Take 30 ml twice daily due to low albumin     tamsulosin 0.4 MG Caps capsule  Commonly known as:  FLOMAX  Take 0.4 mg by mouth daily.         Review of Systems  Constitutional: Negative for fever and chills.  Respiratory: Positive for cough. Negative for shortness of breath and wheezing.   Cardiovascular: Negative for chest pain and palpitations.  Gastrointestinal: Negative for abdominal pain, diarrhea and constipation.       GERD. Hiatal hernia. EGD 2015. Colonoscopy 2015: Large polyp removed.  Genitourinary: Negative for dysuria, urgency and frequency.  Musculoskeletal: Negative for myalgias.  Skin: Negative.  Negative for rash.  Neurological: Negative for dizziness, tremors, weakness and headaches.       History of CVA and right hemiplegia  Hematological:       History of anemia  Psychiatric/Behavioral: Positive for confusion. Negative for suicidal ideas and hallucinations. The patient is not nervous/anxious.     Immunization History  Administered Date(s) Administered  . Influenza-Unspecified 04/10/2013, 04/20/2015  . Pneumococcal-Unspecified 05/17/2013   Pertinent  Health Maintenance Due  Topic Date Due  . PNA vac Low Risk Adult (2 of 2 - PCV13) 05/17/2014  . HEMOGLOBIN A1C  06/12/2014  . OPHTHALMOLOGY EXAM  01/21/2015  . FOOT EXAM  01/23/2015  . INFLUENZA VACCINE  02/09/2016   No flowsheet data found. Functional Status Survey:    Filed Vitals:   11/09/15 1506  BP: 138/70  Pulse: 83  Temp: 97.5 F (36.4 C)  Resp: 20  Height: 6\' 1"  (1.854 m)  Weight: 203 lb (92.08 kg)  SpO2: 96%   Body mass index is 26.79 kg/(m^2). Physical Exam  Constitutional: He is oriented to person, place, and time. He appears well-developed and well-nourished. No distress.  HENT:  Right Ear: External ear normal.  Left Ear: External ear normal.  Nose: Nose normal.  Mouth/Throat: Oropharynx is clear and moist. No oropharyngeal exudate.  Eyes: Conjunctivae and EOM are normal. Pupils are equal, round, and reactive to light.  Neck: No JVD present. No tracheal deviation present. No thyromegaly present.  Cardiovascular: Normal  rate, regular rhythm, normal heart sounds and intact distal pulses.  Exam reveals no gallop and no friction rub.   No murmur heard. Pulmonary/Chest: No respiratory distress. He has no wheezes. He has no rales. He exhibits no tenderness.  Abdominal: He exhibits no distension and no mass. There is no tenderness.  Musculoskeletal: Normal range of motion. He exhibits edema. He exhibits no tenderness.  Rides wheelchair  Lymphadenopathy:    He has no cervical adenopathy.  Neurological: He is alert and oriented to person, place, and time. No cranial  nerve deficit. Coordination abnormal.  Right hemiplegia  Skin: No rash noted. No erythema. No pallor.  Psychiatric: He has a normal mood and affect. His behavior is normal. Judgment and thought content normal.    Labs reviewed:  No results for input(s): AST, ALT, ALKPHOS, BILITOT, PROT, ALBUMIN in the last 8760 hours.  Lab Results  Component Value Date   TSH 1.02 09/12/2006   Lab Results  Component Value Date   HGBA1C 6.5 09/09/2015   09/09/15 CBC (without Differential) White Blood Cell (WBC) 5.5 k/uL 3.8-10.8 Final Red Blood Cell (RBC) 4.6 M/uL 4.3-6.0 Final Hemoglobin (HGB) 12.2 g/dL 13.0-18.0 L Final Hematocrit (HCT) 37.8 % 39.0-54.0 L Final Mean Corpuscular Volume (MCV) 82.5 fL 79.0-100.0 Final Mean Corpuscular HGB (MCH) 26.6 pg 26.8-33.2 L Final Mean Corpuscular HGB Conc (MCHC) 32.3 g/dL 30.5-36.0 Final RBC Dist Width (RDW) 14.4 % 9.0-15.0 Final Platelet 167 k/uL 150-450 Final  TSH, 3rd Generation 3.305 uIU/mL 0.300-5.000 Final   Comprehensive Metabolic Panel Sodium 123456 mmol/L 135-146 Final Potassium 3.8 mmol/L 3.5-5.3 Final Chloride 108 mmol/L 95-109 Final CO2 32.5 mmol/L 21.0-31.0 H Final Anion Gap 4 meq/L 5-21 L Final Glucose 60 mg/dL 70-105 L Final Urea Nitrogen (BUN) 18 mg/dL 5-25 Final Creatinine, Serum 1.07 mg/dL 0.60-1.30 Final BUN/Creatinine Ratio 16.8 Ratio 6.0-25.0 Final Calcium 8.1 mg/dL 8.2-10.5 L Final Protein,  Total 5.9 g/dL 6.0-8.3 L Final Albumin 3.06 g/dL 3.50-5.50 L Final Globulin 2.8 g/dL 1.5-4.5 Final Albumin/Globulin Ratio 1.1 Ratio 0.8-2.5 Final Bilirubin, Total 0.40 mg/dL 0.00-1.40 Final Alkaline Phosphatase 52 U/L 41-130 Final AST (SGOT) 14 U/L 0-39 Final ALT (SGPT) 17 U/L 0-45 Final Glomerular Filtration Rate > 60 mls/min/1.73 m2 > 60 Final Glomerular Filtration Rate - African American > 60 mls/min/1.73 m2 > 60 Final  Hemoglobin A1c 6.5 % 5.0-6.1 H Final   Assessment/Plan 1. Dyslipidemia -Lipid panel  2. Iron deficiency anemia -Discontinued her sulfate Pending lab includes a serum iron, TIBC, ferritin, B12, folate, VPA level, lipid panel, and magnesium.  3. Depressed mood Improved  4. Atherosclerosis of native coronary artery of native heart without angina pectoris Asymptomatic   5. Hemiplegia ([facility]) Unchanged  6. Hyperlipidemia Lipid panel   7. Essential hypertension Continue current medications

## 2016-01-01 ENCOUNTER — Other Ambulatory Visit (HOSPITAL_COMMUNITY): Payer: Self-pay | Admitting: Internal Medicine

## 2016-01-01 DIAGNOSIS — R1310 Dysphagia, unspecified: Secondary | ICD-10-CM

## 2016-01-08 ENCOUNTER — Other Ambulatory Visit (HOSPITAL_COMMUNITY): Payer: Medicare Other

## 2016-01-08 ENCOUNTER — Ambulatory Visit (HOSPITAL_COMMUNITY): Admission: RE | Admit: 2016-01-08 | Payer: Medicare Other | Source: Ambulatory Visit

## 2016-01-15 ENCOUNTER — Ambulatory Visit (HOSPITAL_COMMUNITY)
Admission: RE | Admit: 2016-01-15 | Discharge: 2016-01-15 | Disposition: A | Discharge status: Home / Self Care | Payer: Medicare Other | Source: Ambulatory Visit | Attending: Internal Medicine | Admitting: Internal Medicine

## 2016-01-15 DIAGNOSIS — R1310 Dysphagia, unspecified: Secondary | ICD-10-CM

## 2016-01-15 DIAGNOSIS — M6281 Muscle weakness (generalized): Secondary | ICD-10-CM | POA: Diagnosis not present

## 2016-01-15 DIAGNOSIS — I10 Essential (primary) hypertension: Secondary | ICD-10-CM | POA: Diagnosis not present

## 2016-01-15 DIAGNOSIS — K449 Diaphragmatic hernia without obstruction or gangrene: Secondary | ICD-10-CM | POA: Diagnosis not present

## 2016-01-15 DIAGNOSIS — I69351 Hemiplegia and hemiparesis following cerebral infarction affecting right dominant side: Secondary | ICD-10-CM | POA: Diagnosis not present

## 2016-01-15 DIAGNOSIS — H409 Unspecified glaucoma: Secondary | ICD-10-CM | POA: Diagnosis not present

## 2016-01-15 DIAGNOSIS — R32 Unspecified urinary incontinence: Secondary | ICD-10-CM | POA: Diagnosis not present

## 2016-01-15 DIAGNOSIS — R05 Cough: Secondary | ICD-10-CM | POA: Diagnosis present

## 2016-01-15 DIAGNOSIS — D509 Iron deficiency anemia, unspecified: Secondary | ICD-10-CM | POA: Insufficient documentation

## 2016-01-15 DIAGNOSIS — R1312 Dysphagia, oropharyngeal phase: Secondary | ICD-10-CM | POA: Insufficient documentation

## 2016-01-15 DIAGNOSIS — E119 Type 2 diabetes mellitus without complications: Secondary | ICD-10-CM | POA: Insufficient documentation

## 2016-01-15 DIAGNOSIS — Z8673 Personal history of transient ischemic attack (TIA), and cerebral infarction without residual deficits: Secondary | ICD-10-CM | POA: Diagnosis not present

## 2016-01-15 DIAGNOSIS — K219 Gastro-esophageal reflux disease without esophagitis: Secondary | ICD-10-CM | POA: Insufficient documentation

## 2016-01-15 DIAGNOSIS — E785 Hyperlipidemia, unspecified: Secondary | ICD-10-CM | POA: Diagnosis not present

## 2017-02-22 ENCOUNTER — Emergency Department (HOSPITAL_COMMUNITY): Payer: Medicare Other

## 2017-02-22 ENCOUNTER — Inpatient Hospital Stay (HOSPITAL_COMMUNITY): Payer: Medicare Other

## 2017-02-22 ENCOUNTER — Inpatient Hospital Stay (HOSPITAL_COMMUNITY)
Admission: EM | Admit: 2017-02-22 | Discharge: 2017-03-14 | DRG: 870 | Disposition: A | Discharge status: Skilled Nursing Facility | Payer: Medicare Other | Attending: Internal Medicine | Admitting: Internal Medicine

## 2017-02-22 ENCOUNTER — Encounter (HOSPITAL_COMMUNITY): Payer: Self-pay | Admitting: Radiology

## 2017-02-22 DIAGNOSIS — R1312 Dysphagia, oropharyngeal phase: Secondary | ICD-10-CM | POA: Diagnosis present

## 2017-02-22 DIAGNOSIS — A419 Sepsis, unspecified organism: Secondary | ICD-10-CM | POA: Diagnosis present

## 2017-02-22 DIAGNOSIS — Z993 Dependence on wheelchair: Secondary | ICD-10-CM

## 2017-02-22 DIAGNOSIS — D509 Iron deficiency anemia, unspecified: Secondary | ICD-10-CM | POA: Diagnosis present

## 2017-02-22 DIAGNOSIS — R569 Unspecified convulsions: Secondary | ICD-10-CM | POA: Diagnosis present

## 2017-02-22 DIAGNOSIS — K567 Ileus, unspecified: Secondary | ICD-10-CM | POA: Diagnosis not present

## 2017-02-22 DIAGNOSIS — Z4682 Encounter for fitting and adjustment of non-vascular catheter: Secondary | ICD-10-CM

## 2017-02-22 DIAGNOSIS — D696 Thrombocytopenia, unspecified: Secondary | ICD-10-CM | POA: Diagnosis not present

## 2017-02-22 DIAGNOSIS — E46 Unspecified protein-calorie malnutrition: Secondary | ICD-10-CM | POA: Diagnosis present

## 2017-02-22 DIAGNOSIS — Y95 Nosocomial condition: Secondary | ICD-10-CM | POA: Diagnosis present

## 2017-02-22 DIAGNOSIS — Z794 Long term (current) use of insulin: Secondary | ICD-10-CM

## 2017-02-22 DIAGNOSIS — Z79899 Other long term (current) drug therapy: Secondary | ICD-10-CM | POA: Diagnosis not present

## 2017-02-22 DIAGNOSIS — R935 Abnormal findings on diagnostic imaging of other abdominal regions, including retroperitoneum: Secondary | ICD-10-CM

## 2017-02-22 DIAGNOSIS — Z452 Encounter for adjustment and management of vascular access device: Secondary | ICD-10-CM

## 2017-02-22 DIAGNOSIS — R6521 Severe sepsis with septic shock: Secondary | ICD-10-CM | POA: Diagnosis present

## 2017-02-22 DIAGNOSIS — E876 Hypokalemia: Secondary | ICD-10-CM | POA: Diagnosis not present

## 2017-02-22 DIAGNOSIS — Z4659 Encounter for fitting and adjustment of other gastrointestinal appliance and device: Secondary | ICD-10-CM

## 2017-02-22 DIAGNOSIS — Z515 Encounter for palliative care: Secondary | ICD-10-CM | POA: Diagnosis present

## 2017-02-22 DIAGNOSIS — Z9861 Coronary angioplasty status: Secondary | ICD-10-CM | POA: Diagnosis not present

## 2017-02-22 DIAGNOSIS — N179 Acute kidney failure, unspecified: Secondary | ICD-10-CM | POA: Diagnosis present

## 2017-02-22 DIAGNOSIS — J9600 Acute respiratory failure, unspecified whether with hypoxia or hypercapnia: Secondary | ICD-10-CM

## 2017-02-22 DIAGNOSIS — H409 Unspecified glaucoma: Secondary | ICD-10-CM | POA: Diagnosis present

## 2017-02-22 DIAGNOSIS — I5032 Chronic diastolic (congestive) heart failure: Secondary | ICD-10-CM | POA: Diagnosis not present

## 2017-02-22 DIAGNOSIS — F39 Unspecified mood [affective] disorder: Secondary | ICD-10-CM | POA: Diagnosis present

## 2017-02-22 DIAGNOSIS — E872 Acidosis: Secondary | ICD-10-CM | POA: Diagnosis present

## 2017-02-22 DIAGNOSIS — K449 Diaphragmatic hernia without obstruction or gangrene: Secondary | ICD-10-CM | POA: Diagnosis present

## 2017-02-22 DIAGNOSIS — E785 Hyperlipidemia, unspecified: Secondary | ICD-10-CM | POA: Diagnosis present

## 2017-02-22 DIAGNOSIS — I248 Other forms of acute ischemic heart disease: Secondary | ICD-10-CM | POA: Diagnosis not present

## 2017-02-22 DIAGNOSIS — E119 Type 2 diabetes mellitus without complications: Secondary | ICD-10-CM | POA: Diagnosis present

## 2017-02-22 DIAGNOSIS — Z7982 Long term (current) use of aspirin: Secondary | ICD-10-CM

## 2017-02-22 DIAGNOSIS — G9341 Metabolic encephalopathy: Secondary | ICD-10-CM | POA: Diagnosis present

## 2017-02-22 DIAGNOSIS — R1310 Dysphagia, unspecified: Secondary | ICD-10-CM | POA: Diagnosis not present

## 2017-02-22 DIAGNOSIS — J690 Pneumonitis due to inhalation of food and vomit: Secondary | ICD-10-CM | POA: Diagnosis present

## 2017-02-22 DIAGNOSIS — R140 Abdominal distension (gaseous): Secondary | ICD-10-CM | POA: Diagnosis not present

## 2017-02-22 DIAGNOSIS — K219 Gastro-esophageal reflux disease without esophagitis: Secondary | ICD-10-CM | POA: Diagnosis present

## 2017-02-22 DIAGNOSIS — K560 Paralytic ileus: Secondary | ICD-10-CM | POA: Diagnosis present

## 2017-02-22 DIAGNOSIS — Z7189 Other specified counseling: Secondary | ICD-10-CM

## 2017-02-22 DIAGNOSIS — J9601 Acute respiratory failure with hypoxia: Secondary | ICD-10-CM | POA: Diagnosis present

## 2017-02-22 DIAGNOSIS — Z87891 Personal history of nicotine dependence: Secondary | ICD-10-CM

## 2017-02-22 DIAGNOSIS — I69351 Hemiplegia and hemiparesis following cerebral infarction affecting right dominant side: Secondary | ICD-10-CM

## 2017-02-22 DIAGNOSIS — J9690 Respiratory failure, unspecified, unspecified whether with hypoxia or hypercapnia: Secondary | ICD-10-CM | POA: Diagnosis present

## 2017-02-22 DIAGNOSIS — I10 Essential (primary) hypertension: Secondary | ICD-10-CM | POA: Diagnosis present

## 2017-02-22 LAB — BLOOD GAS, ARTERIAL
Acid-base deficit: 8.2 mmol/L — ABNORMAL HIGH (ref 0.0–2.0)
Acid-base deficit: 4.2 mmol/L — ABNORMAL HIGH (ref 0.0–2.0)
BICARBONATE: 17.9 mmol/L — AB (ref 20.0–28.0)
BICARBONATE: 21.8 mmol/L (ref 20.0–28.0)
DELIVERY SYSTEMS: POSITIVE
DRAWN BY: 422461
Drawn by: 441261
Expiratory PAP: 6
FIO2: 80
FIO2: 100
INSPIRATORY PAP: 16
MECHVT: 570 mL
Mode: POSITIVE
O2 Saturation: 97 %
O2 Saturation: 99 %
PATIENT TEMPERATURE: 97.8
PATIENT TEMPERATURE: 98.6
PCO2 ART: 39.6 mmHg (ref 32.0–48.0)
PCO2 ART: 46.2 mmHg (ref 32.0–48.0)
PEEP: 5 cmH2O
PO2 ART: 116 mmHg — AB (ref 83.0–108.0)
PO2 ART: 225 mmHg — AB (ref 83.0–108.0)
RATE: 8 resp/min
RATE: 14 resp/min
pH, Arterial: 7.275 — ABNORMAL LOW (ref 7.350–7.450)
pH, Arterial: 7.296 — ABNORMAL LOW (ref 7.350–7.450)

## 2017-02-22 LAB — COMPREHENSIVE METABOLIC PANEL
ALK PHOS: 72 U/L (ref 38–126)
ALT: 14 U/L — ABNORMAL LOW (ref 17–63)
ANION GAP: 20 — AB (ref 5–15)
AST: 27 U/L (ref 15–41)
Albumin: 4.5 g/dL (ref 3.5–5.0)
BILIRUBIN TOTAL: 0.4 mg/dL (ref 0.3–1.2)
BUN: 60 mg/dL — ABNORMAL HIGH (ref 6–20)
CALCIUM: 9.3 mg/dL (ref 8.9–10.3)
CO2: 20 mmol/L — ABNORMAL LOW (ref 22–32)
Chloride: 100 mmol/L — ABNORMAL LOW (ref 101–111)
Creatinine, Ser: 3.65 mg/dL — ABNORMAL HIGH (ref 0.61–1.24)
GFR, EST AFRICAN AMERICAN: 16 mL/min — AB (ref >60)
GFR, EST NON AFRICAN AMERICAN: 14 mL/min — AB (ref >60)
GLUCOSE: 256 mg/dL — AB (ref 65–99)
POTASSIUM: 3.6 mmol/L (ref 3.5–5.1)
Sodium: 140 mmol/L (ref 135–145)
TOTAL PROTEIN: 8.8 g/dL — AB (ref 6.5–8.1)

## 2017-02-22 LAB — VALPROIC ACID LEVEL: VALPROIC ACID LVL: 43 ug/mL — AB (ref 50.0–100.0)

## 2017-02-22 LAB — GLUCOSE, CAPILLARY
GLUCOSE-CAPILLARY: 199 mg/dL — AB (ref 65–99)
GLUCOSE-CAPILLARY: 175 mg/dL — AB (ref 65–99)
GLUCOSE-CAPILLARY: 123 mg/dL — AB (ref 65–99)
Glucose-Capillary: 139 mg/dL — ABNORMAL HIGH (ref 65–99)

## 2017-02-22 LAB — PROCALCITONIN: PROCALCITONIN: 1.54 ng/mL

## 2017-02-22 LAB — CBC WITH DIFFERENTIAL/PLATELET
Basophils Absolute: 0 10*3/uL (ref 0.0–0.1)
Basophils Relative: 0 %
EOS PCT: 0 %
Eosinophils Absolute: 0 10*3/uL (ref 0.0–0.7)
HCT: 43.1 % (ref 39.0–52.0)
HEMOGLOBIN: 14.6 g/dL (ref 13.0–17.0)
LYMPHS ABS: 1 10*3/uL (ref 0.7–4.0)
LYMPHS PCT: 6 %
MCH: 27.4 pg (ref 26.0–34.0)
MCHC: 33.9 g/dL (ref 30.0–36.0)
MCV: 81 fL (ref 78.0–100.0)
Monocytes Absolute: 1.4 10*3/uL — ABNORMAL HIGH (ref 0.1–1.0)
Monocytes Relative: 9 %
Neutro Abs: 13.6 10*3/uL — ABNORMAL HIGH (ref 1.7–7.7)
Neutrophils Relative %: 85 %
PLATELETS: 276 10*3/uL (ref 150–400)
RBC: 5.32 MIL/uL (ref 4.22–5.81)
RDW: 13.6 % (ref 11.5–15.5)
WBC: 16 10*3/uL — AB (ref 4.0–10.5)

## 2017-02-22 LAB — URINALYSIS, ROUTINE W REFLEX MICROSCOPIC
BILIRUBIN URINE: NEGATIVE
Glucose, UA: NEGATIVE mg/dL
KETONES UR: 5 mg/dL — AB
NITRITE: NEGATIVE
PROTEIN: 100 mg/dL — AB
Specific Gravity, Urine: 1.024 (ref 1.005–1.030)
pH: 5 (ref 5.0–8.0)

## 2017-02-22 LAB — MRSA PCR SCREENING: MRSA by PCR: NEGATIVE

## 2017-02-22 LAB — TROPONIN I
TROPONIN I: 0.06 ng/mL — AB (ref <0.03)
Troponin I: 0.08 ng/mL (ref <0.03)

## 2017-02-22 LAB — I-STAT CG4 LACTIC ACID, ED
LACTIC ACID, VENOUS: 5.35 mmol/L — AB (ref 0.5–1.9)
LACTIC ACID, VENOUS: 4.27 mmol/L — AB (ref 0.5–1.9)

## 2017-02-22 LAB — PROTIME-INR
INR: 1
INR: 1.1
PROTHROMBIN TIME: 14.2 s (ref 11.4–15.2)
Prothrombin Time: 13.2 seconds (ref 11.4–15.2)

## 2017-02-22 LAB — C DIFFICILE QUICK SCREEN W PCR REFLEX
C DIFFICILE (CDIFF) INTERP: NOT DETECTED
C DIFFICILE (CDIFF) TOXIN: NEGATIVE
C DIFFICLE (CDIFF) ANTIGEN: NEGATIVE

## 2017-02-22 LAB — ECHOCARDIOGRAM COMPLETE
Height: 69 in
WEIGHTICAEL: 3200 [oz_av]

## 2017-02-22 LAB — TRIGLYCERIDES: Triglycerides: 122 mg/dL (ref <150)

## 2017-02-22 LAB — CBG MONITORING, ED: GLUCOSE-CAPILLARY: 192 mg/dL — AB (ref 65–99)

## 2017-02-22 LAB — LACTIC ACID, PLASMA: LACTIC ACID, VENOUS: 2.5 mmol/L — AB (ref 0.5–1.9)

## 2017-02-22 MED ORDER — SODIUM CHLORIDE 0.9 % IV BOLUS (SEPSIS)
1000.0000 mL | Freq: Once | INTRAVENOUS | Status: AC
  Administered 2017-02-22: 1000 mL via INTRAVENOUS

## 2017-02-22 MED ORDER — PROPOFOL 1000 MG/100ML IV EMUL
0.0000 ug/kg/min | INTRAVENOUS | Status: DC
  Administered 2017-02-22: 5 ug/kg/min via INTRAVENOUS
  Administered 2017-02-22: 15 ug/kg/min via INTRAVENOUS
  Administered 2017-02-23: 10 ug/kg/min via INTRAVENOUS
  Administered 2017-02-24: 15 ug/kg/min via INTRAVENOUS
  Administered 2017-02-24: 5 ug/kg/min via INTRAVENOUS
  Administered 2017-02-25: 10 ug/kg/min via INTRAVENOUS
  Filled 2017-02-22 (×7): qty 100

## 2017-02-22 MED ORDER — FAMOTIDINE IN NACL 20-0.9 MG/50ML-% IV SOLN
20.0000 mg | Freq: Two times a day (BID) | INTRAVENOUS | Status: DC
  Administered 2017-02-22: 20 mg via INTRAVENOUS
  Filled 2017-02-22: qty 50

## 2017-02-22 MED ORDER — ALBUTEROL SULFATE (2.5 MG/3ML) 0.083% IN NEBU: 2.5000 mg | INHALATION_SOLUTION | Freq: Four times a day (QID) | RESPIRATORY_TRACT | Status: DC | PRN

## 2017-02-22 MED ORDER — ASPIRIN 81 MG PO CHEW: 81.0000 mg | CHEWABLE_TABLET | Freq: Every day | ORAL | Status: DC

## 2017-02-22 MED ORDER — PIPERACILLIN-TAZOBACTAM 3.375 G IVPB 30 MIN
3.3750 g | Freq: Once | INTRAVENOUS | Status: AC
  Administered 2017-02-22: 3.375 g via INTRAVENOUS
  Filled 2017-02-22: qty 50

## 2017-02-22 MED ORDER — ALBUTEROL SULFATE (2.5 MG/3ML) 0.083% IN NEBU
INHALATION_SOLUTION | RESPIRATORY_TRACT | Status: AC
  Administered 2017-02-22: 14:00:00
  Filled 2017-02-22: qty 3

## 2017-02-22 MED ORDER — INSULIN ASPART 100 UNIT/ML ~~LOC~~ SOLN
2.0000 [IU] | SUBCUTANEOUS | Status: DC
  Administered 2017-02-22: 2 [IU] via SUBCUTANEOUS
  Administered 2017-02-22 (×3): 4 [IU] via SUBCUTANEOUS
  Administered 2017-02-22 – 2017-02-27 (×8): 2 [IU] via SUBCUTANEOUS
  Administered 2017-02-27: 4 [IU] via SUBCUTANEOUS
  Administered 2017-02-27: 2 [IU] via SUBCUTANEOUS
  Administered 2017-02-27: 4 [IU] via SUBCUTANEOUS
  Filled 2017-02-22 (×2): qty 1

## 2017-02-22 MED ORDER — METOCLOPRAMIDE HCL 5 MG/ML IJ SOLN
10.0000 mg | Freq: Four times a day (QID) | INTRAMUSCULAR | Status: DC | PRN
Start: 2017-02-22 — End: 2017-02-23

## 2017-02-22 MED ORDER — FENTANYL BOLUS VIA INFUSION
25.0000 ug | INTRAVENOUS | Status: DC | PRN
  Administered 2017-02-22 – 2017-02-23 (×2): 25 ug via INTRAVENOUS
  Filled 2017-02-22: qty 25

## 2017-02-22 MED ORDER — DEXTROSE 5 % IV SOLN
2.0000 g | INTRAVENOUS | Status: DC
  Administered 2017-02-22 – 2017-02-25 (×4): 2 g via INTRAVENOUS
  Filled 2017-02-22 (×4): qty 2

## 2017-02-22 MED ORDER — CHLORHEXIDINE GLUCONATE 0.12% ORAL RINSE (MEDLINE KIT)
15.0000 mL | Freq: Two times a day (BID) | OROMUCOSAL | Status: DC
  Administered 2017-02-22 – 2017-03-01 (×14): 15 mL via OROMUCOSAL

## 2017-02-22 MED ORDER — LACTATED RINGERS IV SOLN
INTRAVENOUS | Status: AC
  Administered 2017-02-22 – 2017-02-25 (×6): via INTRAVENOUS

## 2017-02-22 MED ORDER — MIDAZOLAM HCL 2 MG/2ML IJ SOLN
INTRAMUSCULAR | Status: AC
  Administered 2017-02-22: 2 mg
  Filled 2017-02-22: qty 2

## 2017-02-22 MED ORDER — VALPROATE SODIUM 500 MG/5ML IV SOLN
500.0000 mg | Freq: Three times a day (TID) | INTRAVENOUS | Status: DC
  Administered 2017-02-22 – 2017-03-08 (×42): 500 mg via INTRAVENOUS
  Filled 2017-02-22 (×44): qty 5

## 2017-02-22 MED ORDER — ASPIRIN 300 MG RE SUPP
300.0000 mg | Freq: Every day | RECTAL | Status: DC
  Administered 2017-02-22: 300 mg via RECTAL
  Filled 2017-02-22 (×2): qty 1

## 2017-02-22 MED ORDER — FAMOTIDINE IN NACL 20-0.9 MG/50ML-% IV SOLN
20.0000 mg | INTRAVENOUS | Status: DC
  Administered 2017-02-23: 20 mg via INTRAVENOUS
  Filled 2017-02-22: qty 50

## 2017-02-22 MED ORDER — CHLORHEXIDINE GLUCONATE CLOTH 2 % EX PADS: 6.0000 | MEDICATED_PAD | Freq: Every day | CUTANEOUS | Status: DC

## 2017-02-22 MED ORDER — METRONIDAZOLE IN NACL 5-0.79 MG/ML-% IV SOLN
500.0000 mg | Freq: Three times a day (TID) | INTRAVENOUS | Status: DC
  Administered 2017-02-22 – 2017-02-23 (×3): 500 mg via INTRAVENOUS
  Filled 2017-02-22 (×4): qty 100

## 2017-02-22 MED ORDER — VANCOMYCIN HCL IN DEXTROSE 1-5 GM/200ML-% IV SOLN
1000.0000 mg | Freq: Once | INTRAVENOUS | Status: AC
  Administered 2017-02-22: 1000 mg via INTRAVENOUS
  Filled 2017-02-22: qty 200

## 2017-02-22 MED ORDER — HEPARIN SODIUM (PORCINE) 5000 UNIT/ML IJ SOLN
5000.0000 [IU] | Freq: Three times a day (TID) | INTRAMUSCULAR | Status: DC
  Administered 2017-02-22 – 2017-03-09 (×44): 5000 [IU] via SUBCUTANEOUS
  Filled 2017-02-22 (×42): qty 1

## 2017-02-22 MED ORDER — MIDAZOLAM HCL 2 MG/2ML IJ SOLN
1.0000 mg | INTRAMUSCULAR | Status: DC | PRN
  Administered 2017-02-24 (×2): 1 mg via INTRAVENOUS
  Filled 2017-02-22 (×3): qty 2

## 2017-02-22 MED ORDER — IOPAMIDOL (ISOVUE-300) INJECTION 61%
INTRAVENOUS | Status: AC
  Filled 2017-02-22: qty 50

## 2017-02-22 MED ORDER — FENTANYL CITRATE (PF) 100 MCG/2ML IJ SOLN
INTRAMUSCULAR | Status: AC
  Administered 2017-02-22: 100 ug
  Filled 2017-02-22: qty 2

## 2017-02-22 MED ORDER — VANCOMYCIN HCL IN DEXTROSE 1-5 GM/200ML-% IV SOLN: 1000.0000 mg | INTRAVENOUS | Status: DC

## 2017-02-22 MED ORDER — LIDOCAINE HCL 2 % EX GEL
1.0000 | Freq: Once | CUTANEOUS | Status: AC
Start: 2017-02-22 — End: 2017-02-22
  Administered 2017-02-22: 1
  Filled 2017-02-22: qty 11

## 2017-02-22 MED ORDER — SODIUM CHLORIDE 0.9 % IV BOLUS (SEPSIS)
1000.0000 mL | Freq: Once | INTRAVENOUS | Status: DC
  Administered 2017-02-22: 1000 mL via INTRAVENOUS

## 2017-02-22 MED ORDER — VANCOMYCIN 50 MG/ML ORAL SOLUTION
500.0000 mg | Freq: Four times a day (QID) | ORAL | Status: DC
  Filled 2017-02-22 (×4): qty 10

## 2017-02-22 MED ORDER — LIDOCAINE HCL 2 % EX GEL
1.0000 "application " | Freq: Once | CUTANEOUS | Status: AC
  Administered 2017-02-22: 1 via TOPICAL
  Filled 2017-02-22: qty 11

## 2017-02-22 MED ORDER — MIDAZOLAM HCL 2 MG/2ML IJ SOLN: 1.0000 mg | INTRAMUSCULAR | Status: DC | PRN

## 2017-02-22 MED ORDER — NOREPINEPHRINE BITARTRATE 1 MG/ML IV SOLN
0.0000 ug/min | INTRAVENOUS | Status: DC
  Administered 2017-02-22: 5 ug/min via INTRAVENOUS
  Administered 2017-02-22: 10 ug/min via INTRAVENOUS
  Administered 2017-02-23: 6 ug/min via INTRAVENOUS
  Administered 2017-02-23 (×2): 10 ug/min via INTRAVENOUS
  Filled 2017-02-22 (×5): qty 4

## 2017-02-22 MED ORDER — SODIUM CHLORIDE 0.9 % IV SOLN
250.0000 mL | INTRAVENOUS | Status: DC | PRN
  Administered 2017-03-07: 250 mL via INTRAVENOUS

## 2017-02-22 MED ORDER — LACTATED RINGERS IV BOLUS (SEPSIS)
500.0000 mL | Freq: Once | INTRAVENOUS | Status: AC
  Administered 2017-02-22: 500 mL via INTRAVENOUS

## 2017-02-22 MED ORDER — BUTAMBEN-TETRACAINE-BENZOCAINE 2-2-14 % EX AERO
1.0000 | INHALATION_SPRAY | Freq: Once | CUTANEOUS | Status: AC
  Administered 2017-02-22: 1 via TOPICAL
  Filled 2017-02-22: qty 20

## 2017-02-22 MED ORDER — SODIUM CHLORIDE 0.9 % IV SOLN
0.0000 ug/h | INTRAVENOUS | Status: DC
  Administered 2017-02-22: 25 ug/h via INTRAVENOUS
  Filled 2017-02-22: qty 50

## 2017-02-22 MED ORDER — VANCOMYCIN 50 MG/ML ORAL SOLUTION
500.0000 mg | Freq: Four times a day (QID) | ORAL | Status: DC
  Filled 2017-02-22: qty 10

## 2017-02-22 MED ORDER — CHLORHEXIDINE GLUCONATE CLOTH 2 % EX PADS
6.0000 | MEDICATED_PAD | Freq: Every day | CUTANEOUS | Status: DC
  Administered 2017-02-23 – 2017-02-27 (×2): 6 via TOPICAL

## 2017-02-22 MED ORDER — ORAL CARE MOUTH RINSE
15.0000 mL | Freq: Four times a day (QID) | OROMUCOSAL | Status: DC
Start: 2017-02-23 — End: 2017-03-14
  Administered 2017-02-22 – 2017-03-14 (×57): 15 mL via OROMUCOSAL

## 2017-02-22 NOTE — ED Notes (Signed)
Bed: VU34 Expected date:  Expected time:  Means of arrival:  Comments: EMS 83 yo male from SNF-[facility]/abdominal distention/rhonchi/fever tylenol-now 99.6

## 2017-02-22 NOTE — Progress Notes (Addendum)
PULMONARY / CRITICAL CARE MEDICINE   Name: Derek Crawford MRN: [id number] DOB: 12/16/1932    ADMISSION DATE:  02/22/2017 CONSULTATION DATE:  02/22/17  REFERRING MD:  Randal Buba  CHIEF COMPLAINT:  Hypoxia  BRIEF DESCRIPTION:  83 y.o. 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  Required intubation after arrival to ICU. Surgery consulted for ileus.  Pt was placed on Bipap and transferred to ICU. NG tube placement was not sucessful in ED. In ICU he had increasing resp distress, work of breathing requiring intubation.  SUBJECTIVE:  On BiPAP after arrival to ICU but failed and required intubation.  VITAL SIGNS: BP 118/71   Pulse 85   Temp 97.8 F (36.6 C) (Rectal)   Resp (!) 28   Ht 5\' 9"  (1.753 m)   Wt 90.7 kg (200 lb)   SpO2 100%   BMI 29.53 kg/m   HEMODYNAMICS:    VENTILATOR SETTINGS: Vent Mode: PRVC FiO2 (%):  [100 %] 100 % Set Rate:  [14 bmp] 14 bmp Vt Set:  [570 mL] 570 mL PEEP:  [5 cmH20] 5 cmH20 Plateau Pressure:  [30 cmH20] 30 cmH20  INTAKE / OUTPUT: No intake/output data recorded.   PHYSICAL EXAMINATION: Blood pressure (!) 110/57, pulse 94, temperature 97.8 F (36.6 C), temperature source Rectal, resp. rate (!) 22, height 5\' 9"  (1.753 m), weight 200 lb (90.7 kg), SpO2 100 %. Gen:     Moderate distress HEENT:  EOMI, sclera anicteric Neck:     No masses; no thyromegaly Lungs:    B/L rhonchi; increased respiratory effort CV:         Tachycardia regular; no murmurs Abd:     Marked distention, tense, mild guarding. Diminished bowel sounds Ext:    No edema; adequate peripheral perfusion Skin:      Warm and dry; no rash Neuro: Awake, confused.   LABS:  BMET  Recent Labs Lab 02/22/17 0226  NA 140  K 3.6  CL 100*  CO2 20*  BUN 60*  CREATININE 3.65*  GLUCOSE 256*    Electrolytes  Recent Labs Lab 02/22/17 0226  CALCIUM 9.3    CBC  Recent Labs Lab 02/22/17 0226  WBC  16.0*  HGB 14.6  HCT 43.1  PLT 276    Coag's  Recent Labs Lab 02/22/17 0226  INR 1.00    Sepsis Markers  Recent Labs Lab 02/22/17 0254 02/22/17 0711  LATICACIDVEN 5.35* 4.27*    ABG  Recent Labs Lab 02/22/17 0524  PHART 7.275*  PCO2ART 39.6  PO2ART 116*    Liver Enzymes  Recent Labs Lab 02/22/17 0226  AST 27  ALT 14*  ALKPHOS 72  BILITOT 0.4  ALBUMIN 4.5    Cardiac Enzymes No results for input(s): TROPONINI, PROBNP in the last 168 hours.  Glucose  Recent Labs Lab 02/22/17 0823  GLUCAP 192*    Imaging Ct Abdomen Pelvis Wo Contrast  Result Date: 02/22/2017 CLINICAL DATA:  Question of aspiration. Fever. Concern for free intra-abdominal air on chest radiograph. Initial encounter. EXAM: CT ABDOMEN AND PELVIS WITHOUT CONTRAST TECHNIQUE: Multidetector CT imaging of the abdomen and pelvis was performed following the standard protocol without IV contrast. COMPARISON:  None. FINDINGS: Lower chest: Patchy bilateral airspace opacities raise concern for pneumonia. A large hiatal hernia is noted. Scattered coronary artery calcifications are seen. Hepatobiliary: Calcifications at the gallbladder fossa may reflect stones within a decompressed gallbladder. A vague nonspecific 1.6 cm hypodensity is  noted at the right hepatic lobe. The common bile duct remains normal in caliber. Pancreas: The pancreas is within normal limits. Spleen: The spleen is diminutive and grossly unremarkable. Adrenals/Urinary Tract: The adrenal glands are unremarkable in appearance. Nonspecific perinephric stranding is noted bilaterally. Mild bilateral renal atrophy is noted. There is no evidence of hydronephrosis. No renal or ureteral stones are identified. Stomach/Bowel: There is marked distention of small and large bowel loops, concerning for diffuse ileus. The air under the diaphragm on radiograph reflects markedly distended bowel loops. No free intra-abdominal air is seen. The stomach is  relatively decompressed and otherwise unremarkable. The appendix is not visualized; there is no evidence for appendicitis. Vascular/Lymphatic: Scattered calcification is seen along the abdominal aorta and its branches. The abdominal aorta is otherwise grossly unremarkable. The inferior vena cava is grossly unremarkable. No retroperitoneal lymphadenopathy is seen. No pelvic sidewall lymphadenopathy is identified. Reproductive: The bladder is decompressed, with a Foley catheter in place. The prostate is normal in size. Other: No significant soft tissue abnormalities are seen. Musculoskeletal: No acute osseous abnormalities are identified. Vacuum phenomenon is noted at the lower lumbar spine. The visualized musculature is unremarkable in appearance. IMPRESSION: 1. No free intra-abdominal air seen. 2. Marked distention of small and large bowel loops, concerning for diffuse ileus. 3. Patchy bilateral airspace opacities raise concern for pneumonia. 4. Suspect cholelithiasis. 5. Large hiatal hernia noted. 6. Scattered coronary artery calcifications. 7. Vague nonspecific 1.6 mm hypodensity at the right hepatic lobe. 8. Mild bilateral renal atrophy noted. 9. Scattered aortic atherosclerosis. Electronically Signed   By: Garald Balding M.D.   On: 02/22/2017 04:03   Dg Chest 2 View  Result Date: 02/22/2017 CLINICAL DATA:  Acute onset of congestion and fever. Question of aspiration. Initial encounter. EXAM: CHEST  2 VIEW COMPARISON:  None. FINDINGS: The lungs are hypoexpanded. Vascular crowding and vascular congestion are noted. Increased interstitial markings may reflect pneumonia or possibly interstitial edema. There is no evidence of pleural effusion or pneumothorax. The heart is borderline normal in size. No acute osseous abnormalities are seen. The lateral view demonstrates only diffusely distended bowel loops, likely reflecting ileus. Free intra-abdominal air is a concern, given extensive air under the right  hemidiaphragm. IMPRESSION: 1. Vascular congestion noted. Increased interstitial markings may reflect pneumonia or possibly interstitial edema. Lungs hypoexpanded. 2. Diffusely distended bowel loops likely reflects ileus. Suspect free intra-abdominal air. CT of the abdomen and pelvis would be helpful for further evaluation, when and as deemed clinically appropriate. Critical Value/emergent results were called by telephone at the time of interpretation on 02/22/2017 at 2:51 am to Dr. Veatrice Kells, who verbally acknowledged these results. Electronically Signed   By: Garald Balding M.D.   On: 02/22/2017 02:52     STUDIES:  CXR 8/14 > vascular congestion.  Diffusely distended bowel loops. CT A / P 8/14 > no free air.  Diffuse ileus.  Suspected cholelithiasis. Large hiatal hernia. Mild renal atrophy.  Patchy bilateral airspace opacities.  CULTURES: Blood 8/14 >  Sputum 8/14 >   ANTIBIOTICS: Vanc 8/14 >  Zosyn 8/14 >   SIGNIFICANT EVENTS: 8/14 > admitted.  Started on BiPAP but failed and required intubation.  CCS consulted for ileus.  LINES/TUBES: ETT 8/14 >   DISCUSSION: 83 y.o. male from nursing home, admitted with dyspnea and ileus.  Required intubation after arrival to ED. Surgery consulted for ileus.  ASSESSMENT / PLAN:  PULMONARY A: Acute hypoxic respiratory failure - failed BiPAP and required intubation. Aspiration pneumonia P:  Intubated. Full vent support. Assess ABG, adjust vent accordingly. VAP prevention measures. Treat for HCAP See ID section. CXR in AM.  CARDIOVASCULAR A:  Hx HTN, HLD. P:  Monitor hemodynamics. Assess troponin. Continue preadmission ASA. Hold preadmission amlodipine, atorvastatin, furosemide, lisinopril.  RENAL A:   AKI - sepsis + ACEi. AGMA - lactate. P:   LR @ 100. Repeat lactate to ensure clearance. Holding ACEi. BMP in AM.  GASTROINTESTINAL A:   Diffuse ileus with diarrhea Possible cholelithiasis. GI  prophylaxis. Nutrition. P:   CCS consulted, appreciate the assistance. Check C diff Place OGT to LIMS. SUP: famotidine. NPO.  HEMATOLOGIC A:   VTE Prophylaxis. P:  SCD's / heparin. CBC in AM.  INFECTIOUS A:   Sepsis- from intrabd source, aspiration with HCAP P:   Abx as above (vanc / zosyn).  Add empiric flagyl. Follow C diff Follow cultures as above. PCT algorithm to limit abx exposure.  ENDOCRINE A:   Hx DM.   P:   SSI. Hold preadmission lantus.  NEUROLOGIC A:   Acute encephalopathy - due to sedation. Hx right hemiplegia secondary to CVA, ? Seizures (on depakote as outpatient). P:   Sedation:  Propofol gtt / Midazolam PRN. RASS goal: 0 to -1. Daily WUA. Continue preadmission depakote, change to IV formulation. Assess depakote levels. Hold preadmission trazodone.  Family updated: Son updated at bedside. Derek Crawford is full code for now  Interdisciplinary Family Meeting v Palliative Care Meeting:  Due by: 03/01/17.  The patient is critically ill with multiple organ system failure and requires high complexity decision making for assessment and support, frequent evaluation and titration of therapies, advanced monitoring, review of radiographic studies and interpretation of complex data.   Critical Care Time devoted to patient care services, exclusive of separately billable procedures, described in this note is 35 minutes.   Marshell Garfinkel MD [facility] Pager [phone] If no answer or after 3pm call: [phone] 02/22/2017, 10:06 AM

## 2017-02-22 NOTE — Clinical Social Work Note (Addendum)
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  No Need for family participation in patient care:  Yes (Comment)  Care giving concerns:  Pt is wheelchair bound and has been leaving @ SNF [facility]) for 3 yrs.    Social Worker assessment / plan:  Pt unable to participate in assessment. CSW spoke to pt's son via phone to complete assessment, son reported pt has been residing @ [place] for the past 3 yrs. Pt is wheelchair bound and non-verbal. Son feels the facility is neglecting pt. Son verbalized concerns about pt being constipated at the facility for over a day. CSW encouraged son to address any concerns with administration and advised more information would be needed to call APS. CSW also advised son that he can also file a APS if he has concerns about pt's care. Son reports no other issues other than pt being constipated.  Son is contemplating about finding another facility for pt, will FU with administration and contact SW back to discuss if he wants to move pt. CSW advised son that placement needs  to be established prior to pt being medically stable. Son reports he may visit some other facilities. CSW will continue to follow pt for DC planning.  Employment status:  Retired Nurse, adult, Self Pay (Medicaid Pending) PT Recommendations:  [place] / Referral to community resources:  [place]  Patient/Family's Response to care:  Pt's son very appreciative of the care pt is receiving while inpt.  Patient/Family's Understanding of and Emotional Response to Diagnosis, Current Treatment, and Prognosis:  Pt is non-verbal and wheelchair bound at baseline. Son reports that he just wants pt to be comfortable and safe at [facility] facility.  Emotional Assessment Appearance:  Appears older than stated age Attitude/Demeanor/Rapport:  Unresponsive Affect (typically observed):  Unable to Assess Orientation:  Fluctuating Orientation (Suspected and/or reported Sundowners) Alcohol / Substance use:  Not Applicable Psych involvement (Current and /or in the community):  No (Comment)  Discharge Needs  Concerns to be addressed:    Readmission within the last 30 days:  No Current discharge risk:    Barriers to Discharge:  Continued Medical Work up   [place], [place], [place] 02/22/2017, 3:06 PM

## 2017-02-22 NOTE — Consult Note (Signed)
Referring Provider: Montey Hora, PA-C Primary Care Physician:  Gayland Curry, DO Primary Gastroenterologist:  Dr. Carlean Purl  Reason for Consultation:  Colonic ileus  HPI: Derek Crawford is a 83 y.o. male wheelchair bound male who resides at a SNF with a history of DM, GERD, glaucoma, hemiplegia R side, HTN who presented to the [facility] ED with complaints of fever. Pt arrived via EMS. Records from nursing home and EMS show that pt had a fever of 101-101.9 with associated difficulty breathing and distended abdomen with aspiration seen on cxr and sbo/ileus on kub done at nursing home. Nurse states son was at bedside this morning and stated that the pt was complaining that he could not get help to use the bathroom at the facility so he was not having bowel movements (just trying to hold them all the time).  Has issues with constipation as he had been evaluated for that in 2015 and is on Amitiza. Multiple NGT placement attempts were unsuccessful. Pt was intubated and is sedated. No family at bedside at the time of my exam. Nurse stated that pt had a watery bowel movement with some consistency after intubation.   Lactic acid upon arrival was 5.35 and later 4.27.  BUN 60, creatinine 3.65, anion gap 20, glucose 256, WBC 16 with a left shift.  K+ 3.6 and sodium 140.  Magnesium and phosphorous pending.  CT abd/pelvis WO contrast showed no free intra-abdominal air, marked distention of the small and large bowel loops concerning for diffuse ileus, patchy bilateral airspace opacities raise concern for pneumonia, suspect cholelithiasis, large hiatal hernia noted.    IR and surgery discussed back and forth.  OG placed with fluoro and confirmed to be in his stomach, but IR saying that his entire stomach is in his chest.  IR says that stomach, duodenum, and jejunum appear decompressed so he thinks that his is largely colonic ileus. Pt is intubated on levophed.  Being treated for HCAP.  Colonoscopy 08/2013:  Large  sessile polyp in the ascending colon with destruction of tissue with APC ablation and tattoo applied.  Was a large 1/4 circumference tubular adenoma and was supposed to have repeat in 01/2014, which did not occur.   Past Medical History:  Diagnosis Date  . Diabetes mellitus without complication ([place])   . GERD (gastroesophageal reflux disease)   . Glaucoma   . Hemiplegia following CVA (cerebrovascular accident) ([place])    R side weakness  . Hyperlipidemia   . Hypertension   . Iron deficiency anemia   . Muscle weakness   . Urinary incontinence     Past Surgical History:  Procedure Laterality Date  . CATARACT EXTRACTION Left   . CIRCUMCISION    . COLONOSCOPY N/A 08/23/2013   Procedure: COLONOSCOPY;  Surgeon: Gatha Mayer, MD;  Location: [facility] ENDOSCOPY;  Service: Endoscopy;  Laterality: N/A;  . ESOPHAGOGASTRODUODENOSCOPY N/A 08/23/2013   Procedure: ESOPHAGOGASTRODUODENOSCOPY (EGD);  Surgeon: Gatha Mayer, MD;  Location: [facility];  Service: Endoscopy;  Laterality: N/A;  . TONSILLECTOMY      Prior to Admission medications   Medication Sig Start Date End Date Taking? Authorizing Provider  Amino Acids-Protein Hydrolys (PRO-STAT PO) Take by mouth. Take 30 ml twice daily due to low albumin   Yes [provider]  amLODipine (NORVASC) 2.5 MG tablet Take 2.5 mg by mouth daily. Hold for BP<100/60 and HR <60   Yes [provider]  aspirin 81 MG tablet Take 81 mg by mouth daily. 08/23/13  Yes  Gatha Mayer, MD  atorvastatin (LIPITOR) 10 MG tablet Take 10 mg by mouth daily. Take one tablet at bedtime for hyperlipidemia   Yes [provider]  bisacodyl (DULCOLAX) 10 MG suppository Place 10 mg rectally as needed for moderate constipation.   Yes [provider]  divalproex (DEPAKOTE) 250 MG DR tablet Take 500 mg by mouth 3 (three) times daily between meals.    Yes [provider]  fluticasone (FLONASE) 50 MCG/ACT nasal spray Place 1 spray into the nose 2  (two) times daily.    Yes [provider]  furosemide (LASIX) 40 MG tablet Take 40 mg by mouth daily.    Yes [provider]  insulin glargine (LANTUS) 100 UNIT/ML injection Inject 25 Units into the skin. Inject every morning    Yes [provider]  latanoprost (XALATAN) 0.005 % ophthalmic solution Place 1 drop into both eyes at bedtime.   Yes [provider]  lisinopril (PRINIVIL,ZESTRIL) 5 MG tablet Take 5 mg by mouth daily.   Yes [provider]  lubiprostone (AMITIZA) 24 MCG capsule Take 24 mcg by mouth 2 (two) times daily with a meal.   Yes [provider]  Multiple Vitamins-Minerals (CERTAGEN SILVER PO) Take by mouth. Take one tablet daily   Yes [provider]  tamsulosin (FLOMAX) 0.4 MG CAPS capsule Take 0.4 mg by mouth daily.   Yes [provider]  traZODone (DESYREL) 50 MG tablet Take 25 mg by mouth 2 (two) times daily.   Yes [provider]  vitamin B-12 (CYANOCOBALAMIN) 1000 MCG tablet Take 1,000 mcg by mouth daily.   Yes [provider]  Vitamin D, Ergocalciferol, (DRISDOL) 50000 units CAPS capsule Take 50,000 Units by mouth every 30 (thirty) days.   Yes [provider]    Current Facility-Administered Medications  Medication Dose Route Frequency Provider Last Rate Last Dose  . iopamidol (ISOVUE-300) 61 % injection           . 0.9 %  sodium chloride infusion  250 mL Intravenous PRN Zaaqoq, Elveria Rising, MD      . albuterol (PROVENTIL) (2.5 MG/3ML) 0.083% nebulizer solution 2.5 mg  2.5 mg Nebulization Q6H PRN Mannam, Praveen, MD      . aspirin chewable tablet 81 mg  81 mg Per Tube Daily Desai, Rahul P, PA-C      . ceFEPIme (MAXIPIME) 2 g in dextrose 5 % 50 mL IVPB  2 g Intravenous Q24H Emiliano Dyer, RPH 100 mL/hr at 02/22/17 1534 2 g at 02/22/17 1534  . [START ON 02/23/2017] famotidine (PEPCID) IVPB 20 mg premix  20 mg Intravenous Q24H Kara Mead V, MD      . fentaNYL (SUBLIMAZE)  2,500 mcg in sodium chloride 0.9 % 250 mL (10 mcg/mL) infusion  0-400 mcg/hr Intravenous Continuous Anders Simmonds, MD 2.5 mL/hr at 02/22/17 1611 25 mcg/hr at 02/22/17 1611  . fentaNYL (SUBLIMAZE) bolus via infusion 25 mcg  25 mcg Intravenous Q1H PRN Anders Simmonds, MD      . heparin injection 5,000 Units  5,000 Units Subcutaneous Q8H Judeth Porch, MD   5,000 Units at 02/22/17 1351  . insulin aspart (novoLOG) injection 2-6 Units  2-6 Units Subcutaneous Q4H Judeth Porch, MD   4 Units at 02/22/17 1351  . lactated ringers infusion   Intravenous Continuous Judeth Porch, MD 75 mL/hr at 02/22/17 1534    . metroNIDAZOLE (FLAGYL) IVPB 500 mg  500 mg Intravenous Q8H Mannam, Praveen,  MD   Stopped at 02/22/17 1205  . midazolam (VERSED) injection 1 mg  1 mg Intravenous Q15 min PRN Mannam, Praveen, MD      . midazolam (VERSED) injection 1 mg  1 mg Intravenous Q2H PRN Mannam, Praveen, MD      . norepinephrine (LEVOPHED) 4 mg in dextrose 5 % 250 mL (0.016 mg/mL) infusion  0-40 mcg/min Intravenous Titrated Anders Simmonds, MD      . propofol (DIPRIVAN) 1000 MG/100ML infusion  0-50 mcg/kg/min Intravenous Continuous Mannam, Praveen, MD 13.6 mL/hr at 02/22/17 1500 25 mcg/kg/min at 02/22/17 1500  . valproate (DEPACON) 500 mg in dextrose 5 % 50 mL IVPB  500 mg Intravenous Q8H Desai, Rahul P, PA-C   Stopped at 02/22/17 1205  . vancomycin (VANCOCIN) 50 mg/mL oral solution 500 mg  500 mg Per Tube Q6H Mannam, Praveen, MD      . Derrill Memo ON 02/24/2017] vancomycin (VANCOCIN) IVPB 1000 mg/200 mL premix  1,000 mg Intravenous Q48H Emiliano Dyer, RPH        Allergies as of 02/22/2017  . (No Known Allergies)    Family History  Problem Relation Age of Onset  . Colon cancer Neg Hx   . Throat cancer Neg Hx   . Heart disease Neg Hx   . Liver disease Neg Hx   . Diabetes Maternal Grandfather   . Kidney disease Father     Social History   Social History  . Marital status: Widowed    Spouse name: N/A  .  Number of children: N/A  . Years of education: N/A   Occupational History  . Not on file.   Social History Main Topics  . Smoking status: Former Research scientist (life sciences)  . Smokeless tobacco: Never Used  . Alcohol use No  . Drug use: No  . Sexual activity: No   Other Topics Concern  . Not on file   Social History Narrative   Resides in [place] living center. One of his sons is in charge of the [place] and that is why the patient moved here from [place],  [place].    Review of Systems: ROS unable to be obtained due to being intubated.   Physical Exam: Vital signs in last 24 hours: Temp:  [97.8 F (36.6 C)-98.4 F (36.9 C)] 98.4 F (36.9 C) (08/15 1600) Pulse Rate:  [80-113] 80 (08/15 1554) Resp:  [14-64] 18 (08/15 1554) BP: (82-153)/(42-84) 85/46 (08/15 1554) SpO2:  [92 %-100 %] 96 % (08/15 1554) FiO2 (%):  [50 %-100 %] 50 % (08/15 1554) Weight:  [200 lb (90.7 kg)] 200 lb (90.7 kg) (08/15 0419) Last BM Date: 02/22/17 General:  Intubated and sedated. Head:  Normocephalic and atraumatic. Eyes:  Sclera clear, no icterus.  Conjunctiva pink. Mouth:  OG and ET tubes noted. Lungs:  Mechanical BS noted as well as some coarse BS. Heart:  Regular rate and rhythm; no murmurs, clicks, rubs, or gallops. Abdomen:  Markedly distended.  Tympanitic. Hypoactive BS, rare tinkling BS noted. No apparent tenderness however pt is sedated.  Rectal:  Not performed. Msk:  Symmetrical without gross deformities. Pulses:  Normal pulses noted. Extremities:  Without clubbing or edema. Neurologic:  Sedated, intubated, not arousable Skin:  Intact without significant lesions or rashes. Psych:  Sedated, intubated, not arousable  Intake/Output this shift: Total I/O In: 1501.8 [I.V.:297.8; IV Piggyback:1204] Out: -   Lab Results:  Recent Labs  02/22/17 0226  WBC 16.0*  HGB 14.6  HCT 43.1  PLT 276   BMET  Recent Labs  02/22/17 0226  NA 140  K 3.6  CL 100*    CO2 20*  GLUCOSE 256*  BUN 60*  CREATININE 3.65*  CALCIUM 9.3   LFT  Recent Labs  02/22/17 0226  PROT 8.8*  ALBUMIN 4.5  AST 27  ALT 14*  ALKPHOS 72  BILITOT 0.4   PT/INR  Recent Labs  02/22/17 0226 02/22/17 1355  LABPROT 13.2 14.2  INR 1.00 1.10   Studies/Results: Ct Abdomen Pelvis Wo Contrast  Result Date: 02/22/2017 CLINICAL DATA:  Question of aspiration. Fever. Concern for free intra-abdominal air on chest radiograph. Initial encounter. EXAM: CT ABDOMEN AND PELVIS WITHOUT CONTRAST TECHNIQUE: Multidetector CT imaging of the abdomen and pelvis was performed following the standard protocol without IV contrast. COMPARISON:  None. FINDINGS: Lower chest: Patchy bilateral airspace opacities raise concern for pneumonia. A large hiatal hernia is noted. Scattered coronary artery calcifications are seen. Hepatobiliary: Calcifications at the gallbladder fossa may reflect stones within a decompressed gallbladder. A vague nonspecific 1.6 cm hypodensity is noted at the right hepatic lobe. The common bile duct remains normal in caliber. Pancreas: The pancreas is within normal limits. Spleen: The spleen is diminutive and grossly unremarkable. Adrenals/Urinary Tract: The adrenal glands are unremarkable in appearance. Nonspecific perinephric stranding is noted bilaterally. Mild bilateral renal atrophy is noted. There is no evidence of hydronephrosis. No renal or ureteral stones are identified. Stomach/Bowel: There is marked distention of small and large bowel loops, concerning for diffuse ileus. The air under the diaphragm on radiograph reflects markedly distended bowel loops. No free intra-abdominal air is seen. The stomach is relatively decompressed and otherwise unremarkable. The appendix is not visualized; there is no evidence for appendicitis. Vascular/Lymphatic: Scattered calcification is seen along the abdominal aorta and its branches. The abdominal aorta is otherwise grossly unremarkable.  The inferior vena cava is grossly unremarkable. No retroperitoneal lymphadenopathy is seen. No pelvic sidewall lymphadenopathy is identified. Reproductive: The bladder is decompressed, with a Foley catheter in place. The prostate is normal in size. Other: No significant soft tissue abnormalities are seen. Musculoskeletal: No acute osseous abnormalities are identified. Vacuum phenomenon is noted at the lower lumbar spine. The visualized musculature is unremarkable in appearance. IMPRESSION: 1. No free intra-abdominal air seen. 2. Marked distention of small and large bowel loops, concerning for diffuse ileus. 3. Patchy bilateral airspace opacities raise concern for pneumonia. 4. Suspect cholelithiasis. 5. Large hiatal hernia noted. 6. Scattered coronary artery calcifications. 7. Vague nonspecific 1.6 mm hypodensity at the right hepatic lobe. 8. Mild bilateral renal atrophy noted. 9. Scattered aortic atherosclerosis. Electronically Signed   By: Garald Balding M.D.   On: 02/22/2017 04:03   Dg Chest 2 View  Result Date: 02/22/2017 CLINICAL DATA:  Acute onset of congestion and fever. Question of aspiration. Initial encounter. EXAM: CHEST  2 VIEW COMPARISON:  None. FINDINGS: The lungs are hypoexpanded. Vascular crowding and vascular congestion are noted. Increased interstitial markings may reflect pneumonia or possibly interstitial edema. There is no evidence of pleural effusion or pneumothorax. The heart is borderline normal in size. No acute osseous abnormalities are seen. The lateral view demonstrates only diffusely distended bowel loops, likely reflecting ileus. Free intra-abdominal air is a concern, given extensive air under the right hemidiaphragm. IMPRESSION: 1. Vascular congestion noted. Increased interstitial markings may reflect pneumonia or possibly interstitial edema. Lungs hypoexpanded. 2. Diffusely distended bowel loops likely reflects ileus. Suspect free intra-abdominal air. CT of the abdomen and pelvis  would be  helpful for further evaluation, when and as deemed clinically appropriate. Critical Value/emergent results were called by telephone at the time of interpretation on 02/22/2017 at 2:51 am to Dr. Veatrice Kells, who verbally acknowledged these results. Electronically Signed   By: Garald Balding M.D.   On: 02/22/2017 02:52   Dg Abd 1 View  Result Date: 02/22/2017 CLINICAL DATA:  OG tube placement. EXAM: ABDOMEN - 1 VIEW COMPARISON:  CT abdomen pelvis from same day. FINDINGS: Enteric tube with tip in the mid to distal esophagus. Diffusely dilated loops of bowel, unchanged. IMPRESSION: Enteric tube with tip in the mid to distal esophagus. Recommend advancement. Electronically Signed   By: Titus Dubin M.D.   On: 02/22/2017 10:45   Dg Chest Port 1 View  Result Date: 02/22/2017 CLINICAL DATA:  Repositioning of the endotracheal tube with placement of the left internal jugular venous catheter. EXAM: PORTABLE CHEST 1 VIEW COMPARISON:  Earlier portable chest x-ray revealing the endotracheal tube at the carina. FINDINGS: The endotracheal tube has been withdrawn and now lies approximately 4 cm above the carina. The left internal jugular venous catheter tip projects over the distal third of the SVC. No postprocedure pneumothorax is observed. The esophagogastric tube has been withdrawn. There is persistent bibasilar interstitial density. The cardiac silhouette is mildly enlarged. There is considerable gaseous distention of bowel under the hemidiaphragms. I cannot exclude free extraluminal gas either. IMPRESSION: Appropriate repositioning of the endotracheal tube. No postprocedure complication following the placement of the left internal jugular venous catheter. Significant gaseous distention of bowel in the upper abdomen. I cannot exclude free extraluminal gas in the upper abdomen. Reportedly the patient has not undergone recent abdominal surgery. A three-way abdominal series or abdominal CT scanning would be  useful in an effort to detect any free extraluminal gas in the abdomen which might indicate a perforated viscus. The findings were called by me to the patient's nurse, Mendel Corning, R.N. at 12:55 p.m. on 22 February 2017. She will communicate the findings to patient's team. Electronically Signed   By: David  Martinique M.D.   On: 02/22/2017 12:57   Dg Chest Port 1 View  Result Date: 02/22/2017 CLINICAL DATA:  Respiratory failure, intubated patient. Status post CVA. EXAM: PORTABLE CHEST 1 VIEW COMPARISON:  Chest x-ray of February 22, 2017 FINDINGS: The lungs are reasonably well inflated. The interstitial markings remain increased but have improved. Confluent densities at both bases are present but also improved. The cardiac silhouette is mildly enlarged. The endotracheal tube tip is at the carina. The esophagogastric tube tip in proximal port lie in the midesophagus. There is marked gaseous distention of the bowel. IMPRESSION: Improved appearance of the pulmonary interstitium bilaterally suggests resolving interstitial edema or less likely pneumonia. The endotracheal tube is at the level of the carina. Withdrawal by 3 cm is recommended. The esophagogastric tube tip in proximal port lie in the midesophagus. Advancement by approximately 20 cm is recommended. There is considerable gaseous distention of bowel in the upper abdomen. Critical Value/emergent results were called by telephone at the time of interpretation on 02/22/2017 at 10:55 am to Mendel Corning, RN,, who verbally acknowledged these results. Electronically Signed   By: David  Martinique M.D.   On: 02/22/2017 10:57    IMPRESSION/PLAN:   * Suspected small bowel and colonic ileus secondary to PNA, sepsis: NGT to LIS, insert rectal tube and start IV Reglan.  Will reassess in AM and determine potential need for endoscopic colon decompression at that point. Had one mostly liquid  stool so C diff was ordered, but doubt this as it does not sound like diarrhea had been an  issue.  Keep electrolytes WNL's, ideally should keep K+ 4 or greater. Await Mg, Phos and trend electrolytes, CBC.   * Acute hypoxic respiratory failure due to aspiration PNA. Intubated, sedated. On abx and vent support. * AKI  Derek Crawford, Derek D.  02/22/2017, 4:15 PM Pager number [phone]     Attending physician's note   I have taken a history, examined the patient, reviewed the chart, reviewed the CT scan, CXR, AXR images. GI management recommendation were discussed in detail with Ms. Myrtice Lauth so I agree with the Advanced Practitioner's note, impression and recommendations.  Lucio Edward, MD [facility] [phone] Mon-Fri 8a-5p [phone] after 5p, weekends, holidays

## 2017-02-22 NOTE — Procedures (Signed)
Intubation Procedure Note Derek Crawford [id number] 03-May-1933  Procedure: Intubation Indications: Airway protection and maintenance  Procedure Details Consent: Risks of procedure as well as the alternatives and risks of each were explained to the (patient/caregiver).  Consent for procedure obtained. and Unable to obtain consent because of emergent medical necessity. Time Out: Verified patient identification, verified procedure, site/side was marked, verified correct patient position, special equipment/implants available, medications/allergies/relevent history reviewed, required imaging and test results available.  Performed  Maximum sterile technique was used including gloves and mask.  MAC size 3 Grade 1 view, 7.5 ETT secured at 25 at lip  Evaluation Hemodynamic Status: BP stable throughout; O2 sats: stable throughout Patient's Current Condition: stable Complications: No apparent complications Patient did tolerate procedure well. Chest X-ray ordered to verify placement.  CXR: pending.   Derek Crawford 02/22/2017

## 2017-02-22 NOTE — Progress Notes (Signed)
[facility] Progress Note Patient Name: Derek Crawford DOB: [birth year] MRN: [id number]   Date of Service  02/22/2017  HPI/Events of Note  Multiple issues: 1. Troponin = 0.08 and 2. CVP = 9.  Patient already on ASA per tube.  eICU Interventions  Will order: 1.Change ASA to suppository. 2. Continue to trend Troponin. 3. Bolus with 0.9 NaCl 1 liter IV over 1 hour now.     Intervention Category Major Interventions: Other:  Lysle Dingwall 02/22/2017, 4:40 PM

## 2017-02-22 NOTE — Progress Notes (Signed)
Pharmacy Antibiotic Note  Derek Crawford is a 83 y.o. male admitted on 02/22/2017 with fever.  Pharmacy has been consulted for vancomycin and cefepime dosing for possible PNA. MD dosing IV flagyl and PO/PT vancomycin for r/o C.diff.  Patient received Vancomycin 1g and Zosyn 3.375g in ED ~3am this morning.  Plan:  Vancomycin 1g IV q48h - next dose 8/17 at 2am  Check trough at steady state, goal 15-20 mcg/ml  Cefepime 2g IV q24h  Follow up renal function & cultures, de-escalate as appropriate  Height: 5\' 9"  (175.3 cm) Weight: 200 lb (90.7 kg) IBW/kg (Calculated) : 70.7  Temp (24hrs), Avg:97.8 F (36.6 C), Min:97.8 F (36.6 C), Max:97.8 F (36.6 C)   Recent Labs Lab 02/22/17 0226 02/22/17 0254 02/22/17 0711  WBC 16.0*  --   --   CREATININE 3.65*  --   --   LATICACIDVEN  --  5.35* 4.27*    Estimated Creatinine Clearance: 17.4 mL/min (A) (by C-G formula based on SCr of 3.65 mg/dL (H)).    No Known Allergies  Antimicrobials this admission:  8/15 Zosyn x 1 8/15 Vanc >> 8/15 Cefepime >> 8/15 Flagyl >> 8/15 Vanc VT >>  Dose adjustments this admission:   Microbiology results:  8/15 BCx: sent 8/15 Sputum: sent  8/15 MRSA PCR: sent  Thank you for allowing pharmacy to be a part of this patient's care.  Peggyann Juba, PharmD, BCPS Pager: [phone] 02/22/2017 10:53 AM

## 2017-02-22 NOTE — Progress Notes (Signed)
[place] Progress Note Patient Name: Derek Crawford DOB: 18-Aug-1932 MRN: [id number]   Date of Service  02/22/2017  HPI/Events of Note  C. Difficile Toxin and Antigen are both negative.   eICU Interventions  Will D/C Vancomycin PO.      Intervention Category Major Interventions: Infection - evaluation and management  Sommer,Steven Eugene 02/22/2017, 10:28 PM

## 2017-02-22 NOTE — ED Notes (Signed)
EKG given to EDP,Palumbo,MD. For review. 

## 2017-02-22 NOTE — ED Notes (Signed)
Patient transported to X-ray 

## 2017-02-22 NOTE — ED Triage Notes (Signed)
BIB EMS from [facility], staff their states pt has fever (101.2) and possible aspiration. Pt was given 650mg  of Tylenol and CXR at [facility] showed possible aspiration. Pt abd is distended and taught upon arrival. Pt given duoneb in route and current RR is 47 at 97% SPO2.

## 2017-02-22 NOTE — ED Provider Notes (Signed)
[facility] DEPT Provider Note   CSN: [id number] Arrival date & time: 02/22/17  0223     History   Chief Complaint Chief Complaint  Patient presents with  . Fever    HPI Derek Crawford is a 83 y.o. male.  The history is provided by the EMS personnel and medical records. The history is limited by the condition of the patient. No language interpreter was used.  Fever   This is a new problem. The current episode started yesterday. The problem occurs constantly. The problem has not changed since onset.The maximum temperature noted was 101 to 101.9 F. Associated symptoms comments: Difficulty breathing and distended abdomen with aspiration seen on cxr and sbo/ileus on kub done at nursing home. He has tried nothing for the symptoms. The treatment provided no relief.    Past Medical History:  Diagnosis Date  . Diabetes mellitus without complication ([place])   . GERD (gastroesophageal reflux disease)   . Glaucoma   . Hemiplegia following CVA (cerebrovascular accident) ([place])    R side weakness  . Hyperlipidemia   . Hypertension   . Iron deficiency anemia   . Muscle weakness   . Urinary incontinence     Patient Active Problem List   Diagnosis Date Noted  . Depressed mood 12/09/2013  . Allergic rhinitis 12/09/2013  . Psychosis 10/21/2013  . BPH (benign prostatic hyperplasia) 09/16/2013  . Large Tubular adenoma right colon 08/23/2013  . Unspecified constipation 08/07/2013  . Hypertension   . Hyperlipidemia   . Urinary incontinence   . Iron deficiency anemia 05/22/2013  . Dyslipidemia 05/22/2013  . Hemiplegia ([place]) 04/10/2007  . Coronary atherosclerosis 04/10/2007  . CVA 04/10/2007  . GERD 04/10/2007  . GLAUCOMA, HX OF 04/10/2007  . PERCUTANEOUS TRANSLUMINAL CORONARY ANGIOPLASTY, HX OF 04/10/2007    Past Surgical History:  Procedure Laterality Date  . CATARACT EXTRACTION Left   . CIRCUMCISION    . COLONOSCOPY N/A 08/23/2013   Procedure: COLONOSCOPY;  Surgeon: Gatha Mayer, MD;  Location: [facility] ENDOSCOPY;  Service: Endoscopy;  Laterality: N/A;  . ESOPHAGOGASTRODUODENOSCOPY N/A 08/23/2013   Procedure: ESOPHAGOGASTRODUODENOSCOPY (EGD);  Surgeon: Gatha Mayer, MD;  Location: [facility];  Service: Endoscopy;  Laterality: N/A;  . TONSILLECTOMY         Home Medications    Prior to Admission medications   Medication Sig Start Date End Date Taking? Authorizing Provider  Amino Acids-Protein Hydrolys (PRO-STAT PO) Take by mouth. Take 30 ml twice daily due to low albumin    [provider]  amLODipine (NORVASC) 2.5 MG tablet Take 2.5 mg by mouth daily. Hold for BP<100/60 and HR <60    [provider]  aspirin 81 MG tablet Take 81 mg by mouth daily. 08/23/13   Gatha Mayer, MD  atorvastatin (LIPITOR) 10 MG tablet Take 10 mg by mouth daily. Take one tablet at bedtime for hyperlipidemia    [provider]  cetirizine (ZYRTEC) 10 MG tablet Take 10 mg by mouth daily.    [provider]  divalproex (DEPAKOTE) 250 MG DR tablet Take 500 mg by mouth 2 (two) times daily.     [provider]  ferrous sulfate 325 (65 FE) MG tablet Take 325 mg by mouth. Take one tablet daily    [provider]  fluticasone (FLONASE) 50 MCG/ACT nasal spray Place 1 spray into the nose 2 (two) times daily.     [provider]  furosemide (LASIX) 40 MG tablet Take 40 mg by mouth daily.  [provider]  insulin glargine (LANTUS) 100 UNIT/ML injection Inject 35 Units into the skin. Inject every morning    [provider]  latanoprost (XALATAN) 0.005 % ophthalmic solution Place 1 drop into both eyes at bedtime.    [provider]  lisinopril (PRINIVIL,ZESTRIL) 5 MG tablet Take 5 mg by mouth daily.    [provider]  lubiprostone (AMITIZA) 24 MCG capsule Take 24 mcg by mouth 2 (two) times daily with a meal.    [provider]  Multiple Vitamins-Minerals (CERTAGEN SILVER PO) Take by  mouth. Take one tablet daily    [provider]  pantoprazole (PROTONIX) 40 MG tablet Take 40 mg by mouth daily.    [provider]  tamsulosin (FLOMAX) 0.4 MG CAPS capsule Take 0.4 mg by mouth daily.    [provider]    Family History Family History  Problem Relation Age of Onset  . Colon cancer Neg Hx   . Throat cancer Neg Hx   . Heart disease Neg Hx   . Liver disease Neg Hx   . Diabetes Maternal Grandfather   . Kidney disease Father     Social History Social History  Substance Use Topics  . Smoking status: Former Research scientist (life sciences)  . Smokeless tobacco: Never Used  . Alcohol use No     Allergies   Patient has no known allergies.   Review of Systems Review of Systems  Unable to perform ROS: Acuity of condition  Constitutional: Positive for fever.  Respiratory: Positive for shortness of breath.   Gastrointestinal: Positive for abdominal distention.     Physical Exam Updated Vital Signs BP 117/69 (BP Location: Right Arm)   Pulse (!) 113   Temp 97.8 F (36.6 C) (Rectal)   Resp (!) 46   SpO2 92%   Physical Exam  Constitutional: He appears well-developed and well-nourished. He appears distressed.  HENT:  Head: Normocephalic and atraumatic.  Mouth/Throat: No oropharyngeal exudate.  Eyes: Pupils are equal, round, and reactive to light. Conjunctivae are normal.  Neck: Normal range of motion. Neck supple. No JVD present.  Cardiovascular: Regular rhythm and intact distal pulses.  Tachycardia present.   Pulmonary/Chest: No stridor. Tachypnea noted. He has rales.  Abdominal: He exhibits distension. There is tenderness.  Musculoskeletal: He exhibits edema.  Neurological: He is alert.  Skin: Skin is warm and dry. Capillary refill takes less than 2 seconds.  Psychiatric:  unable     ED Treatments / Results   Vitals:   02/22/17 0446 02/22/17 0612  BP: 111/74 (!) 91/54  Pulse: (!) 108 100  Resp: (!) 29 (!) 32  Temp:    SpO2: 100% 100%     Labs (all labs ordered are listed, but only abnormal results are displayed)  Results for orders placed or performed during the hospital encounter of 02/22/17  Comprehensive metabolic panel  Result Value Ref Range   Sodium 140 135 - 145 mmol/L   Potassium 3.6 3.5 - 5.1 mmol/L   Chloride 100 (L) 101 - 111 mmol/L   CO2 20 (L) 22 - 32 mmol/L   Glucose, Bld 256 (H) 65 - 99 mg/dL   BUN 60 (H) 6 - 20 mg/dL   Creatinine, Ser 3.65 (H) 0.61 - 1.24 mg/dL   Calcium 9.3 8.9 - 10.3 mg/dL   Total Protein 8.8 (H) 6.5 - 8.1 g/dL   Albumin 4.5 3.5 - 5.0 g/dL   AST 27 15 - 41 U/L   ALT 14 (L) 17 -  63 U/L   Alkaline Phosphatase 72 38 - 126 U/L   Total Bilirubin 0.4 0.3 - 1.2 mg/dL   GFR calc non Af Amer 14 (L) >60 mL/min   GFR calc Af Amer 16 (L) >60 mL/min   Anion gap 20 (H) 5 - 15  CBC with Differential  Result Value Ref Range   WBC 16.0 (H) 4.0 - 10.5 K/uL   RBC 5.32 4.22 - 5.81 MIL/uL   Hemoglobin 14.6 13.0 - 17.0 g/dL   HCT 43.1 39.0 - 52.0 %   MCV 81.0 78.0 - 100.0 fL   MCH 27.4 26.0 - 34.0 pg   MCHC 33.9 30.0 - 36.0 g/dL   RDW 13.6 11.5 - 15.5 %   Platelets 276 150 - 400 K/uL   Neutrophils Relative % 85 %   Neutro Abs 13.6 (H) 1.7 - 7.7 K/uL   Lymphocytes Relative 6 %   Lymphs Abs 1.0 0.7 - 4.0 K/uL   Monocytes Relative 9 %   Monocytes Absolute 1.4 (H) 0.1 - 1.0 K/uL   Eosinophils Relative 0 %   Eosinophils Absolute 0.0 0.0 - 0.7 K/uL   Basophils Relative 0 %   Basophils Absolute 0.0 0.0 - 0.1 K/uL  Protime-INR  Result Value Ref Range   Prothrombin Time 13.2 11.4 - 15.2 seconds   INR 1.00   Blood gas, arterial ([facility] ONLY)  Result Value Ref Range   FIO2 80.00    Delivery systems BILEVEL POSITIVE AIRWAY PRESSURE    Mode BILEVEL POSITIVE AIRWAY PRESSURE    LHR 8 resp/min   Inspiratory PAP 16    Expiratory PAP 6    pH, Arterial 7.275 (L) 7.350 - 7.450   pCO2 arterial 39.6 32.0 - 48.0 mmHg   pO2, Arterial 116 (H) 83.0 - 108.0 mmHg   Bicarbonate 17.9 (L) 20.0 - 28.0  mmol/L   Acid-base deficit 8.2 (H) 0.0 - 2.0 mmol/L   O2 Saturation 97.0 %   Patient temperature 97.8    Collection site LEFT RADIAL    Drawn by [id number]    Sample type ARTERIAL DRAW    Allens test (pass/fail) PASS PASS  I-Stat CG4 Lactic Acid, ED  Result Value Ref Range   Lactic Acid, Venous 5.35 (HH) 0.5 - 1.9 mmol/L   Comment NOTIFIED PHYSICIAN    Ct Abdomen Pelvis Wo Contrast  Result Date: 02/22/2017 CLINICAL DATA:  Question of aspiration. Fever. Concern for free intra-abdominal air on chest radiograph. Initial encounter. EXAM: CT ABDOMEN AND PELVIS WITHOUT CONTRAST TECHNIQUE: Multidetector CT imaging of the abdomen and pelvis was performed following the standard protocol without IV contrast. COMPARISON:  None. FINDINGS: Lower chest: Patchy bilateral airspace opacities raise concern for pneumonia. A large hiatal hernia is noted. Scattered coronary artery calcifications are seen. Hepatobiliary: Calcifications at the gallbladder fossa may reflect stones within a decompressed gallbladder. A vague nonspecific 1.6 cm hypodensity is noted at the right hepatic lobe. The common bile duct remains normal in caliber. Pancreas: The pancreas is within normal limits. Spleen: The spleen is diminutive and grossly unremarkable. Adrenals/Urinary Tract: The adrenal glands are unremarkable in appearance. Nonspecific perinephric stranding is noted bilaterally. Mild bilateral renal atrophy is noted. There is no evidence of hydronephrosis. No renal or ureteral stones are identified. Stomach/Bowel: There is marked distention of small and large bowel loops, concerning for diffuse ileus. The air under the diaphragm on radiograph reflects markedly distended bowel loops. No free intra-abdominal air is seen. The stomach is relatively decompressed and otherwise  unremarkable. The appendix is not visualized; there is no evidence for appendicitis. Vascular/Lymphatic: Scattered calcification is seen along the abdominal aorta and its  branches. The abdominal aorta is otherwise grossly unremarkable. The inferior vena cava is grossly unremarkable. No retroperitoneal lymphadenopathy is seen. No pelvic sidewall lymphadenopathy is identified. Reproductive: The bladder is decompressed, with a Foley catheter in place. The prostate is normal in size. Other: No significant soft tissue abnormalities are seen. Musculoskeletal: No acute osseous abnormalities are identified. Vacuum phenomenon is noted at the lower lumbar spine. The visualized musculature is unremarkable in appearance. IMPRESSION: 1. No free intra-abdominal air seen. 2. Marked distention of small and large bowel loops, concerning for diffuse ileus. 3. Patchy bilateral airspace opacities raise concern for pneumonia. 4. Suspect cholelithiasis. 5. Large hiatal hernia noted. 6. Scattered coronary artery calcifications. 7. Vague nonspecific 1.6 mm hypodensity at the right hepatic lobe. 8. Mild bilateral renal atrophy noted. 9. Scattered aortic atherosclerosis. Electronically Signed   By: Garald Balding M.D.   On: 02/22/2017 04:03   Dg Chest 2 View  Result Date: 02/22/2017 CLINICAL DATA:  Acute onset of congestion and fever. Question of aspiration. Initial encounter. EXAM: CHEST  2 VIEW COMPARISON:  None. FINDINGS: The lungs are hypoexpanded. Vascular crowding and vascular congestion are noted. Increased interstitial markings may reflect pneumonia or possibly interstitial edema. There is no evidence of pleural effusion or pneumothorax. The heart is borderline normal in size. No acute osseous abnormalities are seen. The lateral view demonstrates only diffusely distended bowel loops, likely reflecting ileus. Free intra-abdominal air is a concern, given extensive air under the right hemidiaphragm. IMPRESSION: 1. Vascular congestion noted. Increased interstitial markings may reflect pneumonia or possibly interstitial edema. Lungs hypoexpanded. 2. Diffusely distended bowel loops likely reflects  ileus. Suspect free intra-abdominal air. CT of the abdomen and pelvis would be helpful for further evaluation, when and as deemed clinically appropriate. Critical Value/emergent results were called by telephone at the time of interpretation on 02/22/2017 at 2:51 am to Dr. Veatrice Kells, who verbally acknowledged these results. Electronically Signed   By: Garald Balding M.D.   On: 02/22/2017 02:52    EKG  EKG Interpretation  Date/Time:  Wednesday February 22 2017 03:04:48 EDT Ventricular Rate:  112 PR Interval:    QRS Duration: 120 QT Interval:  336 QTC Calculation: 457 R Axis:   -54 Text Interpretation:  Sinus tachycardia Nonspecific IVCD with LAD Left ventricular hypertrophy Anterolateral infarct, age indeterminate Baseline wander in lead(s) V5 Confirmed by Randal Buba, Daleah Coulson ([id number]) on 02/22/2017 3:13:47 AM       Procedures Procedures (including critical care time)  Medications Ordered in ED Medications  sodium chloride 0.9 % bolus 1,000 mL (1,000 mLs Intravenous New Bag/Given 02/22/17 0505)    And  sodium chloride 0.9 % bolus 1,000 mL (0 mLs Intravenous Stopped 02/22/17 0545)    And  sodium chloride 0.9 % bolus 1,000 mL (1,000 mLs Intravenous New Bag/Given 02/22/17 0515)  lidocaine (XYLOCAINE) 2 % jelly 1 application (not administered)  vancomycin (VANCOCIN) IVPB 1000 mg/200 mL premix (0 mg Intravenous Stopped 02/22/17 0412)  piperacillin-tazobactam (ZOSYN) IVPB 3.375 g (0 g Intravenous Stopped 02/22/17 0345)  lidocaine (XYLOCAINE) 2 % jelly 1 application (1 application Other Given 02/22/17 0524)  butamben-tetracaine-benzocaine (CETACAINE) spray 1 spray (1 spray Topical Given 02/22/17 0525)    MDM Reviewed: previous chart, nursing note and vitals Interpretation: labs, ECG, x-ray and CT scan (elevated white count and lactate by me, ileus on CT by me no free air pna  B by me on cxr) Total time providing critical care: 75-105 minutes. This excludes time spent performing separately reportable  procedures and services. Consults: critical care  CRITICAL CARE Performed by: Carlisle Beers Total critical care time: 76 minutes Critical care time was exclusive of separately billable procedures and treating other patients. Critical care was necessary to treat or prevent imminent or life-threatening deterioration. Critical care was time spent personally by me on the following activities: development of treatment plan with patient and/or surrogate as well as nursing, discussions with consultants, evaluation of patient's response to treatment, examination of patient, obtaining history from patient or surrogate, ordering and performing treatments and interventions, ordering and review of laboratory studies, ordering and review of radiographic studies, pulse oximetry and re-evaluation of patient's condition.  Unable to pass NGT   Final Clinical Impressions(s) / ED Diagnoses  Sepsis on Bipap:  Will admit to [facility], Areya Lemmerman, MD 02/22/17 [phone]

## 2017-02-22 NOTE — Progress Notes (Signed)
*  PRELIMINARY RESULTS* Echocardiogram 2D Echocardiogram has been performed.  Leavy Cella 02/22/2017, 3:44 PM

## 2017-02-22 NOTE — Progress Notes (Signed)
[place] Progress Note Patient Name: Derek Crawford DOB: 1932/12/17 MRN: [id number]   Date of Service  02/22/2017  HPI/Events of Note  Multiple issues: 1. Severe agitation - with increase Ppeak and 2. Hypotension - BP = 69/42. Patient has CVL.  eICU Interventions  Will order: 1. Monitor CVP now and Q 4 hours. 2. Norepinephrine IV infusion. Titrate to MAP >= 65. 3. D/C Propofol IV infusion. 4. Fentanyl IV infusion. Titrate to RASS = 0 to -1.     Intervention Category Major Interventions: Delirium, psychosis, severe agitation - evaluation and management  Sommer,Steven Eugene 02/22/2017, 3:48 PM

## 2017-02-22 NOTE — Progress Notes (Signed)
Pt intubated. 2 mg versed, 100 mcg fentanyl, 20 etomidate and 60 roc given before/during intubation. ETT 25 at lips. VS stable. Will continue to monitor.

## 2017-02-22 NOTE — Care Management Note (Signed)
Case Management Note  Patient Details  Name: Derek Crawford MRN: [id number] Date of Birth: March 27, 1933  Subjective/Objective:       Sepsis ans resp failure requiring Bipap             Action/Plan: Date:  February 22, 2017 Chart reviewed for concurrent status and case management needs. Will continue to follow patient progress. Discharge Planning: following for needs Expected discharge date: [id number] Velva Harman, BSN, [place], [place]  Expected Discharge Date:                  Expected Discharge Plan:  Home/Self Care  In-House Referral:     Discharge planning Services  CM Consult  Post Acute Care Choice:    Choice offered to:     DME Arranged:    DME Agency:     HH Arranged:    HH Agency:     Status of Service:  In process, will continue to follow  If discussed at Long Length of Stay Meetings, dates discussed:    Additional Comments:  Leeroy Cha, RN 02/22/2017, 8:47 AM

## 2017-02-22 NOTE — Consult Note (Signed)
[facility] Surgery Consult/Admission Note  Derek Crawford 1932/10/02  [id number].    Requesting MD: Dr. Elsworth Soho Chief Complaint/Reason for Consult: ileus  HPI:   Pt is a 83 year old wheelchair bound male who resides at a SNF with a history of DM, GERD, glaucoma, hemiplegia R side, HTN who presented to the [facility] ED with complaints of fever. Pt arrived via EMS. Records from nursing home and EMS show that pt had a fever of 101-101.9 with associated difficulty breathing and distended abdomen with aspiration seen on cxr and sbo/ileus on kub done at nursing home. Nurse states son was at bedside this morning and stated that the pt was complaining that he could not get help to use the bathroom so he was not having bowel movements. Multiple NGT placement attempts were unsuccessful. Pt was intubated and is sedated. No family at bedside at the time of my exam. Nurse stated that pt had a watery bowel movement after intubation.   Lactic acid upon arrival was 5.35 and later 4.27. Pt appears to have a non compensated metabolic acidosis with a pH of 7.296. BUN 60, creatinine 3.65, anion gap 20, glucose 256, WBC 16 with a left shift  CT abd/pel WO contrast showed no free intra-abdominal air, marked distendtion of the cmall and large bowel loops concerning for diffuse ileus, patchy bilateral airspace opacities raise concern for pneumonia, suspect cholelithiasis, large hiatal hernia noted.    ROS:  Review of Systems  Unable to perform ROS: Intubated     Family History  Problem Relation Age of Onset  . Colon cancer Neg Hx   . Throat cancer Neg Hx   . Heart disease Neg Hx   . Liver disease Neg Hx   . Diabetes Maternal Grandfather   . Kidney disease Father     Past Medical History:  Diagnosis Date  . Diabetes mellitus without complication ([place])   . GERD (gastroesophageal reflux disease)   . Glaucoma   . Hemiplegia following CVA (cerebrovascular accident) ([place])    R side weakness  . Hyperlipidemia    . Hypertension   . Iron deficiency anemia   . Muscle weakness   . Urinary incontinence     Past Surgical History:  Procedure Laterality Date  . CATARACT EXTRACTION Left   . CIRCUMCISION    . COLONOSCOPY N/A 08/23/2013   Procedure: COLONOSCOPY;  Surgeon: Gatha Mayer, MD;  Location: [facility];  Service: Endoscopy;  Laterality: N/A;  . ESOPHAGOGASTRODUODENOSCOPY N/A 08/23/2013   Procedure: ESOPHAGOGASTRODUODENOSCOPY (EGD);  Surgeon: Gatha Mayer, MD;  Location: [facility];  Service: Endoscopy;  Laterality: N/A;  . TONSILLECTOMY      Social History:  reports that he has quit smoking. He has never used smokeless tobacco. He reports that he does not drink alcohol or use drugs.  Allergies: No Known Allergies  Medications Prior to Admission  Medication Sig Dispense Refill  . Amino Acids-Protein Hydrolys (PRO-STAT PO) Take by mouth. Take 30 ml twice daily due to low albumin    . amLODipine (NORVASC) 2.5 MG tablet Take 2.5 mg by mouth daily. Hold for BP<100/60 and HR <60    . aspirin 81 MG tablet Take 81 mg by mouth daily.    Marland Kitchen atorvastatin (LIPITOR) 10 MG tablet Take 10 mg by mouth daily. Take one tablet at bedtime for hyperlipidemia    . bisacodyl (DULCOLAX) 10 MG suppository Place 10 mg rectally as needed for moderate constipation.    . divalproex (DEPAKOTE) 250 MG DR tablet  Take 500 mg by mouth 3 (three) times daily between meals.     . fluticasone (FLONASE) 50 MCG/ACT nasal spray Place 1 spray into the nose 2 (two) times daily.     . furosemide (LASIX) 40 MG tablet Take 40 mg by mouth daily.     . insulin glargine (LANTUS) 100 UNIT/ML injection Inject 25 Units into the skin. Inject every morning     . latanoprost (XALATAN) 0.005 % ophthalmic solution Place 1 drop into both eyes at bedtime.    Marland Kitchen lisinopril (PRINIVIL,ZESTRIL) 5 MG tablet Take 5 mg by mouth daily.    Marland Kitchen lubiprostone (AMITIZA) 24 MCG capsule Take 24 mcg by mouth 2 (two) times daily with a meal.    . Multiple  Vitamins-Minerals (CERTAGEN SILVER PO) Take by mouth. Take one tablet daily    . tamsulosin (FLOMAX) 0.4 MG CAPS capsule Take 0.4 mg by mouth daily.    . traZODone (DESYREL) 50 MG tablet Take 25 mg by mouth 2 (two) times daily.    . vitamin B-12 (CYANOCOBALAMIN) 1000 MCG tablet Take 1,000 mcg by mouth daily.    . Vitamin D, Ergocalciferol, (DRISDOL) 50000 units CAPS capsule Take 50,000 Units by mouth every 30 (thirty) days.      Blood pressure 126/84, pulse 80, temperature 97.8 F (36.6 C), temperature source Rectal, resp. rate 14, height _0  (1.753 m), weight 200 lb (90.7 kg), SpO2 98 %.  Physical Exam  Constitutional: He is intubated.  Intubated and sedated  HENT:  Head: Normocephalic and atraumatic.  Nose: Nose normal.  Eyes: Right eye exhibits discharge (clear watery ). Left eye exhibits no discharge. No scleral icterus.  Pupils are equal  Neck: No tracheal deviation present. No thyromegaly present.  Did not assess ROM  Cardiovascular: Normal rate, regular rhythm and normal heart sounds.   No murmur heard. Pulses:      Radial pulses are 2+ on the right side, and 2+ on the left side.  Pulmonary/Chest: He is intubated.  Course breath sounds throughout all lung fields  Abdominal: He exhibits distension. Bowel sounds are tinkling. No hernia.  Markedly distended and firm, tympanic, few tinkling BS appreciated  Musculoskeletal: He exhibits edema (1+ of BLE). He exhibits no deformity.  Neurological:  Pt does not respond to pain but is sedated  Skin: Skin is warm and dry. No rash noted.  Nursing note and vitals reviewed.   Results for orders placed or performed during the hospital encounter of 02/22/17 (from the past 48 hour(s))  Comprehensive metabolic panel     Status: Abnormal   Collection Time: 02/22/17  2:26 AM  Result Value Ref Range   Sodium 140 135 - 145 mmol/L   Potassium 3.6 3.5 - 5.1 mmol/L   Chloride 100 (L) 101 - 111 mmol/L   CO2 20 (L) 22 - 32 mmol/L   Glucose,  Bld 256 (H) 65 - 99 mg/dL   BUN 60 (H) 6 - 20 mg/dL   Creatinine, Ser 3.65 (H) 0.61 - 1.24 mg/dL   Calcium 9.3 8.9 - 10.3 mg/dL   Total Protein 8.8 (H) 6.5 - 8.1 g/dL   Albumin 4.5 3.5 - 5.0 g/dL   AST 27 15 - 41 U/L   ALT 14 (L) 17 - 63 U/L   Alkaline Phosphatase 72 38 - 126 U/L   Total Bilirubin 0.4 0.3 - 1.2 mg/dL   GFR calc non Af Amer 14 (L) >60 mL/min   GFR calc Af Amer 16 (L) >60 mL/min  Comment: (NOTE) The eGFR has been calculated using the CKD EPI equation. This calculation has not been validated in all clinical situations. eGFR's persistently <60 mL/min signify possible Chronic Kidney Disease.    Anion gap 20 (H) 5 - 15  CBC with Differential     Status: Abnormal   Collection Time: 02/22/17  2:26 AM  Result Value Ref Range   WBC 16.0 (H) 4.0 - 10.5 K/uL   RBC 5.32 4.22 - 5.81 MIL/uL   Hemoglobin 14.6 13.0 - 17.0 g/dL   HCT 43.1 39.0 - 52.0 %   MCV 81.0 78.0 - 100.0 fL   MCH 27.4 26.0 - 34.0 pg   MCHC 33.9 30.0 - 36.0 g/dL   RDW 13.6 11.5 - 15.5 %   Platelets 276 150 - 400 K/uL   Neutrophils Relative % 85 %   Neutro Abs 13.6 (H) 1.7 - 7.7 K/uL   Lymphocytes Relative 6 %   Lymphs Abs 1.0 0.7 - 4.0 K/uL   Monocytes Relative 9 %   Monocytes Absolute 1.4 (H) 0.1 - 1.0 K/uL   Eosinophils Relative 0 %   Eosinophils Absolute 0.0 0.0 - 0.7 K/uL   Basophils Relative 0 %   Basophils Absolute 0.0 0.0 - 0.1 K/uL  Protime-INR     Status: None   Collection Time: 02/22/17  2:26 AM  Result Value Ref Range   Prothrombin Time 13.2 11.4 - 15.2 seconds   INR 1.00   I-Stat CG4 Lactic Acid, ED     Status: Abnormal   Collection Time: 02/22/17  2:54 AM  Result Value Ref Range   Lactic Acid, Venous 5.35 (HH) 0.5 - 1.9 mmol/L   Comment NOTIFIED PHYSICIAN   Blood gas, arterial ([facility] ONLY)     Status: Abnormal   Collection Time: 02/22/17  5:24 AM  Result Value Ref Range   FIO2 80.00    Delivery systems BILEVEL POSITIVE AIRWAY PRESSURE    Mode BILEVEL POSITIVE AIRWAY PRESSURE     LHR 8 resp/min    Comment: BACK UP RATE   Inspiratory PAP 16    Expiratory PAP 6    pH, Arterial 7.275 (L) 7.350 - 7.450   pCO2 arterial 39.6 32.0 - 48.0 mmHg   pO2, Arterial 116 (H) 83.0 - 108.0 mmHg   Bicarbonate 17.9 (L) 20.0 - 28.0 mmol/L   Acid-base deficit 8.2 (H) 0.0 - 2.0 mmol/L   O2 Saturation 97.0 %   Patient temperature 97.8    Collection site LEFT RADIAL    Drawn by [id number]    Sample type ARTERIAL DRAW    Allens test (pass/fail) PASS PASS  I-Stat CG4 Lactic Acid, ED     Status: Abnormal   Collection Time: 02/22/17  7:11 AM  Result Value Ref Range   Lactic Acid, Venous 4.27 (HH) 0.5 - 1.9 mmol/L   Comment NOTIFIED PHYSICIAN   Urinalysis, Routine w reflex microscopic     Status: Abnormal   Collection Time: 02/22/17  8:17 AM  Result Value Ref Range   Color, Urine AMBER (A) YELLOW    Comment: BIOCHEMICALS MAY BE AFFECTED BY COLOR   APPearance CLOUDY (A) CLEAR   Specific Gravity, Urine 1.024 1.005 - 1.030   pH 5.0 5.0 - 8.0   Glucose, UA NEGATIVE NEGATIVE mg/dL   Hgb urine dipstick LARGE (A) NEGATIVE   Bilirubin Urine NEGATIVE NEGATIVE   Ketones, ur 5 (A) NEGATIVE mg/dL   Protein, ur 100 (A) NEGATIVE mg/dL   Nitrite NEGATIVE NEGATIVE  Leukocytes, UA TRACE (A) NEGATIVE   RBC / HPF 6-30 0 - 5 RBC/hpf   WBC, UA TOO NUMEROUS TO COUNT 0 - 5 WBC/hpf   Bacteria, UA RARE (A) NONE SEEN   Squamous Epithelial / LPF 0-5 (A) NONE SEEN   Hyaline Casts, UA PRESENT   CBG monitoring, ED     Status: Abnormal   Collection Time: 02/22/17  8:23 AM  Result Value Ref Range   Glucose-Capillary 192 (H) 65 - 99 mg/dL  MRSA PCR Screening     Status: None   Collection Time: 02/22/17  9:20 AM  Result Value Ref Range   MRSA by PCR NEGATIVE NEGATIVE    Comment:        The GeneXpert MRSA Assay (FDA approved for NASAL specimens only), is one component of a comprehensive MRSA colonization surveillance program. It is not intended to diagnose MRSA infection nor to guide or monitor  treatment for MRSA infections.   Blood gas, arterial     Status: Abnormal   Collection Time: 02/22/17 11:50 AM  Result Value Ref Range   FIO2 100.00    Delivery systems VENTILATOR    Mode PRESSURE REGULATED VOLUME CONTROL    VT 570 mL   LHR 14 resp/min   Peep/cpap 5.0 cm H20   pH, Arterial 7.296 (L) 7.350 - 7.450   pCO2 arterial 46.2 32.0 - 48.0 mmHg   pO2, Arterial 225 (H) 83.0 - 108.0 mmHg   Bicarbonate 21.8 20.0 - 28.0 mmol/L   Acid-base deficit 4.2 (H) 0.0 - 2.0 mmol/L   O2 Saturation 99.0 %   Patient temperature 98.6    Collection site RIGHT RADIAL    Drawn by [phone]    Sample type ARTERIAL DRAW    Allens test (pass/fail) PASS PASS  Glucose, capillary     Status: Abnormal   Collection Time: 02/22/17 12:29 PM  Result Value Ref Range   Glucose-Capillary 199 (H) 65 - 99 mg/dL   Ct Abdomen Pelvis Wo Contrast  Result Date: 02/22/2017 CLINICAL DATA:  Question of aspiration. Fever. Concern for free intra-abdominal air on chest radiograph. Initial encounter. EXAM: CT ABDOMEN AND PELVIS WITHOUT CONTRAST TECHNIQUE: Multidetector CT imaging of the abdomen and pelvis was performed following the standard protocol without IV contrast. COMPARISON:  None. FINDINGS: Lower chest: Patchy bilateral airspace opacities raise concern for pneumonia. A large hiatal hernia is noted. Scattered coronary artery calcifications are seen. Hepatobiliary: Calcifications at the gallbladder fossa may reflect stones within a decompressed gallbladder. A vague nonspecific 1.6 cm hypodensity is noted at the right hepatic lobe. The common bile duct remains normal in caliber. Pancreas: The pancreas is within normal limits. Spleen: The spleen is diminutive and grossly unremarkable. Adrenals/Urinary Tract: The adrenal glands are unremarkable in appearance. Nonspecific perinephric stranding is noted bilaterally. Mild bilateral renal atrophy is noted. There is no evidence of hydronephrosis. No renal or ureteral stones are  identified. Stomach/Bowel: There is marked distention of small and large bowel loops, concerning for diffuse ileus. The air under the diaphragm on radiograph reflects markedly distended bowel loops. No free intra-abdominal air is seen. The stomach is relatively decompressed and otherwise unremarkable. The appendix is not visualized; there is no evidence for appendicitis. Vascular/Lymphatic: Scattered calcification is seen along the abdominal aorta and its branches. The abdominal aorta is otherwise grossly unremarkable. The inferior vena cava is grossly unremarkable. No retroperitoneal lymphadenopathy is seen. No pelvic sidewall lymphadenopathy is identified. Reproductive: The bladder is decompressed, with a Foley catheter in place. The  prostate is normal in size. Other: No significant soft tissue abnormalities are seen. Musculoskeletal: No acute osseous abnormalities are identified. Vacuum phenomenon is noted at the lower lumbar spine. The visualized musculature is unremarkable in appearance. IMPRESSION: 1. No free intra-abdominal air seen. 2. Marked distention of small and large bowel loops, concerning for diffuse ileus. 3. Patchy bilateral airspace opacities raise concern for pneumonia. 4. Suspect cholelithiasis. 5. Large hiatal hernia noted. 6. Scattered coronary artery calcifications. 7. Vague nonspecific 1.6 mm hypodensity at the right hepatic lobe. 8. Mild bilateral renal atrophy noted. 9. Scattered aortic atherosclerosis. Electronically Signed   By: Garald Balding M.D.   On: 02/22/2017 04:03   Dg Chest 2 View  Result Date: 02/22/2017 CLINICAL DATA:  Acute onset of congestion and fever. Question of aspiration. Initial encounter. EXAM: CHEST  2 VIEW COMPARISON:  None. FINDINGS: The lungs are hypoexpanded. Vascular crowding and vascular congestion are noted. Increased interstitial markings may reflect pneumonia or possibly interstitial edema. There is no evidence of pleural effusion or pneumothorax. The  heart is borderline normal in size. No acute osseous abnormalities are seen. The lateral view demonstrates only diffusely distended bowel loops, likely reflecting ileus. Free intra-abdominal air is a concern, given extensive air under the right hemidiaphragm. IMPRESSION: 1. Vascular congestion noted. Increased interstitial markings may reflect pneumonia or possibly interstitial edema. Lungs hypoexpanded. 2. Diffusely distended bowel loops likely reflects ileus. Suspect free intra-abdominal air. CT of the abdomen and pelvis would be helpful for further evaluation, when and as deemed clinically appropriate. Critical Value/emergent results were called by telephone at the time of interpretation on 02/22/2017 at 2:51 am to Dr. Veatrice Kells, who verbally acknowledged these results. Electronically Signed   By: Garald Balding M.D.   On: 02/22/2017 02:52   Dg Abd 1 View  Result Date: 02/22/2017 CLINICAL DATA:  OG tube placement. EXAM: ABDOMEN - 1 VIEW COMPARISON:  CT abdomen pelvis from same day. FINDINGS: Enteric tube with tip in the mid to distal esophagus. Diffusely dilated loops of bowel, unchanged. IMPRESSION: Enteric tube with tip in the mid to distal esophagus. Recommend advancement. Electronically Signed   By: Titus Dubin M.D.   On: 02/22/2017 10:45   Dg Chest Port 1 View  Result Date: 02/22/2017 CLINICAL DATA:  Repositioning of the endotracheal tube with placement of the left internal jugular venous catheter. EXAM: PORTABLE CHEST 1 VIEW COMPARISON:  Earlier portable chest x-ray revealing the endotracheal tube at the carina. FINDINGS: The endotracheal tube has been withdrawn and now lies approximately 4 cm above the carina. The left internal jugular venous catheter tip projects over the distal third of the SVC. No postprocedure pneumothorax is observed. The esophagogastric tube has been withdrawn. There is persistent bibasilar interstitial density. The cardiac silhouette is mildly enlarged. There is  considerable gaseous distention of bowel under the hemidiaphragms. I cannot exclude free extraluminal gas either. IMPRESSION: Appropriate repositioning of the endotracheal tube. No postprocedure complication following the placement of the left internal jugular venous catheter. Significant gaseous distention of bowel in the upper abdomen. I cannot exclude free extraluminal gas in the upper abdomen. Reportedly the patient has not undergone recent abdominal surgery. A three-way abdominal series or abdominal CT scanning would be useful in an effort to detect any free extraluminal gas in the abdomen which might indicate a perforated viscus. The findings were called by me to the patient's nurse, Mendel Corning, R.N. at 12:55 p.m. on 22 February 2017. She will communicate the findings to patient's team. Electronically Signed  By: David  Martinique M.D.   On: 02/22/2017 12:57   Dg Chest Port 1 View  Result Date: 02/22/2017 CLINICAL DATA:  Respiratory failure, intubated patient. Status post CVA. EXAM: PORTABLE CHEST 1 VIEW COMPARISON:  Chest x-ray of February 22, 2017 FINDINGS: The lungs are reasonably well inflated. The interstitial markings remain increased but have improved. Confluent densities at both bases are present but also improved. The cardiac silhouette is mildly enlarged. The endotracheal tube tip is at the carina. The esophagogastric tube tip in proximal port lie in the midesophagus. There is marked gaseous distention of the bowel. IMPRESSION: Improved appearance of the pulmonary interstitium bilaterally suggests resolving interstitial edema or less likely pneumonia. The endotracheal tube is at the level of the carina. Withdrawal by 3 cm is recommended. The esophagogastric tube tip in proximal port lie in the midesophagus. Advancement by approximately 20 cm is recommended. There is considerable gaseous distention of bowel in the upper abdomen. Critical Value/emergent results were called by telephone at the time of  interpretation on 02/22/2017 at 10:55 am to Mendel Corning, RN,, who verbally acknowledged these results. Electronically Signed   By: David  Martinique M.D.   On: 02/22/2017 10:57      Assessment/Plan  Pneumonia and acidosis - per CCM  Ileus - concerns for free air seen on chest xray. Spoke with Dr. Polly Cobia, radiologist, who stated that the chest xray taken at 1225 looks similar to the chest xray done earlier in the day at 0226. Since there was no free air seen on CT, and the 2 chest xrays pre and post CT look similar, he is less concerned about free intra-abdominal air. He suggests repeat CT if the pt's clinical picture worsens.  - NGT decompression would be ideal in this situation. Multiple attempts were unsuccessful and fluro believes they may also be unsuccessful. I think with the level of his distention it is worth a try to have fluro place an NGT.  Will discuss case with MD. We will continue to follow closely.   Kalman Drape, [facility] Surgery 02/22/2017, 2:03 PM Pager: [phone] Consults: [phone] Mon-Fri 7:00 am-4:30 pm Sat-Sun 7:00 am-11:30 am

## 2017-02-22 NOTE — ED Notes (Signed)
RT called for transport at 0830.
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
## 2017-02-22 NOTE — Progress Notes (Signed)
[place] Progress Note Patient Name: Derek Crawford DOB: 12/12/1932 MRN: [id number]   Date of Service  02/22/2017  HPI/Events of Note  Nurse changing chronic Foley catheter and requests urine culture.   eICU Interventions  Will order urine culture.      Intervention Category Major Interventions: Infection - evaluation and management  Sommer,Steven Eugene 02/22/2017, 5:34 PM

## 2017-02-22 NOTE — ED Notes (Signed)
Patient transported to CT 

## 2017-02-22 NOTE — Procedures (Signed)
Central Venous Catheter Insertion Procedure Note Derek Crawford [id number] 05-26-1933  Procedure: Insertion of Central Venous Catheter Indications: Assessment of intravascular volume, Drug and/or fluid administration and Frequent blood sampling  Procedure Details Consent: Risks of procedure as well as the alternatives and risks of each were explained to the (patient/caregiver).  Consent for procedure obtained. Time Out: Verified patient identification, verified procedure, site/side was marked, verified correct patient position, special equipment/implants available, medications/allergies/relevent history reviewed, required imaging and test results available.  Performed  Maximum sterile technique was used including antiseptics, cap, gloves, gown, hand hygiene, mask and sheet. Skin prep: Chlorhexidine; local anesthetic administered A antimicrobial bonded/coated triple lumen catheter was placed in the left internal jugular vein using the Seldinger technique.  Evaluation Blood flow good Complications: No apparent complications Patient did tolerate procedure well. Chest X-ray ordered to verify placement.  CXR: pending.  Procedure performed under direct ultrasound guidance for real time vessel cannulation.      Montey Hora, [facility] Pager: [phone]  or [phone] 02/22/2017, 12:14 PM

## 2017-02-22 NOTE — ED Notes (Signed)
Approved patient for bed 1224, [facility] RN will be taking this patient. Called and spoke to Network engineer in ED and asked if the patient could be transferred up at 0830. Secretary agreed to give RN message and she is to call back if needed.

## 2017-02-23 ENCOUNTER — Inpatient Hospital Stay (HOSPITAL_COMMUNITY): Payer: Medicare Other

## 2017-02-23 LAB — BLOOD GAS, ARTERIAL
ACID-BASE DEFICIT: 2.6 mmol/L — AB (ref 0.0–2.0)
BICARBONATE: 22.1 mmol/L (ref 20.0–28.0)
DRAWN BY: 31101
FIO2: 0.5
MECHVT: 570 mL
O2 Saturation: 97.3 %
PEEP/CPAP: 5 cmH2O
PO2 ART: 98.7 mmHg (ref 83.0–108.0)
Patient temperature: 97.4
RATE: 14 resp/min
pCO2 arterial: 39.1 mmHg (ref 32.0–48.0)
pH, Arterial: 7.367 (ref 7.350–7.450)

## 2017-02-23 LAB — GLUCOSE, CAPILLARY
GLUCOSE-CAPILLARY: 101 mg/dL — AB (ref 65–99)
GLUCOSE-CAPILLARY: 120 mg/dL — AB (ref 65–99)
GLUCOSE-CAPILLARY: 121 mg/dL — AB (ref 65–99)
Glucose-Capillary: 128 mg/dL — ABNORMAL HIGH (ref 65–99)
Glucose-Capillary: 117 mg/dL — ABNORMAL HIGH (ref 65–99)
Glucose-Capillary: 141 mg/dL — ABNORMAL HIGH (ref 65–99)

## 2017-02-23 LAB — BASIC METABOLIC PANEL
ANION GAP: 9 (ref 5–15)
Anion gap: 6 (ref 5–15)
BUN: 46 mg/dL — ABNORMAL HIGH (ref 6–20)
BUN: 37 mg/dL — AB (ref 6–20)
CALCIUM: 7.8 mg/dL — AB (ref 8.9–10.3)
CHLORIDE: 110 mmol/L (ref 101–111)
CO2: 24 mmol/L (ref 22–32)
CO2: 24 mmol/L (ref 22–32)
CREATININE: 2.07 mg/dL — AB (ref 0.61–1.24)
Calcium: 7.7 mg/dL — ABNORMAL LOW (ref 8.9–10.3)
Chloride: 108 mmol/L (ref 101–111)
Creatinine, Ser: 1.45 mg/dL — ABNORMAL HIGH (ref 0.61–1.24)
GFR calc Af Amer: 50 mL/min — ABNORMAL LOW (ref >60)
GFR calc non Af Amer: 43 mL/min — ABNORMAL LOW (ref >60)
GFR, EST AFRICAN AMERICAN: 33 mL/min — AB (ref >60)
GFR, EST NON AFRICAN AMERICAN: 28 mL/min — AB (ref >60)
GLUCOSE: 141 mg/dL — AB (ref 65–99)
Glucose, Bld: 117 mg/dL — ABNORMAL HIGH (ref 65–99)
POTASSIUM: 3.4 mmol/L — AB (ref 3.5–5.1)
Potassium: 2.7 mmol/L — CL (ref 3.5–5.1)
SODIUM: 141 mmol/L (ref 135–145)
Sodium: 140 mmol/L (ref 135–145)

## 2017-02-23 LAB — PHOSPHORUS: PHOSPHORUS: 3.3 mg/dL (ref 2.5–4.6)

## 2017-02-23 LAB — CBC
HCT: 35.9 % — ABNORMAL LOW (ref 39.0–52.0)
HEMOGLOBIN: 11.9 g/dL — AB (ref 13.0–17.0)
MCH: 26.7 pg (ref 26.0–34.0)
MCHC: 33.1 g/dL (ref 30.0–36.0)
MCV: 80.5 fL (ref 78.0–100.0)
PLATELETS: 175 10*3/uL (ref 150–400)
RBC: 4.46 MIL/uL (ref 4.22–5.81)
RDW: 13.8 % (ref 11.5–15.5)
WBC: 15.4 10*3/uL — ABNORMAL HIGH (ref 4.0–10.5)

## 2017-02-23 LAB — MAGNESIUM: MAGNESIUM: 3.5 mg/dL — AB (ref 1.7–2.4)

## 2017-02-23 LAB — TROPONIN I
TROPONIN I: 0.05 ng/mL — AB (ref <0.03)
TROPONIN I: 0.04 ng/mL — AB (ref <0.03)

## 2017-02-23 LAB — PROCALCITONIN: PROCALCITONIN: 1.14 ng/mL

## 2017-02-23 MED ORDER — SODIUM CHLORIDE 0.9% FLUSH
10.0000 mL | Freq: Two times a day (BID) | INTRAVENOUS | Status: DC
  Administered 2017-02-23: 20 mL
  Administered 2017-02-23 – 2017-02-24 (×2): 10 mL
  Administered 2017-02-24: 20 mL
  Administered 2017-02-25 (×2): 10 mL
  Administered 2017-02-26: 20 mL
  Administered 2017-02-26 – 2017-02-28 (×4): 10 mL
  Administered 2017-03-03: 30 mL
  Administered 2017-03-03 – 2017-03-08 (×5): 10 mL

## 2017-02-23 MED ORDER — SODIUM CHLORIDE 0.9% FLUSH
10.0000 mL | INTRAVENOUS | Status: DC | PRN
  Administered 2017-03-03: 10 mL
  Administered 2017-03-03: 30 mL
  Administered 2017-03-07 – 2017-03-09 (×3): 10 mL
  Administered 2017-03-10: 20 mL
  Filled 2017-02-23 (×6): qty 40

## 2017-02-23 MED ORDER — POTASSIUM CHLORIDE 10 MEQ/100ML IV SOLN
10.0000 meq | INTRAVENOUS | Status: AC
  Administered 2017-02-23 (×6): 10 meq via INTRAVENOUS
  Filled 2017-02-23 (×6): qty 100

## 2017-02-23 MED ORDER — FENTANYL CITRATE (PF) 100 MCG/2ML IJ SOLN
25.0000 ug | INTRAMUSCULAR | Status: DC | PRN
  Administered 2017-02-23 (×2): 50 ug via INTRAVENOUS
  Administered 2017-02-23 – 2017-02-24 (×2): 100 ug via INTRAVENOUS
  Administered 2017-02-24: 50 ug via INTRAVENOUS
  Administered 2017-02-24: 100 ug via INTRAVENOUS
  Administered 2017-02-24: 50 ug via INTRAVENOUS
  Administered 2017-02-24 – 2017-02-25 (×5): 100 ug via INTRAVENOUS
  Administered 2017-02-25: 50 ug via INTRAVENOUS
  Administered 2017-02-25 – 2017-02-27 (×5): 100 ug via INTRAVENOUS
  Filled 2017-02-23 (×18): qty 2

## 2017-02-23 MED ORDER — CHLORHEXIDINE GLUCONATE CLOTH 2 % EX PADS
6.0000 | MEDICATED_PAD | Freq: Every day | CUTANEOUS | Status: DC
  Administered 2017-02-24 – 2017-03-10 (×11): 6 via TOPICAL

## 2017-02-23 MED ORDER — METOCLOPRAMIDE HCL 5 MG/ML IJ SOLN
10.0000 mg | Freq: Four times a day (QID) | INTRAMUSCULAR | Status: DC
  Administered 2017-02-23 – 2017-03-01 (×24): 10 mg via INTRAVENOUS
  Filled 2017-02-23 (×24): qty 2

## 2017-02-23 NOTE — Progress Notes (Addendum)
Initial Nutrition Assessment  DOCUMENTATION CODES:   Not applicable  INTERVENTION:   NPO for now- Recommend TPN if unable to initiate enteral feeds within 5 days.   NUTRITION DIAGNOSIS:   Inadequate oral intake related to inability to eat (pt sedated on vent and with ileus) as evidenced by NPO status.  GOAL:   Provide needs based on ASPEN/SCCM guidelines  MONITOR:   Diet advancement, Vent status, Labs, Weight trends, I & O's  REASON FOR ASSESSMENT:   Ventilator    ASSESSMENT:   83 y.o. 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  Required intubation after arrival to ICU. Surgery consulted for ileus.   Pt with hemiplegia s/p CVA and wheelchair bound at baseline  Pt sedated on vent. No family at bedside. Pt with large hiatal hernia with majority of the stomach in his chest per IR. Per GI note, "Suspected small bowel and colonic ileus secondary to PNA, sepsis: NGT to LIS, rectal tube inserted." No recent weight history documented but pt appears wt stable compared to 2017 weights. Pt appears well nourished. If unable to initiate enteral feeds within 5 days would recommend TPN. Pt with hypokalemia; monitor and supplement as needed per MD discretion.    Medications reviewed and include: aspirin, heparin, insulin, reglan, cefepime, pepcid, levophed, propofol, fentanyl, LRS @75ml /hr  Labs reviewed: K 2.7(L), BUN 46(H), creat 2.07(H), Ca 7.8(L), P 3.3 WNL, Mg 3.5(H) Wbc- 15.4(H) cbgs- 256, 117 x 24 hrs  Nutrition-Focused physical exam completed. Findings are no fat depletion, no muscle depletion, and no edema.   Patient is currently intubated on ventilator support MV: 7.9 L/min Temp (24hrs), Avg:97.6 F (36.4 C), Min:97.4 F (36.3 C), Max:98.4 F (36.9 C)  Propofol: 3.0 ml/hr- provides 79kcal/day   MAP- 63-78 mmHg  Diet Order:  Diet NPO time specified  Skin:  Reviewed, no issues  Last BM:   8/15  Height:   Ht Readings from Last 1 Encounters:  02/22/17 5\' 9"  (1.753 m)    Weight:   Wt Readings from Last 1 Encounters:  02/23/17 195 lb 1.7 oz (88.5 kg)    Ideal Body Weight:  72.7 kg  BMI:  Body mass index is 28.81 kg/m.  Estimated Nutritional Needs:   Kcal:  1714kcal/day   Protein:  98-115g/day   Fluid:  >1.7L/day   EDUCATION NEEDS:   Education needs no appropriate at this time  Koleen Distance MS, RD, [place] Pager #[phone] After Hours Pager: [phone]

## 2017-02-23 NOTE — Progress Notes (Signed)
CRITICAL VALUE ALERT  Critical Value:  K 2.7  Date & Time Notied:  02/23/17 0418  Provider Notified: Dr. Mortimer Fries  Orders Received/Actions taken: IV potassium ordered.

## 2017-02-23 NOTE — Progress Notes (Signed)
[facility]/Trauma Progress Note      Assessment/Plan Pneumonia and acidosis - per CCM  Ileus - Nurse states pt has passed gas and is having a small amount of stool - less distended - GI involved and appreciate their recs - Fleuro was able to get NGT into hiatal hernia, very little output (56mL since tube placed)  FEN: NPO, NGT in hiatal hernia VTE: SCD's, heparin ID: c diff neg, Zosyn 08/15; Vanc 08/15>>; Flagyl 08/15>>; Cefepime 08/15>> Follow up: TBD  DISPO: Patient has improved abdominal distention over yesterday. We still feel this picture is most consistent with ileus 2/2 pneumonia and sepsis rather than an abdominal source. No plans for surgery at this time.GI involved and considering possible endoscopic colon decompression. We will continue to follow.    LOS: 1 day    Subjective:  CC: fever, SOB  Pt was being weaned from sedation at the time of my exam. He opened his eyes to speech and shook his head no when asked if he had abdominal pain. Nurse states large amount of flatus when they turned the pt yesterday. Pt still intubated.   Objective: Vital signs in last 24 hours: Temp:  [97.4 F (36.3 C)-98.4 F (36.9 C)] 97.4 F (36.3 C) (08/16 0300) Pulse Rate:  [58-106] 61 (08/16 0700) Resp:  [13-28] 18 (08/16 0700) BP: (82-153)/(41-84) 141/59 (08/16 0738) SpO2:  [96 %-100 %] 100 % (08/16 0738) FiO2 (%):  [50 %-100 %] 50 % (08/16 0738) Weight:  [186 lb 1.1 oz (84.4 kg)-195 lb 1.7 oz (88.5 kg)] 195 lb 1.7 oz (88.5 kg) (08/16 0400) Last BM Date: 02/22/17  Intake/Output from previous day: 08/15 0701 - 08/16 0700 In: 5133.5 [I.V.:2469.5; IV Piggyback:2664] Out: 1100 [Urine:1050; Emesis/NG output:50] Intake/Output this shift: No intake/output data recorded.  PE: Gen: intubated, will open eyes to verbal stimulus Card:  RRR, no M/G/R heard Pulm:  Intubated and on vent, course breath sounds heard throughout Abd: less distended and softer than yesterday,  tinkling BS heard throughout, pt did not react when I palpated his abdomen Skin: no rashes noted, warm and dry Musc: improved BLE edema from yesterday   Anti-infectives: Anti-infectives    Start     Dose/Rate Route Frequency Ordered Stop   02/24/17 0200  vancomycin (VANCOCIN) IVPB 1000 mg/200 mL premix     1,000 mg 200 mL/hr over 60 Minutes Intravenous Every 48 hours 02/22/17 1051     02/22/17 1200  vancomycin (VANCOCIN) 50 mg/mL oral solution 500 mg  Status:  Discontinued     500 mg Oral Every 6 hours 02/22/17 1018 02/22/17 1019   02/22/17 1200  vancomycin (VANCOCIN) 50 mg/mL oral solution 500 mg  Status:  Discontinued     500 mg Per Tube Every 6 hours 02/22/17 1019 02/22/17 2227   02/22/17 1200  ceFEPIme (MAXIPIME) 2 g in dextrose 5 % 50 mL IVPB     2 g 100 mL/hr over 30 Minutes Intravenous Every 24 hours 02/22/17 1052     02/22/17 1030  metroNIDAZOLE (FLAGYL) IVPB 500 mg     500 mg 100 mL/hr over 60 Minutes Intravenous Every 8 hours 02/22/17 1014 03/08/17 1029   02/22/17 0300  vancomycin (VANCOCIN) IVPB 1000 mg/200 mL premix     1,000 mg 200 mL/hr over 60 Minutes Intravenous  Once 02/22/17 0257 02/22/17 0412   02/22/17 0300  piperacillin-tazobactam (ZOSYN) IVPB 3.375 g     3.375 g 100 mL/hr over 30 Minutes Intravenous  Once 02/22/17 0257 02/22/17 0345  Lab Results:   Recent Labs  02/22/17 0226 02/23/17 0314  WBC 16.0* 15.4*  HGB 14.6 11.9*  HCT 43.1 35.9*  PLT 276 175   BMET  Recent Labs  02/22/17 0226 02/23/17 0314  NA 140 141  K 3.6 2.7*  CL 100* 108  CO2 20* 24  GLUCOSE 256* 117*  BUN 60* 46*  CREATININE 3.65* 2.07*  CALCIUM 9.3 7.8*   PT/INR  Recent Labs  02/22/17 0226 02/22/17 1355  LABPROT 13.2 14.2  INR 1.00 1.10   CMP     Component Value Date/Time   NA 141 02/23/2017 0314   K 2.7 (LL) 02/23/2017 0314   CL 108 02/23/2017 0314   CO2 24 02/23/2017 0314   GLUCOSE 117 (H) 02/23/2017 0314   BUN 46 (H) 02/23/2017 0314   CREATININE 2.07  (H) 02/23/2017 0314   CALCIUM 7.8 (L) 02/23/2017 0314   PROT 8.8 (H) 02/22/2017 0226   ALBUMIN 4.5 02/22/2017 0226   AST 27 02/22/2017 0226   ALT 14 (L) 02/22/2017 0226   ALKPHOS 72 02/22/2017 0226   BILITOT 0.4 02/22/2017 0226   GFRNONAA 28 (L) 02/23/2017 0314   GFRAA 33 (L) 02/23/2017 0314   Lipase  No results found for: LIPASE  Studies/Results: Ct Abdomen Pelvis Wo Contrast  Result Date: 02/22/2017 CLINICAL DATA:  Question of aspiration. Fever. Concern for free intra-abdominal air on chest radiograph. Initial encounter. EXAM: CT ABDOMEN AND PELVIS WITHOUT CONTRAST TECHNIQUE: Multidetector CT imaging of the abdomen and pelvis was performed following the standard protocol without IV contrast. COMPARISON:  None. FINDINGS: Lower chest: Patchy bilateral airspace opacities raise concern for pneumonia. A large hiatal hernia is noted. Scattered coronary artery calcifications are seen. Hepatobiliary: Calcifications at the gallbladder fossa may reflect stones within a decompressed gallbladder. A vague nonspecific 1.6 cm hypodensity is noted at the right hepatic lobe. The common bile duct remains normal in caliber. Pancreas: The pancreas is within normal limits. Spleen: The spleen is diminutive and grossly unremarkable. Adrenals/Urinary Tract: The adrenal glands are unremarkable in appearance. Nonspecific perinephric stranding is noted bilaterally. Mild bilateral renal atrophy is noted. There is no evidence of hydronephrosis. No renal or ureteral stones are identified. Stomach/Bowel: There is marked distention of small and large bowel loops, concerning for diffuse ileus. The air under the diaphragm on radiograph reflects markedly distended bowel loops. No free intra-abdominal air is seen. The stomach is relatively decompressed and otherwise unremarkable. The appendix is not visualized; there is no evidence for appendicitis. Vascular/Lymphatic: Scattered calcification is seen along the abdominal aorta and  its branches. The abdominal aorta is otherwise grossly unremarkable. The inferior vena cava is grossly unremarkable. No retroperitoneal lymphadenopathy is seen. No pelvic sidewall lymphadenopathy is identified. Reproductive: The bladder is decompressed, with a Foley catheter in place. The prostate is normal in size. Other: No significant soft tissue abnormalities are seen. Musculoskeletal: No acute osseous abnormalities are identified. Vacuum phenomenon is noted at the lower lumbar spine. The visualized musculature is unremarkable in appearance. IMPRESSION: 1. No free intra-abdominal air seen. 2. Marked distention of small and large bowel loops, concerning for diffuse ileus. 3. Patchy bilateral airspace opacities raise concern for pneumonia. 4. Suspect cholelithiasis. 5. Large hiatal hernia noted. 6. Scattered coronary artery calcifications. 7. Vague nonspecific 1.6 mm hypodensity at the right hepatic lobe. 8. Mild bilateral renal atrophy noted. 9. Scattered aortic atherosclerosis. Electronically Signed   By: Garald Balding M.D.   On: 02/22/2017 04:03   Dg Chest 2 View  Result Date:  02/22/2017 CLINICAL DATA:  Acute onset of congestion and fever. Question of aspiration. Initial encounter. EXAM: CHEST  2 VIEW COMPARISON:  None. FINDINGS: The lungs are hypoexpanded. Vascular crowding and vascular congestion are noted. Increased interstitial markings may reflect pneumonia or possibly interstitial edema. There is no evidence of pleural effusion or pneumothorax. The heart is borderline normal in size. No acute osseous abnormalities are seen. The lateral view demonstrates only diffusely distended bowel loops, likely reflecting ileus. Free intra-abdominal air is a concern, given extensive air under the right hemidiaphragm. IMPRESSION: 1. Vascular congestion noted. Increased interstitial markings may reflect pneumonia or possibly interstitial edema. Lungs hypoexpanded. 2. Diffusely distended bowel loops likely reflects  ileus. Suspect free intra-abdominal air. CT of the abdomen and pelvis would be helpful for further evaluation, when and as deemed clinically appropriate. Critical Value/emergent results were called by telephone at the time of interpretation on 02/22/2017 at 2:51 am to Dr. Veatrice Kells, who verbally acknowledged these results. Electronically Signed   By: Garald Balding M.D.   On: 02/22/2017 02:52   Dg Abd 1 View  Result Date: 02/22/2017 CLINICAL DATA:  OG tube placement. EXAM: ABDOMEN - 1 VIEW COMPARISON:  CT abdomen pelvis from same day. FINDINGS: Enteric tube with tip in the mid to distal esophagus. Diffusely dilated loops of bowel, unchanged. IMPRESSION: Enteric tube with tip in the mid to distal esophagus. Recommend advancement. Electronically Signed   By: Titus Dubin M.D.   On: 02/22/2017 10:45   Dg Chest Port 1 View  Result Date: 02/23/2017 CLINICAL DATA:  Respiratory failure EXAM: PORTABLE CHEST 1 VIEW COMPARISON:  02/22/2017 FINDINGS: Cardiac shadow is stable. Endotracheal tube, nasogastric catheter and left jugular central line are noted. The nasogastric catheter lies in the distal esophagus and could be advanced further into the stomach. Bibasilar atelectatic changes are noted. IMPRESSION: Nasogastric catheter in the distal esophagus. This should be advanced. Stable bibasilar atelectasis. Electronically Signed   By: Inez Catalina M.D.   On: 02/23/2017 06:58   Dg Chest Port 1 View  Result Date: 02/22/2017 CLINICAL DATA:  Repositioning of the endotracheal tube with placement of the left internal jugular venous catheter. EXAM: PORTABLE CHEST 1 VIEW COMPARISON:  Earlier portable chest x-ray revealing the endotracheal tube at the carina. FINDINGS: The endotracheal tube has been withdrawn and now lies approximately 4 cm above the carina. The left internal jugular venous catheter tip projects over the distal third of the SVC. No postprocedure pneumothorax is observed. The esophagogastric tube has  been withdrawn. There is persistent bibasilar interstitial density. The cardiac silhouette is mildly enlarged. There is considerable gaseous distention of bowel under the hemidiaphragms. I cannot exclude free extraluminal gas either. IMPRESSION: Appropriate repositioning of the endotracheal tube. No postprocedure complication following the placement of the left internal jugular venous catheter. Significant gaseous distention of bowel in the upper abdomen. I cannot exclude free extraluminal gas in the upper abdomen. Reportedly the patient has not undergone recent abdominal surgery. A three-way abdominal series or abdominal CT scanning would be useful in an effort to detect any free extraluminal gas in the abdomen which might indicate a perforated viscus. The findings were called by me to the patient's nurse, Mendel Corning, R.N. at 12:55 p.m. on 22 February 2017. She will communicate the findings to patient's team. Electronically Signed   By: David  Martinique M.D.   On: 02/22/2017 12:57   Dg Chest Port 1 View  Result Date: 02/22/2017 CLINICAL DATA:  Respiratory failure, intubated patient. Status post CVA.  EXAM: PORTABLE CHEST 1 VIEW COMPARISON:  Chest x-ray of February 22, 2017 FINDINGS: The lungs are reasonably well inflated. The interstitial markings remain increased but have improved. Confluent densities at both bases are present but also improved. The cardiac silhouette is mildly enlarged. The endotracheal tube tip is at the carina. The esophagogastric tube tip in proximal port lie in the midesophagus. There is marked gaseous distention of the bowel. IMPRESSION: Improved appearance of the pulmonary interstitium bilaterally suggests resolving interstitial edema or less likely pneumonia. The endotracheal tube is at the level of the carina. Withdrawal by 3 cm is recommended. The esophagogastric tube tip in proximal port lie in the midesophagus. Advancement by approximately 20 cm is recommended. There is considerable  gaseous distention of bowel in the upper abdomen. Critical Value/emergent results were called by telephone at the time of interpretation on 02/22/2017 at 10:55 am to Mendel Corning, RN,, who verbally acknowledged these results. Electronically Signed   By: David  Martinique M.D.   On: 02/22/2017 10:57   Dg Addison Bailey G Tube Plc W/fl W/rad  Result Date: 02/22/2017 CLINICAL DATA:  Bowel distension. EXAM: NASO G TUBE PLACEMENT WITH FL AND WITH RAD CONTRAST:  10 cc Omnipaque 300 FLUOROSCOPY TIME:  Fluoroscopy Time: Radiation Exposure Index (if provided by the fluoroscopic device): Number of Acquired Spot Images: 0 COMPARISON:  CT scan and abdominal x-ray from earlier today. FINDINGS: Bedside fluoro evaluation with injection of a small volume water-soluble contrast confirms that is the NG tube is in the large hiatal hernia. Review of the CT scan from earlier today confirms a nearly the entire stomach is in the thorax with only a portion of the gastric antrum below the hemidiaphragm. NG tube could not be manipulated more distally and in fact doing so would likely be counterproductive as the antrum is so collapsed as it passes through the hiatus. IMPRESSION: NG tube tip is positioned in the large hiatal hernia. About 80-90% of the stomach is in the thorax. Electronically Signed   By: Misty Stanley M.D.   On: 02/22/2017 17:05      Kalman Drape , [facility] 02/23/2017, 8:10 AM Pager: [phone] Consults: [phone] Mon-Fri 7:00 am-4:30 pm Sat-Sun 7:00 am-11:30 am
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
## 2017-02-23 NOTE — Progress Notes (Addendum)
PULMONARY / CRITICAL CARE MEDICINE   Name: Derek Crawford MRN: [id number] DOB: [birth year]    ADMISSION DATE:  02/22/2017 CONSULTATION DATE:  02/22/17  REFERRING MD:  Randal Buba  CHIEF COMPLAINT:  Hypoxia  BRIEF DESCRIPTION:  83 y.o. 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  Required intubation after arrival to ICU. Surgery consulted for ileus.  Pt was placed on Bipap and transferred to ICU. NG tube placement was not sucessful in ED. In ICU he had increasing resp distress, work of breathing requiring intubation.   STUDIES:  CT A / P 8/14 > no free air.  Diffuse ileus.  Suspected cholelithiasis. Large hiatal hernia. Mild renal atrophy.  Patchy bilateral airspace opacities.  CULTURES: Blood 8/14 >  Sputum 8/14 >   ANTIBIOTICS: Vanc 8/14 > 8/16 Zosyn 8/14 > 8/15 Cefepime 8/15 >>  SIGNIFICANT EVENTS: 8/14 > admitted.  Started on BiPAP but failed and required intubation.  CCS consulted for ileus. 8/15 NG placed by IR  LINES/TUBES: ETT 8/14 >  [facility]J 8/15 >>   SUBJECTIVE: critically ill, intubated,s edated Awake on propofol gtt Denies pain afebrile  VITAL SIGNS: BP (!) 141/59   Pulse 61   Temp (!) 97.4 F (36.3 C) (Axillary)   Resp 18   Ht 5\' 9"  (1.753 m)   Wt 195 lb 1.7 oz (88.5 kg)   SpO2 100%   BMI 28.81 kg/m   HEMODYNAMICS: CVP:  [5 mmHg-17 mmHg] 14 mmHg  VENTILATOR SETTINGS: Vent Mode: PRVC FiO2 (%):  [50 %-100 %] 50 % Set Rate:  [14 bmp] 14 bmp Vt Set:  [570 mL] 570 mL PEEP:  [5 cmH20] 5 cmH20 Plateau Pressure:  [23 cmH20-30 cmH20] 27 cmH20  INTAKE / OUTPUT: I/O last 3 completed shifts: In: 5133.5 [I.V.:2469.5; IV Piggyback:2664] Out: 1100 [Urine:1050; Emesis/NG output:50]   PHYSICAL EXAMINATION:  Gen:     Elderly man, no distress HEENT:  EOMI, sclera anicteric , oral ETT Neck:     No masses; no thyromegaly Lungs:    B/L rhonchi; increased respiratory effort CV:         Regular RR; no murmurs Abd:     Marked distention, no guarding. Diminished bowel sounds Ext:    No edema; adequate peripheral perfusion Skin:      Warm and dry; no rash Neuro: Awake,follows 1 step commands  LABS:  BMET  Recent Labs Lab 02/22/17 0226 02/23/17 0314  NA 140 141  K 3.6 2.7*  CL 100* 108  CO2 20* 24  BUN 60* 46*  CREATININE 3.65* 2.07*  GLUCOSE 256* 117*    Electrolytes  Recent Labs Lab 02/22/17 0226 02/23/17 0314  CALCIUM 9.3 7.8*  MG  --  3.5*  PHOS  --  3.3    CBC  Recent Labs Lab 02/22/17 0226 02/23/17 0314  WBC 16.0* 15.4*  HGB 14.6 11.9*  HCT 43.1 35.9*  PLT 276 175    Coag's  Recent Labs Lab 02/22/17 0226 02/22/17 1355  INR 1.00 1.10    Sepsis Markers  Recent Labs Lab 02/22/17 0254 02/22/17 0711 02/22/17 1355 02/23/17 0314  LATICACIDVEN 5.35* 4.27* 2.5*  --   PROCALCITON  --   --  1.54 1.14    ABG  Recent Labs Lab 02/22/17 0524 02/22/17 1150 02/23/17 0350  PHART 7.275* 7.296* 7.367  PCO2ART 39.6 46.2 39.1  PO2ART 116* 225* 98.7    Liver Enzymes  Recent Labs Lab 02/22/17  0226  AST 27  ALT 14*  ALKPHOS 72  BILITOT 0.4  ALBUMIN 4.5    Cardiac Enzymes  Recent Labs Lab 02/22/17 1355 02/22/17 2145 02/23/17 0314  TROPONINI 0.08* 0.06* 0.05*    Glucose  Recent Labs Lab 02/22/17 1229 02/22/17 1558 02/22/17 1933 02/22/17 2319 02/23/17 0306 02/23/17 0754  GLUCAP 199* 175* 139* 123* 101* 128*    Imaging Dg Abd 1 View  Result Date: 02/22/2017 CLINICAL DATA:  OG tube placement. EXAM: ABDOMEN - 1 VIEW COMPARISON:  CT abdomen pelvis from same day. FINDINGS: Enteric tube with tip in the mid to distal esophagus. Diffusely dilated loops of bowel, unchanged. IMPRESSION: Enteric tube with tip in the mid to distal esophagus. Recommend advancement. Electronically Signed   By: Titus Dubin M.D.   On: 02/22/2017 10:45   Dg Chest Port 1 View  Result Date: 02/23/2017 CLINICAL DATA:  Respiratory  failure EXAM: PORTABLE CHEST 1 VIEW COMPARISON:  02/22/2017 FINDINGS: Cardiac shadow is stable. Endotracheal tube, nasogastric catheter and left jugular central line are noted. The nasogastric catheter lies in the distal esophagus and could be advanced further into the stomach. Bibasilar atelectatic changes are noted. IMPRESSION: Nasogastric catheter in the distal esophagus. This should be advanced. Stable bibasilar atelectasis. Electronically Signed   By: Inez Catalina M.D.   On: 02/23/2017 06:58   Dg Chest Port 1 View  Result Date: 02/22/2017 CLINICAL DATA:  Repositioning of the endotracheal tube with placement of the left internal jugular venous catheter. EXAM: PORTABLE CHEST 1 VIEW COMPARISON:  Earlier portable chest x-ray revealing the endotracheal tube at the carina. FINDINGS: The endotracheal tube has been withdrawn and now lies approximately 4 cm above the carina. The left internal jugular venous catheter tip projects over the distal third of the SVC. No postprocedure pneumothorax is observed. The esophagogastric tube has been withdrawn. There is persistent bibasilar interstitial density. The cardiac silhouette is mildly enlarged. There is considerable gaseous distention of bowel under the hemidiaphragms. I cannot exclude free extraluminal gas either. IMPRESSION: Appropriate repositioning of the endotracheal tube. No postprocedure complication following the placement of the left internal jugular venous catheter. Significant gaseous distention of bowel in the upper abdomen. I cannot exclude free extraluminal gas in the upper abdomen. Reportedly the patient has not undergone recent abdominal surgery. A three-way abdominal series or abdominal CT scanning would be useful in an effort to detect any free extraluminal gas in the abdomen which might indicate a perforated viscus. The findings were called by me to the patient's nurse, Mendel Corning, R.N. at 12:55 p.m. on 22 February 2017. She will communicate the  findings to patient's team. Electronically Signed   By: David  Martinique M.D.   On: 02/22/2017 12:57   Dg Chest Port 1 View  Result Date: 02/22/2017 CLINICAL DATA:  Respiratory failure, intubated patient. Status post CVA. EXAM: PORTABLE CHEST 1 VIEW COMPARISON:  Chest x-ray of February 22, 2017 FINDINGS: The lungs are reasonably well inflated. The interstitial markings remain increased but have improved. Confluent densities at both bases are present but also improved. The cardiac silhouette is mildly enlarged. The endotracheal tube tip is at the carina. The esophagogastric tube tip in proximal port lie in the midesophagus. There is marked gaseous distention of the bowel. IMPRESSION: Improved appearance of the pulmonary interstitium bilaterally suggests resolving interstitial edema or less likely pneumonia. The endotracheal tube is at the level of the carina. Withdrawal by 3 cm is recommended. The esophagogastric tube tip in proximal port lie in the  midesophagus. Advancement by approximately 20 cm is recommended. There is considerable gaseous distention of bowel in the upper abdomen. Critical Value/emergent results were called by telephone at the time of interpretation on 02/22/2017 at 10:55 am to Mendel Corning, RN,, who verbally acknowledged these results. Electronically Signed   By: David  Martinique M.D.   On: 02/22/2017 10:57   Dg Addison Bailey G Tube Plc W/fl W/rad  Result Date: 02/22/2017 CLINICAL DATA:  Bowel distension. EXAM: NASO G TUBE PLACEMENT WITH FL AND WITH RAD CONTRAST:  10 cc Omnipaque 300 FLUOROSCOPY TIME:  Fluoroscopy Time: Radiation Exposure Index (if provided by the fluoroscopic device): Number of Acquired Spot Images: 0 COMPARISON:  CT scan and abdominal x-ray from earlier today. FINDINGS: Bedside fluoro evaluation with injection of a small volume water-soluble contrast confirms that is the NG tube is in the large hiatal hernia. Review of the CT scan from earlier today confirms a nearly the entire stomach is  in the thorax with only a portion of the gastric antrum below the hemidiaphragm. NG tube could not be manipulated more distally and in fact doing so would likely be counterproductive as the antrum is so collapsed as it passes through the hiatus. IMPRESSION: NG tube tip is positioned in the large hiatal hernia. About 80-90% of the stomach is in the thorax. Electronically Signed   By: Misty Stanley M.D.   On: 02/22/2017 17:05   Reviewed    DISCUSSION: 83 y.o. male from nursing home, admitted with dyspnea and ileus.  Required intubation after arrival to ED. Surgery consulted for ileus.  ASSESSMENT / PLAN:  PULMONARY A: Acute hypoxic respiratory failure  Aspiration pneumonia P:   SBts but hold off extubation until belly distension decreased VAP prevention measures.   CARDIOVASCULAR A:  Hx HTN, HLD. Septic shock Demand ischemia P:  Unable to give ASA due to flexiseal. Hold preadmission amlodipine, atorvastatin, furosemide, lisinopril. Lactate cleared  RENAL A:   AKI - sepsis + ACEi. AGMA - lactate. Severe hypokalemia P:   LR @ 100. Holding ACEi. Replete lytes & recheck BMP in AM.  GASTROINTESTINAL A:   Colonic ileus with diarrhea Large hiatal hernia Possible cholelithiasis.  P:   CCS & GI following -may need decompression Ct  OGT to LIMS - do not manipulate , is in stomach although appears in esophagus on CXR SUP: famotidine. NPO.  HEMATOLOGIC A:   VTE Prophylaxis. P:  SCD's / heparin. CBC in AM.  INFECTIOUS A:   Septic shock- from intrabd source, aspiration with HCAP P:   Ct  cefepime , dc vanc  dc flagyl Follow cultures  PCT algorithm to limit abx exposure.  ENDOCRINE A:   Hx DM.   P:   SSI. Hold preadmission lantus.  NEUROLOGIC A:   Acute encephalopathy - due to sedation. Hx right hemiplegia secondary to CVA, ? Seizures (on depakote as outpatient). P:   Sedation:  Propofol gtt / Midazolam PRN. Fent prn RASS goal: 0 to -1. Daily  WUA. Continue preadmission depakote, change to IV formulation -levels ok Hold preadmission trazodone.  Family updated: Son updated at bedside.  full code for now  Interdisciplinary Family Meeting v Palliative Care Meeting:  Due by: 03/01/17.  The patient is critically ill with multiple organ system failure and requires high complexity decision making for assessment and support, frequent evaluation and titration of therapies, advanced monitoring, review of radiographic studies and interpretation of complex data.   Critical Care Time devoted to patient care services, exclusive of separately billable  procedures, described in this note is 35 minutes.   Kara Mead MD. Shade Flood. [facility] Pager [phone] If no response call [phone]    02/23/2017, 8:38 AM

## 2017-02-23 NOTE — Progress Notes (Signed)
[facility] Progress Note  Subjective:  No major changes overnight.  Nurse reports a very large amount of flatus last night upon turning the patient.  Also having a small amount of stool - C diff negative.  Objective:  Vital signs in last 24 hours: Temp:  [97.4 F (36.3 C)-98.4 F (36.9 C)] 97.4 F (36.3 C) (08/16 0800) Pulse Rate:  [58-106] 61 (08/16 0700) Resp:  [13-21] 18 (08/16 0700) BP: (82-153)/(41-84) 141/59 (08/16 0738) SpO2:  [96 %-100 %] 100 % (08/16 0738) FiO2 (%):  [50 %-60 %] 50 % (08/16 0738) Weight:  [186 lb 1.1 oz (84.4 kg)-195 lb 1.7 oz (88.5 kg)] 195 lb 1.7 oz (88.5 kg) (08/16 0400) Last BM Date: 02/22/17 General:  Intubated, opens eyes.  Heart:  Regular rate and rhythm; no murmurs Pulm:  Coarse BS noted along with mechanical BS B/L. Abdomen:  Somewhat less distended than yesterday but no bowel sounds.    Extremities:  Without edema.  Intake/Output from previous day: 08/15 0701 - 08/16 0700 In: 5133.5 [I.V.:2469.5; IV Piggyback:2664] Out: 1100 [Urine:1050; Emesis/NG output:50]  Lab Results:  Recent Labs  02/22/17 0226 02/23/17 0314  WBC 16.0* 15.4*  HGB 14.6 11.9*  HCT 43.1 35.9*  PLT 276 175   BMET  Recent Labs  02/22/17 0226 02/23/17 0314  NA 140 141  K 3.6 2.7*  CL 100* 108  CO2 20* 24  GLUCOSE 256* 117*  BUN 60* 46*  CREATININE 3.65* 2.07*  CALCIUM 9.3 7.8*   LFT  Recent Labs  02/22/17 0226  PROT 8.8*  ALBUMIN 4.5  AST 27  ALT 14*  ALKPHOS 72  BILITOT 0.4   PT/INR  Recent Labs  02/22/17 0226 02/22/17 1355  LABPROT 13.2 14.2  INR 1.00 1.10   Ct Abdomen Pelvis Wo Contrast  Result Date: 02/22/2017 CLINICAL DATA:  Question of aspiration. Fever. Concern for free intra-abdominal air on chest radiograph. Initial encounter. EXAM: CT ABDOMEN AND PELVIS WITHOUT CONTRAST TECHNIQUE: Multidetector CT imaging of the abdomen and pelvis was performed following the standard protocol without IV contrast. COMPARISON:   None. FINDINGS: Lower chest: Patchy bilateral airspace opacities raise concern for pneumonia. A large hiatal hernia is noted. Scattered coronary artery calcifications are seen. Hepatobiliary: Calcifications at the gallbladder fossa may reflect stones within a decompressed gallbladder. A vague nonspecific 1.6 cm hypodensity is noted at the right hepatic lobe. The common bile duct remains normal in caliber. Pancreas: The pancreas is within normal limits. Spleen: The spleen is diminutive and grossly unremarkable. Adrenals/Urinary Tract: The adrenal glands are unremarkable in appearance. Nonspecific perinephric stranding is noted bilaterally. Mild bilateral renal atrophy is noted. There is no evidence of hydronephrosis. No renal or ureteral stones are identified. Stomach/Bowel: There is marked distention of small and large bowel loops, concerning for diffuse ileus. The air under the diaphragm on radiograph reflects markedly distended bowel loops. No free intra-abdominal air is seen. The stomach is relatively decompressed and otherwise unremarkable. The appendix is not visualized; there is no evidence for appendicitis. Vascular/Lymphatic: Scattered calcification is seen along the abdominal aorta and its branches. The abdominal aorta is otherwise grossly unremarkable. The inferior vena cava is grossly unremarkable. No retroperitoneal lymphadenopathy is seen. No pelvic sidewall lymphadenopathy is identified. Reproductive: The bladder is decompressed, with a Foley catheter in place. The prostate is normal in size. Other: No significant soft tissue abnormalities are seen. Musculoskeletal: No acute osseous abnormalities are identified. Vacuum phenomenon is noted at the lower lumbar spine. The visualized  musculature is unremarkable in appearance. IMPRESSION: 1. No free intra-abdominal air seen. 2. Marked distention of small and large bowel loops, concerning for diffuse ileus. 3. Patchy bilateral airspace opacities raise  concern for pneumonia. 4. Suspect cholelithiasis. 5. Large hiatal hernia noted. 6. Scattered coronary artery calcifications. 7. Vague nonspecific 1.6 mm hypodensity at the right hepatic lobe. 8. Mild bilateral renal atrophy noted. 9. Scattered aortic atherosclerosis. Electronically Signed   By: Garald Balding M.D.   On: 02/22/2017 04:03   Dg Chest 2 View  Result Date: 02/22/2017 CLINICAL DATA:  Acute onset of congestion and fever. Question of aspiration. Initial encounter. EXAM: CHEST  2 VIEW COMPARISON:  None. FINDINGS: The lungs are hypoexpanded. Vascular crowding and vascular congestion are noted. Increased interstitial markings may reflect pneumonia or possibly interstitial edema. There is no evidence of pleural effusion or pneumothorax. The heart is borderline normal in size. No acute osseous abnormalities are seen. The lateral view demonstrates only diffusely distended bowel loops, likely reflecting ileus. Free intra-abdominal air is a concern, given extensive air under the right hemidiaphragm. IMPRESSION: 1. Vascular congestion noted. Increased interstitial markings may reflect pneumonia or possibly interstitial edema. Lungs hypoexpanded. 2. Diffusely distended bowel loops likely reflects ileus. Suspect free intra-abdominal air. CT of the abdomen and pelvis would be helpful for further evaluation, when and as deemed clinically appropriate. Critical Value/emergent results were called by telephone at the time of interpretation on 02/22/2017 at 2:51 am to Dr. Veatrice Kells, who verbally acknowledged these results. Electronically Signed   By: Garald Balding M.D.   On: 02/22/2017 02:52   Dg Abd 1 View  Result Date: 02/22/2017 CLINICAL DATA:  OG tube placement. EXAM: ABDOMEN - 1 VIEW COMPARISON:  CT abdomen pelvis from same day. FINDINGS: Enteric tube with tip in the mid to distal esophagus. Diffusely dilated loops of bowel, unchanged. IMPRESSION: Enteric tube with tip in the mid to distal esophagus.  Recommend advancement. Electronically Signed   By: Titus Dubin M.D.   On: 02/22/2017 10:45   Dg Chest Port 1 View  Result Date: 02/23/2017 CLINICAL DATA:  Respiratory failure EXAM: PORTABLE CHEST 1 VIEW COMPARISON:  02/22/2017 FINDINGS: Cardiac shadow is stable. Endotracheal tube, nasogastric catheter and left jugular central line are noted. The nasogastric catheter lies in the distal esophagus and could be advanced further into the stomach. Bibasilar atelectatic changes are noted. IMPRESSION: Nasogastric catheter in the distal esophagus. This should be advanced. Stable bibasilar atelectasis. Electronically Signed   By: Inez Catalina M.D.   On: 02/23/2017 06:58   Dg Chest Port 1 View  Result Date: 02/22/2017 CLINICAL DATA:  Repositioning of the endotracheal tube with placement of the left internal jugular venous catheter. EXAM: PORTABLE CHEST 1 VIEW COMPARISON:  Earlier portable chest x-ray revealing the endotracheal tube at the carina. FINDINGS: The endotracheal tube has been withdrawn and now lies approximately 4 cm above the carina. The left internal jugular venous catheter tip projects over the distal third of the SVC. No postprocedure pneumothorax is observed. The esophagogastric tube has been withdrawn. There is persistent bibasilar interstitial density. The cardiac silhouette is mildly enlarged. There is considerable gaseous distention of bowel under the hemidiaphragms. I cannot exclude free extraluminal gas either. IMPRESSION: Appropriate repositioning of the endotracheal tube. No postprocedure complication following the placement of the left internal jugular venous catheter. Significant gaseous distention of bowel in the upper abdomen. I cannot exclude free extraluminal gas in the upper abdomen. Reportedly the patient has not undergone recent abdominal surgery. A  three-way abdominal series or abdominal CT scanning would be useful in an effort to detect any free extraluminal gas in the abdomen  which might indicate a perforated viscus. The findings were called by me to the patient's nurse, Mendel Corning, R.N. at 12:55 p.m. on 22 February 2017. She will communicate the findings to patient's team. Electronically Signed   By: David  Martinique M.D.   On: 02/22/2017 12:57   Dg Chest Port 1 View  Result Date: 02/22/2017 CLINICAL DATA:  Respiratory failure, intubated patient. Status post CVA. EXAM: PORTABLE CHEST 1 VIEW COMPARISON:  Chest x-ray of February 22, 2017 FINDINGS: The lungs are reasonably well inflated. The interstitial markings remain increased but have improved. Confluent densities at both bases are present but also improved. The cardiac silhouette is mildly enlarged. The endotracheal tube tip is at the carina. The esophagogastric tube tip in proximal port lie in the midesophagus. There is marked gaseous distention of the bowel. IMPRESSION: Improved appearance of the pulmonary interstitium bilaterally suggests resolving interstitial edema or less likely pneumonia. The endotracheal tube is at the level of the carina. Withdrawal by 3 cm is recommended. The esophagogastric tube tip in proximal port lie in the midesophagus. Advancement by approximately 20 cm is recommended. There is considerable gaseous distention of bowel in the upper abdomen. Critical Value/emergent results were called by telephone at the time of interpretation on 02/22/2017 at 10:55 am to Mendel Corning, RN,, who verbally acknowledged these results. Electronically Signed   By: David  Martinique M.D.   On: 02/22/2017 10:57   Dg Addison Bailey G Tube Plc W/fl W/rad  Result Date: 02/22/2017 CLINICAL DATA:  Bowel distension. EXAM: NASO G TUBE PLACEMENT WITH FL AND WITH RAD CONTRAST:  10 cc Omnipaque 300 FLUOROSCOPY TIME:  Fluoroscopy Time: Radiation Exposure Index (if provided by the fluoroscopic device): Number of Acquired Spot Images: 0 COMPARISON:  CT scan and abdominal x-ray from earlier today. FINDINGS: Bedside fluoro evaluation with injection of a  small volume water-soluble contrast confirms that is the NG tube is in the large hiatal hernia. Review of the CT scan from earlier today confirms a nearly the entire stomach is in the thorax with only a portion of the gastric antrum below the hemidiaphragm. NG tube could not be manipulated more distally and in fact doing so would likely be counterproductive as the antrum is so collapsed as it passes through the hiatus. IMPRESSION: NG tube tip is positioned in the large hiatal hernia. About 80-90% of the stomach is in the thorax. Electronically Signed   By: Misty Stanley M.D.   On: 02/22/2017 17:05   Assessment / Plan: *Suspected small bowel and colonic ileus secondary to PNA, sepsis: NGT to LIS, rectal tube inserted.  Keep electrolytes WNL's, ideally should keep K+ 4 or greater (was 2.7 this AM).  Will check abdominal x-ray this AM.  Will add Reglan 10 mg IV every 6 hours. * Acute hypoxic respiratory failure due to aspiration PNA. Intubated, sedated. On abx and vent support. * AKI:  Slightly improved today.   LOS: 1 day   ZEHR, JESSICA D.  02/23/2017, 9:33 AM  Pager number [phone]     Attending physician's note   I have taken an interval history, reviewed the chart and examined the patient. I agree with the Advanced Practitioner's note, impression and recommendations. Pt passed a large amount of flatus last evening. Abdominal exam is slightly less distended. Very little NG output likely due to large HH, altered gastric anatomy. Very little rectal  tube output. Check abd film today. Reglan changed to round the clock, not prn. Supplement K+ and attempt to keep K+ > 4. Move pt on side every couple hours to help with flatus.   Lucio Edward, MD [facility] [phone] Mon-Fri 8a-5p [phone] after 5p, weekends, holidays

## 2017-02-24 ENCOUNTER — Inpatient Hospital Stay (HOSPITAL_COMMUNITY): Payer: Medicare Other

## 2017-02-24 DIAGNOSIS — N179 Acute kidney failure, unspecified: Secondary | ICD-10-CM

## 2017-02-24 LAB — BASIC METABOLIC PANEL
ANION GAP: 7 (ref 5–15)
Anion gap: 6 (ref 5–15)
BUN: 24 mg/dL — AB (ref 6–20)
BUN: 21 mg/dL — ABNORMAL HIGH (ref 6–20)
CALCIUM: 7.1 mg/dL — AB (ref 8.9–10.3)
CHLORIDE: 111 mmol/L (ref 101–111)
CO2: 23 mmol/L (ref 22–32)
CO2: 24 mmol/L (ref 22–32)
CREATININE: 1.11 mg/dL (ref 0.61–1.24)
Calcium: 7.6 mg/dL — ABNORMAL LOW (ref 8.9–10.3)
Chloride: 112 mmol/L — ABNORMAL HIGH (ref 101–111)
Creatinine, Ser: 0.99 mg/dL (ref 0.61–1.24)
GFR calc non Af Amer: >60 mL/min (ref >60)
GFR, EST NON AFRICAN AMERICAN: 60 mL/min — AB (ref >60)
Glucose, Bld: 116 mg/dL — ABNORMAL HIGH (ref 65–99)
Glucose, Bld: 132 mg/dL — ABNORMAL HIGH (ref 65–99)
POTASSIUM: 3.5 mmol/L (ref 3.5–5.1)
Potassium: 2.7 mmol/L — CL (ref 3.5–5.1)
SODIUM: 141 mmol/L (ref 135–145)
Sodium: 142 mmol/L (ref 135–145)

## 2017-02-24 LAB — CBC
HCT: 32.5 % — ABNORMAL LOW (ref 39.0–52.0)
HEMOGLOBIN: 10.9 g/dL — AB (ref 13.0–17.0)
MCH: 27 pg (ref 26.0–34.0)
MCHC: 33.5 g/dL (ref 30.0–36.0)
MCV: 80.4 fL (ref 78.0–100.0)
PLATELETS: 144 10*3/uL — AB (ref 150–400)
RBC: 4.04 MIL/uL — ABNORMAL LOW (ref 4.22–5.81)
RDW: 14.1 % (ref 11.5–15.5)
WBC: 10.1 10*3/uL (ref 4.0–10.5)

## 2017-02-24 LAB — GLUCOSE, CAPILLARY
GLUCOSE-CAPILLARY: 130 mg/dL — AB (ref 65–99)
GLUCOSE-CAPILLARY: 116 mg/dL — AB (ref 65–99)
GLUCOSE-CAPILLARY: 86 mg/dL (ref 65–99)
Glucose-Capillary: 114 mg/dL — ABNORMAL HIGH (ref 65–99)
Glucose-Capillary: 125 mg/dL — ABNORMAL HIGH (ref 65–99)
Glucose-Capillary: 77 mg/dL (ref 65–99)
Glucose-Capillary: 79 mg/dL (ref 65–99)

## 2017-02-24 LAB — PHOSPHORUS: PHOSPHORUS: 1.7 mg/dL — AB (ref 2.5–4.6)

## 2017-02-24 LAB — URINE CULTURE: Culture: NO GROWTH

## 2017-02-24 LAB — PROCALCITONIN: PROCALCITONIN: 0.63 ng/mL

## 2017-02-24 LAB — MAGNESIUM: MAGNESIUM: 2.8 mg/dL — AB (ref 1.7–2.4)

## 2017-02-24 MED ORDER — POTASSIUM CHLORIDE 10 MEQ/100ML IV SOLN
10.0000 meq | INTRAVENOUS | Status: AC
  Administered 2017-02-24 – 2017-02-25 (×6): 10 meq via INTRAVENOUS
  Filled 2017-02-24 (×5): qty 100

## 2017-02-24 MED ORDER — POTASSIUM CHLORIDE 10 MEQ/50ML IV SOLN
10.0000 meq | INTRAVENOUS | Status: AC
  Administered 2017-02-24: 10 meq via INTRAVENOUS
  Filled 2017-02-24 (×4): qty 50

## 2017-02-24 MED ORDER — FAMOTIDINE IN NACL 20-0.9 MG/50ML-% IV SOLN
20.0000 mg | Freq: Two times a day (BID) | INTRAVENOUS | Status: DC
  Administered 2017-02-24 – 2017-02-27 (×8): 20 mg via INTRAVENOUS
  Filled 2017-02-24 (×8): qty 50

## 2017-02-24 MED ORDER — POTASSIUM CHLORIDE 10 MEQ/50ML IV SOLN
10.0000 meq | INTRAVENOUS | Status: DC
  Administered 2017-02-24 (×3): 10 meq via INTRAVENOUS
  Filled 2017-02-24 (×6): qty 50

## 2017-02-24 MED ORDER — POTASSIUM PHOSPHATES 15 MMOLE/5ML IV SOLN
30.0000 mmol | Freq: Once | INTRAVENOUS | Status: AC
  Administered 2017-02-24: 30 mmol via INTRAVENOUS
  Filled 2017-02-24: qty 10

## 2017-02-24 NOTE — Progress Notes (Signed)
[facility] NOTE   Pharmacy Consult for TPN Indication: prolonged ileus  Patient Measurements: Height: 5\' 9"  (175.3 cm) Weight: 202 lb 2.6 oz (91.7 kg) IBW/kg (Calculated) : 70.7  AdjBW: 76kg   Body mass index is 29.85 kg/m.  Insulin Requirements: 6 units in past 24hrs  Current Nutrition: NPO  IVF: LR at 23ml/hr  Central access: CVC triple lumen placed 8/15 TPN start date:   ASSESSMENT                                                                                                          HPI: 83 yo M admitted from NH on 02/22/17 with ileus and difficulty breathing.  He remains intubated.  He is not a candidate for tube feedings due to hiatal hernia/risk of aspiration.   He is having bowel sounds/loose stools & should be able to tolerate oral diet once extubated per Dr Elsworth Soho.   Pharmacy consulted to start TPN while patient is NPO.   Significant events:   Today:   Glucose - CBGs at goal (<150) on ICU hyperglycemia protocol.  Hx DM on Lantus PTA.    Electrolytes -   Potassium & Phosphorus LOW.  MD repleting.    Magnesium elevated at 2.8  Ca low at 7.1  Na WNL  Renal - Scr WNL, improving.  24h I/O +1666.2  LFTs - WNL (8/15)  TGs - 122 (8/15)  Prealbumin - ordered  NUTRITIONAL GOALS                                                                                             RD recs: KCAL: 1714kcal/day PROTEIN: 98-115g/day Fluid:  >1.7L/day Clinimix 5/15 at a goal rate of 83 ml/hr + 20% fat emulsion at 20 ml/hr x12h to provide: 100g/day protein, 1894Kcal/day.  Without lipids it will provide 1414 Kcal/day.  PLAN                                                                                                                         Discussed patient with Dr Elsworth Soho & Marni Griffon, NP.  Will defer starting TPN until 8/18 after electrolytes  corrected, hopefully Magnesium normalized.  Patient is at high risk for refeeding (?already  refeeding without TPN based on labs) so will need to titrate TPN slowly once started.    Check TPN labs in am.  Start TPN 8/18 if patient not extubated.   Netta Cedars, PharmD, BCPS Pager: [phone] 02/24/2017,11:05 AM

## 2017-02-24 NOTE — Progress Notes (Signed)
Patient ID: Derek Crawford, male   DOB: 1932/09/07, 83 y.o.   MRN: [id number]     Subjective: He is responsive and able to answer questions on the ventilator. He denies abdominal pain.  Objective: Vital signs in last 24 hours: Temp:  [97.6 F (36.4 C)-98.4 F (36.9 C)] 97.9 F (36.6 C) (08/17 0344) Pulse Rate:  [49-85] 55 (08/17 0600) Resp:  [14-23] 14 (08/17 0600) BP: (102-158)/(40-70) 102/42 (08/17 0600) SpO2:  [99 %-100 %] 100 % (08/17 0600) FiO2 (%):  [40 %] 40 % (08/17 0400) Weight:  [91.7 kg (202 lb 2.6 oz)] 91.7 kg (202 lb 2.6 oz) (08/17 0333) Last BM Date: 02/23/17  Intake/Output from previous day: 08/16 0701 - 08/17 0700 In: 2701 [I.V.:2546; IV Piggyback:155] Out: 7001 [Urine:1345; Stool:375] Intake/Output this shift: No intake/output data recorded.  General appearance: Sedated and comfortable on the ventilator but arouses to voice and answers questions. No distress. GI: Remains very distended and tympanitic but somewhat less tight than yesterday. Nontender. No guarding.  Lab Results:   Recent Labs  02/23/17 0314 02/24/17 0320  WBC 15.4* 10.1  HGB 11.9* 10.9*  HCT 35.9* 32.5*  PLT 175 144*   BMET  Recent Labs  02/23/17 1416 02/24/17 0320  NA 140 141  K 3.4* 2.7*  CL 110 112*  CO2 24 23  GLUCOSE 141* 116*  BUN 37* 24*  CREATININE 1.45* 1.11  CALCIUM 7.7* 7.1*     Studies/Results: Dg Abd 1 View  Result Date: 02/22/2017 CLINICAL DATA:  OG tube placement. EXAM: ABDOMEN - 1 VIEW COMPARISON:  CT abdomen pelvis from same day. FINDINGS: Enteric tube with tip in the mid to distal esophagus. Diffusely dilated loops of bowel, unchanged. IMPRESSION: Enteric tube with tip in the mid to distal esophagus. Recommend advancement. Electronically Signed   By: Titus Dubin M.D.   On: 02/22/2017 10:45   Dg Chest Port 1 View  Result Date: 02/24/2017 CLINICAL DATA:  Respiratory failure. EXAM: PORTABLE CHEST 1 VIEW COMPARISON:  Chest x-ray and abdominal series 816  2018. FINDINGS: Endotracheal tube, NG tube, left IJ line stable position. Heart size normal. Bibasilar atelectasis. No pleural effusion or pneumothorax. Distended loops of bowel again noted IMPRESSION: 1.  Lines and tubes in stable position. 2.  Bibasilar atelectasis and/or infiltrates. 3.  Bowel distention again noted. Electronically Signed   By: Marcello Moores  Register   On: 02/24/2017 06:56   Dg Chest Port 1 View  Result Date: 02/23/2017 CLINICAL DATA:  Respiratory failure EXAM: PORTABLE CHEST 1 VIEW COMPARISON:  02/22/2017 FINDINGS: Cardiac shadow is stable. Endotracheal tube, nasogastric catheter and left jugular central line are noted. The nasogastric catheter lies in the distal esophagus and could be advanced further into the stomach. Bibasilar atelectatic changes are noted. IMPRESSION: Nasogastric catheter in the distal esophagus. This should be advanced. Stable bibasilar atelectasis. Electronically Signed   By: Inez Catalina M.D.   On: 02/23/2017 06:58   Dg Chest Port 1 View  Result Date: 02/22/2017 CLINICAL DATA:  Repositioning of the endotracheal tube with placement of the left internal jugular venous catheter. EXAM: PORTABLE CHEST 1 VIEW COMPARISON:  Earlier portable chest x-ray revealing the endotracheal tube at the carina. FINDINGS: The endotracheal tube has been withdrawn and now lies approximately 4 cm above the carina. The left internal jugular venous catheter tip projects over the distal third of the SVC. No postprocedure pneumothorax is observed. The esophagogastric tube has been withdrawn. There is persistent bibasilar interstitial density. The cardiac silhouette is mildly enlarged.  There is considerable gaseous distention of bowel under the hemidiaphragms. I cannot exclude free extraluminal gas either. IMPRESSION: Appropriate repositioning of the endotracheal tube. No postprocedure complication following the placement of the left internal jugular venous catheter. Significant gaseous distention  of bowel in the upper abdomen. I cannot exclude free extraluminal gas in the upper abdomen. Reportedly the patient has not undergone recent abdominal surgery. A three-way abdominal series or abdominal CT scanning would be useful in an effort to detect any free extraluminal gas in the abdomen which might indicate a perforated viscus. The findings were called by me to the patient's nurse, Mendel Corning, R.N. at 12:55 p.m. on 22 February 2017. She will communicate the findings to patient's team. Electronically Signed   By: David  Martinique M.D.   On: 02/22/2017 12:57   Dg Chest Port 1 View  Result Date: 02/22/2017 CLINICAL DATA:  Respiratory failure, intubated patient. Status post CVA. EXAM: PORTABLE CHEST 1 VIEW COMPARISON:  Chest x-ray of February 22, 2017 FINDINGS: The lungs are reasonably well inflated. The interstitial markings remain increased but have improved. Confluent densities at both bases are present but also improved. The cardiac silhouette is mildly enlarged. The endotracheal tube tip is at the carina. The esophagogastric tube tip in proximal port lie in the midesophagus. There is marked gaseous distention of the bowel. IMPRESSION: Improved appearance of the pulmonary interstitium bilaterally suggests resolving interstitial edema or less likely pneumonia. The endotracheal tube is at the level of the carina. Withdrawal by 3 cm is recommended. The esophagogastric tube tip in proximal port lie in the midesophagus. Advancement by approximately 20 cm is recommended. There is considerable gaseous distention of bowel in the upper abdomen. Critical Value/emergent results were called by telephone at the time of interpretation on 02/22/2017 at 10:55 am to Mendel Corning, RN,, who verbally acknowledged these results. Electronically Signed   By: David  Martinique M.D.   On: 02/22/2017 10:57   Dg Abd Portable 1v  Result Date: 02/23/2017 CLINICAL DATA:  Abdominal distension EXAM: PORTABLE ABDOMEN - 1 VIEW COMPARISON:  Abdomen  film of 02/22/2017 and CT abdomen pelvis of the same day FINDINGS: There is considerable gaseous distention of large and small bowel. No definite distal colonic bowel gas is seen and a distal colonic lesion cannot be excluded. IMPRESSION: Little change in diffuse gaseous distention of large and small bowel. Cannot excluded a distal colonic lesion. Electronically Signed   By: Ivar Drape M.D.   On: 02/23/2017 10:08   Dg Addison Bailey G Tube Plc W/fl W/rad  Result Date: 02/22/2017 CLINICAL DATA:  Bowel distension. EXAM: NASO G TUBE PLACEMENT WITH FL AND WITH RAD CONTRAST:  10 cc Omnipaque 300 FLUOROSCOPY TIME:  Fluoroscopy Time: Radiation Exposure Index (if provided by the fluoroscopic device): Number of Acquired Spot Images: 0 COMPARISON:  CT scan and abdominal x-ray from earlier today. FINDINGS: Bedside fluoro evaluation with injection of a small volume water-soluble contrast confirms that is the NG tube is in the large hiatal hernia. Review of the CT scan from earlier today confirms a nearly the entire stomach is in the thorax with only a portion of the gastric antrum below the hemidiaphragm. NG tube could not be manipulated more distally and in fact doing so would likely be counterproductive as the antrum is so collapsed as it passes through the hiatus. IMPRESSION: NG tube tip is positioned in the large hiatal hernia. About 80-90% of the stomach is in the thorax. Electronically Signed   By: Verda Cumins.D.  On: 02/22/2017 17:05    Anti-infectives: Anti-infectives    Start     Dose/Rate Route Frequency Ordered Stop   02/24/17 0200  vancomycin (VANCOCIN) IVPB 1000 mg/200 mL premix  Status:  Discontinued     1,000 mg 200 mL/hr over 60 Minutes Intravenous Every 48 hours 02/22/17 1051 02/23/17 0848   02/22/17 1200  vancomycin (VANCOCIN) 50 mg/mL oral solution 500 mg  Status:  Discontinued     500 mg Oral Every 6 hours 02/22/17 1018 02/22/17 1019   02/22/17 1200  vancomycin (VANCOCIN) 50 mg/mL oral solution  500 mg  Status:  Discontinued     500 mg Per Tube Every 6 hours 02/22/17 1019 02/22/17 2227   02/22/17 1200  ceFEPIme (MAXIPIME) 2 g in dextrose 5 % 50 mL IVPB     2 g 100 mL/hr over 30 Minutes Intravenous Every 24 hours 02/22/17 1052     02/22/17 1030  metroNIDAZOLE (FLAGYL) IVPB 500 mg  Status:  Discontinued     500 mg 100 mL/hr over 60 Minutes Intravenous Every 8 hours 02/22/17 1014 02/23/17 0848   02/22/17 0300  vancomycin (VANCOCIN) IVPB 1000 mg/200 mL premix     1,000 mg 200 mL/hr over 60 Minutes Intravenous  Once 02/22/17 0257 02/22/17 0412   02/22/17 0300  piperacillin-tazobactam (ZOSYN) IVPB 3.375 g     3.375 g 100 mL/hr over 30 Minutes Intravenous  Once 02/22/17 0257 02/22/17 0345      Assessment/Plan: Severe head adynamic ileus likely secondary to pneumonia. Overall there is some evidence of improvement with normalized WBC and renal function. He did have a bowel movement recorded yesterday. Abdomen remains very distended but no pain or tenderness and I do not have any sense that his abdomen is a source of sepsis. Hypokalemia which is being corrected. Continue bowel rest. Will likely need TNA.     LOS: 2 days    Marisal Swarey T 02/24/2017

## 2017-02-24 NOTE — Progress Notes (Signed)
[facility] Progress Note Patient Name: Derek Crawford DOB: 29-Aug-1932 MRN: [id number]   Date of Service  02/24/2017  HPI/Events of Note  K+ = 2.7 and Creatinine = 1.11.  eICU Interventions  Will replace K+.      Intervention Category Major Interventions: Electrolyte abnormality - evaluation and management  Sallie Staron Eugene 02/24/2017, 4:48 AM

## 2017-02-24 NOTE — Progress Notes (Signed)
[facility] Progress Note Patient Name: Derek Crawford DOB: 07-Dec-1932 MRN: [id number]   Date of Service  02/24/2017  HPI/Events of Note  K 3.5  eICU Interventions  Repleted to keep K above 4     Intervention Category Evaluation Type: Other  [name] 02/24/2017, 8:21 PM

## 2017-02-24 NOTE — Progress Notes (Signed)
Nutrition Follow-up  INTERVENTION:   Monitor magnesium, potassium, and phosphorus daily for at least 3 days, MD to replete as needed, as pt is at risk for refeeding syndrome given low Mg, Phos, K levels.  TPN per Pharmacy  RD will continue to monitor plan  NUTRITION DIAGNOSIS:   Inadequate oral intake related to inability to eat as evidenced by NPO status.  Ongoing.  GOAL:   Patient will meet greater than or equal to 90% of their needs  Not meeting.  MONITOR:   Diet advancement, Labs, Vent status, Weight trends, I & O's (TPN)  REASON FOR ASSESSMENT:   Consult New TPN/TNA  ASSESSMENT:   83 y.o. 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  Required intubation after arrival to ICU. Surgery consulted for ileus.  RD consulted for patient expected to start TPN. Weight is +7 lb since 8/16, used weight from 8/16 for calculation of needs. Given low levels of Mg, Phos, K, would continue to monitor for signs of refeeding syndrome.  Patient is currently intubated on ventilator support MV: 10 L/min Temp (24hrs), Avg:98.2 F (36.8 C), Min:97.6 F (36.4 C), Max:98.4 F (36.9 C)  Medications: IV Reglan every 6 hours, Lactated Ringers infusion at 75 ml/hr Labs reviewed: Low K, Phos, Mg  Plan per Pharmacy 8/17: - Will defer starting TPN until 8/18 after electrolytes corrected, hopefully Magnesium normalized.  Patient is at high risk for refeeding (?already refeeding without TPN based on labs) so will need to titrate TPN slowly once started.    8/16: -Pt with hemiplegia s/p CVA and wheelchair bound at baseline -Pt sedated on vent. No family at bedside. Pt with large hiatal hernia with majority of the stomach in his chest per IR. Per GI note, "Suspected small bowel and colonic ileus secondary to PNA, sepsis: NGT to LIS, rectal tube inserted." No recent weight history documented but pt appears wt stable  compared to 2017 weights. -Pt appears well nourished. If unable to initiate enteral feeds within 5 days would recommend TPN.   Diet Order:  Diet NPO time specified  Skin:  Reviewed, no issues  Last BM:  8/17  Height:   Ht Readings from Last 1 Encounters:  02/22/17 5\' 9"  (1.753 m)    Weight:   Wt Readings from Last 1 Encounters:  02/24/17 202 lb 2.6 oz (91.7 kg)    Ideal Body Weight:  72.7 kg  BMI:  Body mass index is 29.85 kg/m.  Estimated Nutritional Needs:   Kcal:  1778  Protein:  98-115g/day   Fluid:  >1.7L/day   EDUCATION NEEDS:   No education needs identified at this time  Clayton Bibles, MS, RD, LDN Pager: [phone] After Hours Pager: [phone]

## 2017-02-24 NOTE — Progress Notes (Signed)
[facility] Progress Note  Subjective:  Son was at bedside and I spoke with him for quite some time.  Has been passing flatus and putting out liquid stool.  Objective:  Vital signs in last 24 hours: Temp:  [97.6 F (36.4 C)-98.4 F (36.9 C)] 98.2 F (36.8 C) (08/17 0800) Pulse Rate:  [49-85] 59 (08/17 0800) Resp:  [14-22] 18 (08/17 0800) BP: (102-158)/(40-70) 110/60 (08/17 0800) SpO2:  [99 %-100 %] 100 % (08/17 0800) FiO2 (%):  [40 %] 40 % (08/17 0400) Weight:  [202 lb 2.6 oz (91.7 kg)] 202 lb 2.6 oz (91.7 kg) (08/17 0333) Last BM Date: 02/24/17 General:  Still intubated but awake and alert and will respond when spoken to. Heart:  Regular rate and rhythm; no murmurs Pulm:  Coarse BS heard B/L along with mechanical sounds. Abdomen:  Still distended but seems slightly improved form yesterday.  He has quite a few bowel sounds today.  Non-tender.  Extremities:  Without edema.  Intake/Output from previous day: 08/16 0701 - 08/17 0700 In: 2701 [I.V.:2546; IV Piggyback:155] Out: 1720 [Urine:1345; Stool:375] Intake/Output this shift: Total I/O In: -  Out: 145 [Urine:145]  Lab Results:  Recent Labs  02/22/17 0226 02/23/17 0314 02/24/17 0320  WBC 16.0* 15.4* 10.1  HGB 14.6 11.9* 10.9*  HCT 43.1 35.9* 32.5*  PLT 276 175 144*   BMET  Recent Labs  02/23/17 0314 02/23/17 1416 02/24/17 0320  NA 141 140 141  K 2.7* 3.4* 2.7*  CL 108 110 112*  CO2 24 24 23   GLUCOSE 117* 141* 116*  BUN 46* 37* 24*  CREATININE 2.07* 1.45* 1.11  CALCIUM 7.8* 7.7* 7.1*   LFT  Recent Labs  02/22/17 0226  PROT 8.8*  ALBUMIN 4.5  AST 27  ALT 14*  ALKPHOS 72  BILITOT 0.4   PT/INR  Recent Labs  02/22/17 0226 02/22/17 1355  LABPROT 13.2 14.2  INR 1.00 1.10   Dg Abd 1 View  Result Date: 02/22/2017 CLINICAL DATA:  OG tube placement. EXAM: ABDOMEN - 1 VIEW COMPARISON:  CT abdomen pelvis from same day. FINDINGS: Enteric tube with tip in the mid to distal  esophagus. Diffusely dilated loops of bowel, unchanged. IMPRESSION: Enteric tube with tip in the mid to distal esophagus. Recommend advancement. Electronically Signed   By: Titus Dubin M.D.   On: 02/22/2017 10:45   Dg Chest Port 1 View  Result Date: 02/24/2017 CLINICAL DATA:  Respiratory failure. EXAM: PORTABLE CHEST 1 VIEW COMPARISON:  Chest x-ray and abdominal series 816 2018. FINDINGS: Endotracheal tube, NG tube, left IJ line stable position. Heart size normal. Bibasilar atelectasis. No pleural effusion or pneumothorax. Distended loops of bowel again noted IMPRESSION: 1.  Lines and tubes in stable position. 2.  Bibasilar atelectasis and/or infiltrates. 3.  Bowel distention again noted. Electronically Signed   By: Marcello Moores  Register   On: 02/24/2017 06:56   Dg Chest Port 1 View  Result Date: 02/23/2017 CLINICAL DATA:  Respiratory failure EXAM: PORTABLE CHEST 1 VIEW COMPARISON:  02/22/2017 FINDINGS: Cardiac shadow is stable. Endotracheal tube, nasogastric catheter and left jugular central line are noted. The nasogastric catheter lies in the distal esophagus and could be advanced further into the stomach. Bibasilar atelectatic changes are noted. IMPRESSION: Nasogastric catheter in the distal esophagus. This should be advanced. Stable bibasilar atelectasis. Electronically Signed   By: Inez Catalina M.D.   On: 02/23/2017 06:58   Dg Chest Port 1 View  Result Date: 02/22/2017 CLINICAL DATA:  Repositioning of the endotracheal tube with placement of the left internal jugular venous catheter. EXAM: PORTABLE CHEST 1 VIEW COMPARISON:  Earlier portable chest x-ray revealing the endotracheal tube at the carina. FINDINGS: The endotracheal tube has been withdrawn and now lies approximately 4 cm above the carina. The left internal jugular venous catheter tip projects over the distal third of the SVC. No postprocedure pneumothorax is observed. The esophagogastric tube has been withdrawn. There is persistent bibasilar  interstitial density. The cardiac silhouette is mildly enlarged. There is considerable gaseous distention of bowel under the hemidiaphragms. I cannot exclude free extraluminal gas either. IMPRESSION: Appropriate repositioning of the endotracheal tube. No postprocedure complication following the placement of the left internal jugular venous catheter. Significant gaseous distention of bowel in the upper abdomen. I cannot exclude free extraluminal gas in the upper abdomen. Reportedly the patient has not undergone recent abdominal surgery. A three-way abdominal series or abdominal CT scanning would be useful in an effort to detect any free extraluminal gas in the abdomen which might indicate a perforated viscus. The findings were called by me to the patient's nurse, Mendel Corning, R.N. at 12:55 p.m. on 22 February 2017. She will communicate the findings to patient's team. Electronically Signed   By: David  Martinique M.D.   On: 02/22/2017 12:57   Dg Chest Port 1 View  Result Date: 02/22/2017 CLINICAL DATA:  Respiratory failure, intubated patient. Status post CVA. EXAM: PORTABLE CHEST 1 VIEW COMPARISON:  Chest x-ray of February 22, 2017 FINDINGS: The lungs are reasonably well inflated. The interstitial markings remain increased but have improved. Confluent densities at both bases are present but also improved. The cardiac silhouette is mildly enlarged. The endotracheal tube tip is at the carina. The esophagogastric tube tip in proximal port lie in the midesophagus. There is marked gaseous distention of the bowel. IMPRESSION: Improved appearance of the pulmonary interstitium bilaterally suggests resolving interstitial edema or less likely pneumonia. The endotracheal tube is at the level of the carina. Withdrawal by 3 cm is recommended. The esophagogastric tube tip in proximal port lie in the midesophagus. Advancement by approximately 20 cm is recommended. There is considerable gaseous distention of bowel in the upper abdomen.  Critical Value/emergent results were called by telephone at the time of interpretation on 02/22/2017 at 10:55 am to Mendel Corning, RN,, who verbally acknowledged these results. Electronically Signed   By: David  Martinique M.D.   On: 02/22/2017 10:57   Dg Abd Portable 1v  Result Date: 02/23/2017 CLINICAL DATA:  Abdominal distension EXAM: PORTABLE ABDOMEN - 1 VIEW COMPARISON:  Abdomen film of 02/22/2017 and CT abdomen pelvis of the same day FINDINGS: There is considerable gaseous distention of large and small bowel. No definite distal colonic bowel gas is seen and a distal colonic lesion cannot be excluded. IMPRESSION: Little change in diffuse gaseous distention of large and small bowel. Cannot excluded a distal colonic lesion. Electronically Signed   By: Ivar Drape M.D.   On: 02/23/2017 10:08   Dg Addison Bailey G Tube Plc W/fl W/rad  Result Date: 02/22/2017 CLINICAL DATA:  Bowel distension. EXAM: NASO G TUBE PLACEMENT WITH FL AND WITH RAD CONTRAST:  10 cc Omnipaque 300 FLUOROSCOPY TIME:  Fluoroscopy Time: Radiation Exposure Index (if provided by the fluoroscopic device): Number of Acquired Spot Images: 0 COMPARISON:  CT scan and abdominal x-ray from earlier today. FINDINGS: Bedside fluoro evaluation with injection of a small volume water-soluble contrast confirms that is the NG tube is in the large hiatal hernia. Review  of the CT scan from earlier today confirms a nearly the entire stomach is in the thorax with only a portion of the gastric antrum below the hemidiaphragm. NG tube could not be manipulated more distally and in fact doing so would likely be counterproductive as the antrum is so collapsed as it passes through the hiatus. IMPRESSION: NG tube tip is positioned in the large hiatal hernia. About 80-90% of the stomach is in the thorax. Electronically Signed   By: Misty Stanley M.D.   On: 02/22/2017 17:05   Assessment / Plan: * Suspected small bowel and colonic ileus secondary to PNA, sepsis: NGT to LIS, rectal  tube/flexiseal inserted.  Keep electrolytes WNL's, ideally should keep K+ 4 or greater (was 2.7 again this AM).  Reglan 10 mg IV every 6 hours was added 8/16. * Acute hypoxic respiratory failure due to aspiration PNA. Still intubated but sedation has been backed down and he is now alert.  On abx and vent support. * AKI:  Improving.  -Continue with present care:  Rectal tube, reglan, bowel rest, turning patient side to side, correcting electrolyte abnormalities.   LOS: 2 days   ZEHR, JESSICA D.  02/24/2017, 9:09 AM  Pager number [phone]     Attending physician's note   I have taken an interval history, reviewed the chart and examined the patient. I agree with the Advanced Practitioner's note, impression and recommendations. Mildly improved, persistent SB and colonic ileus secondary to PNA, sepsis. Continue current mgmt as outlined above.   Lucio Edward, MD [facility] [phone] Mon-Fri 8a-5p [phone] after 5p, weekends, holidays

## 2017-02-24 NOTE — Progress Notes (Signed)
PULMONARY / CRITICAL CARE MEDICINE   Name: Derek Crawford MRN: [id number] DOB: 05-12-1933    ADMISSION DATE:  02/22/2017 CONSULTATION DATE:  02/22/17  REFERRING MD:  Randal Buba  CHIEF COMPLAINT:  Hypoxia  BRIEF DESCRIPTION:  83 y.o. 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  Required intubation after arrival to ICU.   STUDIES:  CT A / P 8/14 > no free air.  Diffuse ileus.  Suspected cholelithiasis. Large hiatal hernia. Mild renal atrophy.  Patchy bilateral airspace opacities.  CULTURES: Blood 8/14 > ng Sputum 8/14 >  c diff neg  ANTIBIOTICS: Vanc 8/14 > 8/16 Zosyn 8/14 > 8/15 Cefepime 8/15 >>  SIGNIFICANT EVENTS: 8/14 > admitted.  Started on BiPAP but failed and required intubation.  CCS consulted for ileus. 8/15 NG placed by IR  LINES/TUBES: ETT 8/14 >  LIJ 8/15 >>   SUBJECTIVE:  Remains critically ill, intubated,sedated -on propofol gtt Afebrile Stool in rectal tube  VITAL SIGNS: BP 110/60   Pulse (!) 59   Temp 98.2 F (36.8 C) (Axillary)   Resp 18   Ht 5\' 9"  (1.753 m)   Wt 202 lb 2.6 oz (91.7 kg)   SpO2 100%   BMI 29.85 kg/m   HEMODYNAMICS: CVP:  [9 mmHg-36 mmHg] 9 mmHg  VENTILATOR SETTINGS: Vent Mode: PRVC FiO2 (%):  [40 %] 40 % Set Rate:  [14 bmp] 14 bmp Vt Set:  [570 mL] 570 mL PEEP:  [5 cmH20] 5 cmH20 Plateau Pressure:  [22 cmH20-27 cmH20] 22 cmH20  INTAKE / OUTPUT: I/O last 3 completed shifts: In: 4733.6 [I.V.:4168.6; IV Piggyback:565] Out: 2645 [Urine:2220; Emesis/NG output:50; Stool:375]   PHYSICAL EXAMINATION:  Gen:     Elderly man, no distress HEENT:  EOMI, sclera anicteric , oral ETT Neck:     No masses; no thyromegaly Lungs:    B/L decreased BS ,normal increased respiratory effort CV:        Regular RR; no murmurs Abd:     Marked distention, no guarding. Diminished bowel sounds Ext:    No edema; adequate peripheral perfusion Skin:      Warm and  dry; no rash Neuro: Awake,follows 1 step commands  LABS:  BMET  Recent Labs Lab 02/23/17 0314 02/23/17 1416 02/24/17 0320  NA 141 140 141  K 2.7* 3.4* 2.7*  CL 108 110 112*  CO2 24 24 23   BUN 46* 37* 24*  CREATININE 2.07* 1.45* 1.11  GLUCOSE 117* 141* 116*    Electrolytes  Recent Labs Lab 02/23/17 0314 02/23/17 1416 02/24/17 0320  CALCIUM 7.8* 7.7* 7.1*  MG 3.5*  --  2.8*  PHOS 3.3  --  1.7*    CBC  Recent Labs Lab 02/22/17 0226 02/23/17 0314 02/24/17 0320  WBC 16.0* 15.4* 10.1  HGB 14.6 11.9* 10.9*  HCT 43.1 35.9* 32.5*  PLT 276 175 144*    Coag's  Recent Labs Lab 02/22/17 0226 02/22/17 1355  INR 1.00 1.10    Sepsis Markers  Recent Labs Lab 02/22/17 0254 02/22/17 0711 02/22/17 1355 02/23/17 0314 02/24/17 0320  LATICACIDVEN 5.35* 4.27* 2.5*  --   --   PROCALCITON  --   --  1.54 1.14 0.63    ABG  Recent Labs Lab 02/22/17 0524 02/22/17 1150 02/23/17 0350  PHART 7.275* 7.296* 7.367  PCO2ART 39.6 46.2 39.1  PO2ART 116* 225* 98.7    Liver Enzymes  Recent Labs Lab 02/22/17 0226  AST 27  ALT 14*  ALKPHOS 72  BILITOT 0.4  ALBUMIN 4.5    Cardiac Enzymes  Recent Labs Lab 02/22/17 2145 02/23/17 0314 02/23/17 0816  TROPONINI 0.06* 0.05* 0.04*    Glucose  Recent Labs Lab 02/23/17 1558 02/23/17 1922 02/23/17 2307 02/24/17 0054 02/24/17 0309 02/24/17 0737  GLUCAP 141* 120* 121* 125* 130* 77    Imaging Dg Chest Port 1 View  Result Date: 02/24/2017 CLINICAL DATA:  Respiratory failure. EXAM: PORTABLE CHEST 1 VIEW COMPARISON:  Chest x-ray and abdominal series 816 2018. FINDINGS: Endotracheal tube, NG tube, left IJ line stable position. Heart size normal. Bibasilar atelectasis. No pleural effusion or pneumothorax. Distended loops of bowel again noted IMPRESSION: 1.  Lines and tubes in stable position. 2.  Bibasilar atelectasis and/or infiltrates. 3.  Bowel distention again noted. Electronically Signed   By: Marcello Moores   Register   On: 02/24/2017 06:56   Dg Abd Portable 1v  Result Date: 02/23/2017 CLINICAL DATA:  Abdominal distension EXAM: PORTABLE ABDOMEN - 1 VIEW COMPARISON:  Abdomen film of 02/22/2017 and CT abdomen pelvis of the same day FINDINGS: There is considerable gaseous distention of large and small bowel. No definite distal colonic bowel gas is seen and a distal colonic lesion cannot be excluded. IMPRESSION: Little change in diffuse gaseous distention of large and small bowel. Cannot excluded a distal colonic lesion. Electronically Signed   By: Ivar Drape M.D.   On: 02/23/2017 10:08   Reviewed    DISCUSSION: 83 y.o. male from nursing home, admitted with dyspnea and ileus.  Required intubation after arrival to ED. Surgery consulted for ileus.  ASSESSMENT / PLAN:  PULMONARY A: Acute hypoxic respiratory failure  Aspiration pneumonia P:   SBts but hold off extubation until belly distension decreased VAP prevention measures.   CARDIOVASCULAR A:  Hx HTN, HLD. Septic shock, lactate cleared Demand ischemia P:  Unable to give ASA due to flexiseal. Hold preadmission amlodipine, atorvastatin, furosemide, lisinopril.  RENAL A:   AKI - sepsis + ACEi. AGMA - lactate. Severe hypokalemia /hypophos P:   LR @ 75 Holding ACEi. Replete lytes & recheck - K & phos BMP in AM.  GASTROINTESTINAL A:   Colonic ileus with diarrhea Large hiatal hernia Possible cholelithiasis.  P:   CCS & GI following -improving with reglan Ct  OGT to LIMS - do not manipulate , is in stomach although appears in esophagus on CXR, cannot use for TFs, hence will start TNA SUP: famotidine. NPO.  HEMATOLOGIC A:   VTE Prophylaxis. P:  SCD's / heparin. CBC in AM.  INFECTIOUS A:   Septic shock- from intrabd source, aspiration with HCAP P:   Ct  cefepime , off other abx Follow cultures  PCT algorithm to limit abx exposure -plan 5 ds  ENDOCRINE A:   Hx DM.   P:   SSI. Hold preadmission  lantus.  NEUROLOGIC A:   Acute encephalopathy - due to sedation. Hx right hemiplegia secondary to CVA, ? Seizures (on depakote as outpatient). P:   Sedation:  Propofol gtt / Midazolam PRN. Fent prn RASS goal: 0 to -1. Daily WUA. Continue preadmission depakote via IV -levels ok Hold preadmission trazodone.  Family updated: Son updated at bedside.  full code for now  Interdisciplinary Family Meeting v Palliative Care Meeting:  Due by: 03/01/17.  The patient is critically ill with multiple organ system failure and requires high complexity decision making for assessment and support, frequent evaluation and titration of therapies, advanced monitoring, review of  radiographic studies and interpretation of complex data.   Critical Care Time devoted to patient care services, exclusive of separately billable procedures, described in this note is 33 minutes.   Kara Mead MD. [facility]. [facility] Pager [phone] If no response call [phone]    02/24/2017, 8:47 AM

## 2017-02-24 NOTE — Progress Notes (Addendum)
CRITICAL VALUE ALERT  Critical Value:  Potassium 2.7  Date & Time Notied:  02/24/17 @ 4:37am   Provider Notified: Dr.Sommer

## 2017-02-25 ENCOUNTER — Inpatient Hospital Stay (HOSPITAL_COMMUNITY): Payer: Medicare Other

## 2017-02-25 DIAGNOSIS — J9600 Acute respiratory failure, unspecified whether with hypoxia or hypercapnia: Secondary | ICD-10-CM

## 2017-02-25 LAB — TRIGLYCERIDES
Triglycerides: 97 mg/dL (ref <150)
Triglycerides: 103 mg/dL (ref <150)

## 2017-02-25 LAB — GLUCOSE, CAPILLARY
GLUCOSE-CAPILLARY: 80 mg/dL (ref 65–99)
GLUCOSE-CAPILLARY: 106 mg/dL — AB (ref 65–99)
Glucose-Capillary: 78 mg/dL (ref 65–99)
Glucose-Capillary: 90 mg/dL (ref 65–99)
Glucose-Capillary: 105 mg/dL — ABNORMAL HIGH (ref 65–99)
Glucose-Capillary: 82 mg/dL (ref 65–99)

## 2017-02-25 LAB — CBC
HEMATOCRIT: 32.8 % — AB (ref 39.0–52.0)
Hemoglobin: 10.8 g/dL — ABNORMAL LOW (ref 13.0–17.0)
MCH: 26.2 pg (ref 26.0–34.0)
MCHC: 32.9 g/dL (ref 30.0–36.0)
MCV: 79.4 fL (ref 78.0–100.0)
PLATELETS: 121 10*3/uL — AB (ref 150–400)
RBC: 4.13 MIL/uL — ABNORMAL LOW (ref 4.22–5.81)
RDW: 14 % (ref 11.5–15.5)
WBC: 6.7 10*3/uL (ref 4.0–10.5)

## 2017-02-25 LAB — COMPREHENSIVE METABOLIC PANEL
ALBUMIN: 2.3 g/dL — AB (ref 3.5–5.0)
ALT: 15 U/L — ABNORMAL LOW (ref 17–63)
AST: 23 U/L (ref 15–41)
Alkaline Phosphatase: 38 U/L (ref 38–126)
Anion gap: 5 (ref 5–15)
BUN: 15 mg/dL (ref 6–20)
CHLORIDE: 110 mmol/L (ref 101–111)
CO2: 25 mmol/L (ref 22–32)
Calcium: 7.5 mg/dL — ABNORMAL LOW (ref 8.9–10.3)
Creatinine, Ser: 0.92 mg/dL (ref 0.61–1.24)
GFR calc Af Amer: >60 mL/min (ref >60)
GFR calc non Af Amer: >60 mL/min (ref >60)
GLUCOSE: 84 mg/dL (ref 65–99)
POTASSIUM: 3.5 mmol/L (ref 3.5–5.1)
Sodium: 140 mmol/L (ref 135–145)
Total Bilirubin: 0.7 mg/dL (ref 0.3–1.2)
Total Protein: 5.4 g/dL — ABNORMAL LOW (ref 6.5–8.1)

## 2017-02-25 LAB — DIFFERENTIAL
BASOS ABS: 0 10*3/uL (ref 0.0–0.1)
Basophils Relative: 0 %
EOS PCT: 3 %
Eosinophils Absolute: 0.2 10*3/uL (ref 0.0–0.7)
LYMPHS ABS: 1.6 10*3/uL (ref 0.7–4.0)
LYMPHS PCT: 24 %
MONOS PCT: 10 %
Monocytes Absolute: 0.7 10*3/uL (ref 0.1–1.0)
NEUTROS PCT: 63 %
Neutro Abs: 4.2 10*3/uL (ref 1.7–7.7)

## 2017-02-25 LAB — MAGNESIUM: Magnesium: 2.4 mg/dL (ref 1.7–2.4)

## 2017-02-25 LAB — PREALBUMIN: Prealbumin: 9.3 mg/dL — ABNORMAL LOW (ref 18–38)

## 2017-02-25 LAB — PHOSPHORUS: Phosphorus: 2.1 mg/dL — ABNORMAL LOW (ref 2.5–4.6)

## 2017-02-25 MED ORDER — LACTATED RINGERS IV SOLN
INTRAVENOUS | Status: AC
  Administered 2017-02-25: 18:00:00 via INTRAVENOUS

## 2017-02-25 MED ORDER — TRACE MINERALS CR-CU-MN-SE-ZN 10-1000-500-60 MCG/ML IV SOLN
INTRAVENOUS | Status: AC
  Administered 2017-02-25: 18:00:00 via INTRAVENOUS
  Filled 2017-02-25: qty 720

## 2017-02-25 MED ORDER — DEXTROSE 5 % IV SOLN
1.0000 g | Freq: Three times a day (TID) | INTRAVENOUS | Status: AC
  Administered 2017-02-25 – 2017-02-28 (×10): 1 g via INTRAVENOUS
  Filled 2017-02-25 (×11): qty 1

## 2017-02-25 MED ORDER — POTASSIUM PHOSPHATES 15 MMOLE/5ML IV SOLN
20.0000 mmol | Freq: Once | INTRAVENOUS | Status: AC
  Administered 2017-02-25: 20 mmol via INTRAVENOUS
  Filled 2017-02-25: qty 6.67

## 2017-02-25 MED ORDER — MIDAZOLAM HCL 2 MG/2ML IJ SOLN
2.0000 mg | INTRAMUSCULAR | Status: DC | PRN
  Administered 2017-02-25 (×2): 2 mg via INTRAVENOUS
  Filled 2017-02-25 (×2): qty 2

## 2017-02-25 MED ORDER — POTASSIUM CHLORIDE 10 MEQ/100ML IV SOLN
10.0000 meq | INTRAVENOUS | Status: AC
  Administered 2017-02-25 (×6): 10 meq via INTRAVENOUS
  Filled 2017-02-25 (×6): qty 100

## 2017-02-25 MED ORDER — SODIUM CHLORIDE 0.9 % IV SOLN
1.0000 mg/h | INTRAVENOUS | Status: DC
  Administered 2017-02-25 – 2017-02-26 (×2): 1 mg/h via INTRAVENOUS
  Filled 2017-02-25 (×2): qty 10

## 2017-02-25 NOTE — Progress Notes (Signed)
PULMONARY / CRITICAL CARE MEDICINE   Name: Derek Crawford MRN: [id number] DOB: 01-25-1933    ADMISSION DATE:  02/22/2017 CONSULTATION DATE:  02/22/17  REFERRING MD:  Randal Buba  CHIEF COMPLAINT:  Hypoxia  BRIEF DESCRIPTION:  83 y.o. 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  Required intubation after arrival to ICU.  STUDIES:  CT A / P 8/14 > no free air.  Diffuse ileus.  Suspected cholelithiasis. Large hiatal hernia. Mild renal atrophy.  Patchy bilateral airspace opacities.  CULTURES: Blood 8/14 > ng Sputum 8/14 >  c diff neg  ANTIBIOTICS: Vanc 8/14 > 8/16 Zosyn 8/14 > 8/15 Cefepime 8/15 >>  SIGNIFICANT EVENTS: 8/14 > admitted.  Started on BiPAP but failed and required intubation.  CCS consulted for ileus. 8/15 NG placed by IR  LINES/TUBES: ETT 8/14 >  LIJ 8/15 >>  SUBJECTIVE:  Remains intubated Agitated on propofol gtt  VITAL SIGNS: BP (!) 139/59   Pulse (!) 56   Temp 97.6 F (36.4 C) (Axillary)   Resp 13   Ht 5\' 9"  (1.753 m)   Wt 93.6 kg (206 lb 5.6 oz)   SpO2 100%   BMI 30.47 kg/m   HEMODYNAMICS: CVP:  [6 mmHg] 6 mmHg  VENTILATOR SETTINGS: Vent Mode: PSV;CPAP FiO2 (%):  [40 %] 40 % Set Rate:  [14 bmp] 14 bmp Vt Set:  [570 mL] 570 mL PEEP:  [5 cmH20] 5 cmH20 Pressure Support:  [8 cmH20-10 cmH20] 10 cmH20 Plateau Pressure:  [16 cmH20-23 cmH20] 22 cmH20  INTAKE / OUTPUT: I/O last 3 completed shifts: In: 4059 [I.V.:3104; IV Piggyback:955] Out: 1985 [Urine:1560; Stool:425]   PHYSICAL EXAMINATION: Gen:      No acute distress HEENT:  EOMI, sclera anicteric, ETT in place Neck:     No masses; no thyromegaly Lungs:    Clear to auscultation bilaterally; normal respiratory effort CV:         Regular rate and rhythm; no murmurs Abd:      Distended, no guarding, rigidity Ext:    No edema; adequate peripheral perfusion Skin:      Warm and dry; no rash Neuro: Awake, follows  commands  LABS:  BMET  Recent Labs Lab 02/24/17 0320 02/24/17 1631 02/25/17 0440  NA 141 142 140  K 2.7* 3.5 3.5  CL 112* 111 110  CO2 23 24 25   BUN 24* 21* 15  CREATININE 1.11 0.99 0.92  GLUCOSE 116* 132* 84    Electrolytes  Recent Labs Lab 02/23/17 0314  02/24/17 0320 02/24/17 1631 02/25/17 0440  CALCIUM 7.8*  < > 7.1* 7.6* 7.5*  MG 3.5*  --  2.8*  --  2.4  PHOS 3.3  --  1.7*  --  2.1*  < > = values in this interval not displayed.  CBC  Recent Labs Lab 02/23/17 0314 02/24/17 0320 02/25/17 0312  WBC 15.4* 10.1 6.7  HGB 11.9* 10.9* 10.8*  HCT 35.9* 32.5* 32.8*  PLT 175 144* 121*    Coag's  Recent Labs Lab 02/22/17 0226 02/22/17 1355  INR 1.00 1.10    Sepsis Markers  Recent Labs Lab 02/22/17 0254 02/22/17 0711 02/22/17 1355 02/23/17 0314 02/24/17 0320  LATICACIDVEN 5.35* 4.27* 2.5*  --   --   PROCALCITON  --   --  1.54 1.14 0.63    ABG  Recent Labs Lab 02/22/17 0524 02/22/17 1150 02/23/17 0350  PHART 7.275* 7.296* 7.367  PCO2ART  39.6 46.2 39.1  PO2ART 116* 225* 98.7    Liver Enzymes  Recent Labs Lab 02/22/17 0226 02/25/17 0440  AST 27 23  ALT 14* 15*  ALKPHOS 72 38  BILITOT 0.4 0.7  ALBUMIN 4.5 2.3*    Cardiac Enzymes  Recent Labs Lab 02/22/17 2145 02/23/17 0314 02/23/17 0816  TROPONINI 0.06* 0.05* 0.04*    Glucose  Recent Labs Lab 02/24/17 1540 02/24/17 1934 02/24/17 2328 02/25/17 0357 02/25/17 0805 02/25/17 1141  GLUCAP 116* 114* 86 80 78 90    Imaging Dg Chest Port 1 View  Result Date: 02/25/2017 CLINICAL DATA:  Acute respiratory failure EXAM: PORTABLE CHEST 1 VIEW COMPARISON:  Yesterday FINDINGS: Endotracheal tube tip at the clavicular heads. Trachea is deviated chronically, likely due to the aorta. Left IJ central line with tip at the upper SVC. There is a NG tube which could not be advanced through the hiatal hernia. The tube is shorter than on 02/22/2017 fluoroscopy. Bibasal streaky opacity.  Low lung volumes. Stable heart size. No effusion or pneumothorax. Known gaseous distention of colon. IMPRESSION: 1. Unchanged positioning of tubes and central line. Advancement of the NG tube by 5 cm would place the tip closer to the hiatal hernia. 2. Unchanged atelectasis or pneumonia at the bases. Electronically Signed   By: Monte Fantasia M.D.   On: 02/25/2017 07:05   Reviewed    DISCUSSION: 83 y.o. male from nursing home, admitted with dyspnea and ileus.  Required intubation after arrival to ED. Surgery consulted for ileus.  ASSESSMENT / PLAN:  PULMONARY A: Acute hypoxic respiratory failure  Aspiration pneumonia P:   Continue SBTs May get extubated if abd distention improves  CARDIOVASCULAR A:  Hx HTN, HLD. Septic shock, lactate cleared Demand ischemia P:  Unable to give ASA due to flexiseal. Hold preadmission amlodipine, atorvastatin, furosemide, lisinopril.  RENAL A:   AKI - sepsis + ACEi. AGMA - lactate. Severe hypokalemia /hypophos P:   Holding ACEI. Replete K. Keep levels above 4 BMP in AM.  GASTROINTESTINAL A:   Colonic ileus with diarrhea Large hiatal hernia Possible cholelithiasis.  P:   CCS & GI following -improving with reglan Ct  OGT to LIMS - do not manipulate , is in stomach although appears in esophagus on CXR. Start TNA. SUP: famotidine. NPO.  HEMATOLOGIC A:   VTE Prophylaxis. P:  SCD's / heparin. CBC in AM.  INFECTIOUS A:   Septic shock- from intrabd source, aspiration with HCAP P:   Continue cefepime. Off vancomycin Follow cultures, procalcitonin  ENDOCRINE A:   Hx DM.   P:   SSI. Hold preadmission lantus.  NEUROLOGIC A:   Acute encephalopathy - due to sedation. Hx right hemiplegia secondary to CVA, ? Seizures (on depakote as outpatient). P:   Sedation:  Propofol gtt / Midazolam PRN. May need versed drip Fent prn RASS goal: 0 to -1. Daily WUA. Continue preadmission depakote via IV -levels ok Hold preadmission  trazodone.  Family updated: No family at bedside 8/18  Interdisciplinary Family Meeting v Palliative Care Meeting:  Due by: 03/01/17.  The patient is critically ill with multiple organ system failure and requires high complexity decision making for assessment and support, frequent evaluation and titration of therapies, advanced monitoring, review of radiographic studies and interpretation of complex data.   Critical Care Time devoted to patient care services, exclusive of separately billable procedures, described in this note is 35 minutes.   Marshell Garfinkel MD [facility] Pager [phone] If  no answer or after 3pm call: [phone] 02/25/2017, 12:24 PM

## 2017-02-25 NOTE — Progress Notes (Signed)
[facility]:  [phone] General Surgery Progress Note   LOS: 3 days  POD -     Chief Complaint: Acute respiratory failure  Assessment and Plan: 1.  Ileus  Also followed by [facility]  No acute surgical issues  Discussed with the nurses about advancing his NGT. He has a large HH, probably 60% of his stomach is above the diaphragm.  2.  Acute respiratory failure - PNA  Cefepime  WBC is better  3.  DVT prophylaxis - SQ Heparin 4.  History of right hemiparesis after CVA 5.  DM 6.  Significant HH   Active Problems:   Respiratory failure ([place])   Ileus ([place])   Encounter for central line placement  Subjective:  Intubated  Objective:   Vitals:   02/25/17 0419 02/25/17 0500  BP:  (!) 125/45  Pulse: (!) 50 (!) 52  Resp: 14 15  Temp:    SpO2: 100% 100%     Intake/Output from previous day:  08/17 0701 - 08/18 0700 In: 2771 [I.V.:1816; IV Piggyback:955] Out: 0962 [Urine:940; Stool:250]  Intake/Output this shift:  No intake/output data recorded.   Physical Exam:   General: Intubated older AA M.  He is not responsive.   HEENT: Normal. Pupils equal. .   Lungs: Some bilateral rhonchi   Abdomen: Distended.  Few BS.   Lab Results:    Recent Labs  02/24/17 0320 02/25/17 0312  WBC 10.1 6.7  HGB 10.9* 10.8*  HCT 32.5* 32.8*  PLT 144* 121*    BMET   Recent Labs  02/24/17 1631 02/25/17 0440  NA 142 140  K 3.5 3.5  CL 111 110  CO2 24 25  GLUCOSE 132* 84  BUN 21* 15  CREATININE 0.99 0.92  CALCIUM 7.6* 7.5*    PT/INR   Recent Labs  02/22/17 1355  LABPROT 14.2  INR 1.10    ABG   Recent Labs  02/22/17 1150 02/23/17 0350  PHART 7.296* 7.367  HCO3 21.8 22.1     Studies/Results:  Dg Chest Port 1 View  Result Date: 02/25/2017 CLINICAL DATA:  Acute respiratory failure EXAM: PORTABLE CHEST 1 VIEW COMPARISON:  Yesterday FINDINGS: Endotracheal tube tip at the clavicular heads. Trachea is deviated chronically, likely due to the  aorta. Left IJ central line with tip at the upper SVC. There is a NG tube which could not be advanced through the hiatal hernia. The tube is shorter than on 02/22/2017 fluoroscopy. Bibasal streaky opacity. Low lung volumes. Stable heart size. No effusion or pneumothorax. Known gaseous distention of colon. IMPRESSION: 1. Unchanged positioning of tubes and central line. Advancement of the NG tube by 5 cm would place the tip closer to the hiatal hernia. 2. Unchanged atelectasis or pneumonia at the bases. Electronically Signed   By: Monte Fantasia M.D.   On: 02/25/2017 07:05   Dg Chest Port 1 View  Result Date: 02/24/2017 CLINICAL DATA:  Respiratory failure. EXAM: PORTABLE CHEST 1 VIEW COMPARISON:  Chest x-ray and abdominal series 816 2018. FINDINGS: Endotracheal tube, NG tube, left IJ line stable position. Heart size normal. Bibasilar atelectasis. No pleural effusion or pneumothorax. Distended loops of bowel again noted IMPRESSION: 1.  Lines and tubes in stable position. 2.  Bibasilar atelectasis and/or infiltrates. 3.  Bowel distention again noted. Electronically Signed   By: Marcello Moores  Register   On: 02/24/2017 06:56   Dg Abd Portable 1v  Result Date: 02/23/2017 CLINICAL DATA:  Abdominal distension EXAM: PORTABLE ABDOMEN - 1 VIEW  COMPARISON:  Abdomen film of 02/22/2017 and CT abdomen pelvis of the same day FINDINGS: There is considerable gaseous distention of large and small bowel. No definite distal colonic bowel gas is seen and a distal colonic lesion cannot be excluded. IMPRESSION: Little change in diffuse gaseous distention of large and small bowel. Cannot excluded a distal colonic lesion. Electronically Signed   By: Ivar Drape M.D.   On: 02/23/2017 10:08     Anti-infectives:   Anti-infectives    Start     Dose/Rate Route Frequency Ordered Stop   02/24/17 0200  vancomycin (VANCOCIN) IVPB 1000 mg/200 mL premix  Status:  Discontinued     1,000 mg 200 mL/hr over 60 Minutes Intravenous Every 48 hours  02/22/17 1051 02/23/17 0848   02/22/17 1200  vancomycin (VANCOCIN) 50 mg/mL oral solution 500 mg  Status:  Discontinued     500 mg Oral Every 6 hours 02/22/17 1018 02/22/17 1019   02/22/17 1200  vancomycin (VANCOCIN) 50 mg/mL oral solution 500 mg  Status:  Discontinued     500 mg Per Tube Every 6 hours 02/22/17 1019 02/22/17 2227   02/22/17 1200  ceFEPIme (MAXIPIME) 2 g in dextrose 5 % 50 mL IVPB     2 g 100 mL/hr over 30 Minutes Intravenous Every 24 hours 02/22/17 1052     02/22/17 1030  metroNIDAZOLE (FLAGYL) IVPB 500 mg  Status:  Discontinued     500 mg 100 mL/hr over 60 Minutes Intravenous Every 8 hours 02/22/17 1014 02/23/17 0848   02/22/17 0300  vancomycin (VANCOCIN) IVPB 1000 mg/200 mL premix     1,000 mg 200 mL/hr over 60 Minutes Intravenous  Once 02/22/17 0257 02/22/17 0412   02/22/17 0300  piperacillin-tazobactam (ZOSYN) IVPB 3.375 g     3.375 g 100 mL/hr over 30 Minutes Intravenous  Once 02/22/17 0257 02/22/17 0345      Alphonsa Overall, MD, FACS Pager: [facility]: [phone] 02/25/2017

## 2017-02-25 NOTE — Progress Notes (Signed)
[facility] NOTE   Pharmacy Consult for TPN Indication: prolonged ileus  Patient Measurements: Height: 5\' 9"  (175.3 cm) Weight: 206 lb 5.6 oz (93.6 kg) IBW/kg (Calculated) : 70.7  AdjBW: 76kg   Body mass index is 30.47 kg/m.  Insulin Requirements: none in past 24hrs  Current Nutrition: NPO  IVF: LR at 21ml/hr  Central access: CVC triple lumen placed 8/15 TPN start date: 8/18  ASSESSMENT                                                                                                          HPI: 83 yo M admitted from NH on 02/22/17 with ileus and difficulty breathing.  He remains intubated.  He is not a candidate for tube feedings due to hiatal hernia/risk of aspiration.   He is having bowel sounds/loose stools & should be able to tolerate oral diet once extubated per Dr Elsworth Soho.   Pharmacy consulted to start TPN while patient is NPO.   Significant events:   Today:   Glucose - CBGs at goal (<150) on ICU hyperglycemia protocol.  Hx DM on Lantus PTA.    Electrolytes -   Potassium at low end of goal - MD repleting  Phosphorus LOW - KPhos ordered this AM    Magnesium had been elevated, now improved to wnl  Ca low at 7.5, corrects to normal 8.86 with albumin 2.3  Na WNL  Renal - Scr WNL, improving.  24h I/O +1581  LFTs - WNL   TGs - 122 (8/15), on propofol infusion  Prealbumin - 9.3 today  NUTRITIONAL GOALS                                                                                             RD recs: KCAL: 1714kcal/day PROTEIN: 98-115g/day Fluid:  >1.7L/day Clinimix 5/15 at a goal rate of 83 ml/hr + 20% fat emulsion at 20 ml/hr x12h to provide: 100g/day protein, 1894Kcal/day.  Without lipids it will provide 1414 Kcal/day.  PLAN  At 1800 today:  Start Clinimix E 5/15 at 30 ml/hr - initiate slowly since  at risk for refeeding syndrome  HOLD lipids x 7 days in critically ill patient per ASPEN guidelines  Plan to advance as tolerated to the goal rate.  TPN to contain standard multivitamins and trace elements.  Reduce IVF to 68ml/hr.  Continue SSI .   TPN lab panels on Mondays & Thursdays.  F/u daily.  Adjust cefepime dose to 1g IV q8h for improved renal function  Peggyann Juba, PharmD, BCPS Pager: [phone] 02/25/2017,12:53 PM

## 2017-02-25 NOTE — Progress Notes (Signed)
Pt HR got as low as 39, currently 49, has been in the 40s for the last 30 mins. Diprivan paused, MD notified. Will continue to monitor.

## 2017-02-26 ENCOUNTER — Inpatient Hospital Stay (HOSPITAL_COMMUNITY): Payer: Medicare Other

## 2017-02-26 LAB — CBC
HEMATOCRIT: 37.5 % — AB (ref 39.0–52.0)
Hemoglobin: 12.3 g/dL — ABNORMAL LOW (ref 13.0–17.0)
MCH: 26.6 pg (ref 26.0–34.0)
MCHC: 32.8 g/dL (ref 30.0–36.0)
MCV: 81 fL (ref 78.0–100.0)
PLATELETS: 132 10*3/uL — AB (ref 150–400)
RBC: 4.63 MIL/uL (ref 4.22–5.81)
RDW: 14.2 % (ref 11.5–15.5)
WBC: 6.5 10*3/uL (ref 4.0–10.5)

## 2017-02-26 LAB — GLUCOSE, CAPILLARY
GLUCOSE-CAPILLARY: 97 mg/dL (ref 65–99)
GLUCOSE-CAPILLARY: 119 mg/dL — AB (ref 65–99)
GLUCOSE-CAPILLARY: 91 mg/dL (ref 65–99)
GLUCOSE-CAPILLARY: 109 mg/dL — AB (ref 65–99)
Glucose-Capillary: 148 mg/dL — ABNORMAL HIGH (ref 65–99)
Glucose-Capillary: 127 mg/dL — ABNORMAL HIGH (ref 65–99)
Glucose-Capillary: 131 mg/dL — ABNORMAL HIGH (ref 65–99)

## 2017-02-26 LAB — BLOOD CULTURE ID PANEL (REFLEXED)
Acinetobacter baumannii: NOT DETECTED
CANDIDA ALBICANS: NOT DETECTED
CANDIDA TROPICALIS: NOT DETECTED
Candida glabrata: NOT DETECTED
Candida krusei: NOT DETECTED
Candida parapsilosis: NOT DETECTED
Carbapenem resistance: NOT DETECTED
ENTEROBACTER CLOACAE COMPLEX: NOT DETECTED
ENTEROBACTERIACEAE SPECIES: NOT DETECTED
ENTEROCOCCUS SPECIES: NOT DETECTED
Escherichia coli: NOT DETECTED
HAEMOPHILUS INFLUENZAE: NOT DETECTED
Klebsiella oxytoca: NOT DETECTED
Klebsiella pneumoniae: NOT DETECTED
Listeria monocytogenes: NOT DETECTED
METHICILLIN RESISTANCE: NOT DETECTED
Neisseria meningitidis: NOT DETECTED
PROTEUS SPECIES: NOT DETECTED
PSEUDOMONAS AERUGINOSA: NOT DETECTED
STAPHYLOCOCCUS SPECIES: NOT DETECTED
STREPTOCOCCUS PNEUMONIAE: NOT DETECTED
STREPTOCOCCUS PYOGENES: NOT DETECTED
Serratia marcescens: NOT DETECTED
Staphylococcus aureus (BCID): NOT DETECTED
Streptococcus agalactiae: NOT DETECTED
Streptococcus species: NOT DETECTED
VANCOMYCIN RESISTANCE: NOT DETECTED

## 2017-02-26 LAB — COMPREHENSIVE METABOLIC PANEL
ALBUMIN: 2.5 g/dL — AB (ref 3.5–5.0)
ALK PHOS: 42 U/L (ref 38–126)
ALT: 18 U/L (ref 17–63)
ANION GAP: 5 (ref 5–15)
AST: 26 U/L (ref 15–41)
BILIRUBIN TOTAL: 0.5 mg/dL (ref 0.3–1.2)
BUN: 10 mg/dL (ref 6–20)
CALCIUM: 8 mg/dL — AB (ref 8.9–10.3)
CO2: 25 mmol/L (ref 22–32)
CREATININE: 0.84 mg/dL (ref 0.61–1.24)
Chloride: 112 mmol/L — ABNORMAL HIGH (ref 101–111)
GFR calc Af Amer: >60 mL/min (ref >60)
GFR calc non Af Amer: >60 mL/min (ref >60)
GLUCOSE: 130 mg/dL — AB (ref 65–99)
Potassium: 3.8 mmol/L (ref 3.5–5.1)
Sodium: 142 mmol/L (ref 135–145)
TOTAL PROTEIN: 5.8 g/dL — AB (ref 6.5–8.1)

## 2017-02-26 LAB — PHOSPHORUS: Phosphorus: 1.8 mg/dL — ABNORMAL LOW (ref 2.5–4.6)

## 2017-02-26 LAB — MAGNESIUM: Magnesium: 2.3 mg/dL (ref 1.7–2.4)

## 2017-02-26 MED ORDER — TRACE MINERALS CR-CU-MN-SE-ZN 10-1000-500-60 MCG/ML IV SOLN
INTRAVENOUS | Status: AC
  Administered 2017-02-26: 17:00:00 via INTRAVENOUS
  Filled 2017-02-26 (×2): qty 960

## 2017-02-26 MED ORDER — POTASSIUM PHOSPHATES 15 MMOLE/5ML IV SOLN
30.0000 mmol | Freq: Once | INTRAVENOUS | Status: AC
  Administered 2017-02-26: 30 mmol via INTRAVENOUS
  Filled 2017-02-26: qty 10

## 2017-02-26 MED ORDER — LACTATED RINGERS IV SOLN
INTRAVENOUS | Status: DC
  Administered 2017-02-26: 19:00:00 via INTRAVENOUS

## 2017-02-26 NOTE — Progress Notes (Signed)
[facility]:  [phone] General Surgery Progress Note   LOS: 4 days  POD -     Chief Complaint: Acute respiratory failure  Assessment and Plan: 1.  Ileus  Also followed by [facility]  No acute surgical issues  NGT in distal esophagus - can be advanced 4-6"  He is having thin watery BM's.  Cont NGT - ? Plans to extubate tomorrow.  If extubation is further off, could try trickle feeds.  2.  Acute respiratory failure - PNA  Cefepime  WBC - 6,500 - 02/26/2017  CXR - 02/26/2017 - bibasilar infiltrates  3.  DVT prophylaxis - SQ Heparin 4.  History of right hemiparesis after CVA 5.  DM 6.  Significant HH   Active Problems:   Respiratory failure ([place])   Ileus ([place])   Encounter for central line placement  Subjective:  Intubated.  Sedated.  Hands in mittens.  Does not respond.  Objective:   Vitals:   02/26/17 0357 02/26/17 0424  BP:    Pulse: 78   Resp: (!) 24   Temp:  98 F (36.7 C)  SpO2: 100%      Intake/Output from previous day:  08/18 0701 - 08/19 0700 In: 2309.1 [I.V.:1799.1; IV Piggyback:510] Out: 2185 [Urine:1985; Stool:200]  Intake/Output this shift:  No intake/output data recorded.   Physical Exam:   General: Intubated older AA M.  He is not responsive.   HEENT: Normal. Pupils equal. .   Lungs: Some bilateral rhonchi   Abdomen: Distended. No localized tenderness. Few BS.   Lab Results:     Recent Labs  02/25/17 0312 02/26/17 0412  WBC 6.7 6.5  HGB 10.8* 12.3*  HCT 32.8* 37.5*  PLT 121* 132*    BMET    Recent Labs  02/25/17 0440 02/26/17 0412  NA 140 142  K 3.5 3.8  CL 110 112*  CO2 25 25  GLUCOSE 84 130*  BUN 15 10  CREATININE 0.92 0.84  CALCIUM 7.5* 8.0*    PT/INR  No results for input(s): LABPROT, INR in the last 72 hours.  ABG  No results for input(s): PHART, HCO3 in the last 72 hours.  Invalid input(s): PCO2, PO2   Studies/Results:  Dg Chest Port 1 View  Result Date: 02/25/2017 CLINICAL DATA:   Acute respiratory failure EXAM: PORTABLE CHEST 1 VIEW COMPARISON:  Yesterday FINDINGS: Endotracheal tube tip at the clavicular heads. Trachea is deviated chronically, likely due to the aorta. Left IJ central line with tip at the upper SVC. There is a NG tube which could not be advanced through the hiatal hernia. The tube is shorter than on 02/22/2017 fluoroscopy. Bibasal streaky opacity. Low lung volumes. Stable heart size. No effusion or pneumothorax. Known gaseous distention of colon. IMPRESSION: 1. Unchanged positioning of tubes and central line. Advancement of the NG tube by 5 cm would place the tip closer to the hiatal hernia. 2. Unchanged atelectasis or pneumonia at the bases. Electronically Signed   By: Monte Fantasia M.D.   On: 02/25/2017 07:05     Anti-infectives:   Anti-infectives    Start     Dose/Rate Route Frequency Ordered Stop   02/25/17 2000  ceFEPIme (MAXIPIME) 1 g in dextrose 5 % 50 mL IVPB     1 g 100 mL/hr over 30 Minutes Intravenous Every 8 hours 02/25/17 1252     02/24/17 0200  vancomycin (VANCOCIN) IVPB 1000 mg/200 mL premix  Status:  Discontinued     1,000 mg 200  mL/hr over 60 Minutes Intravenous Every 48 hours 02/22/17 1051 02/23/17 0848   02/22/17 1200  vancomycin (VANCOCIN) 50 mg/mL oral solution 500 mg  Status:  Discontinued     500 mg Oral Every 6 hours 02/22/17 1018 02/22/17 1019   02/22/17 1200  vancomycin (VANCOCIN) 50 mg/mL oral solution 500 mg  Status:  Discontinued     500 mg Per Tube Every 6 hours 02/22/17 1019 02/22/17 2227   02/22/17 1200  ceFEPIme (MAXIPIME) 2 g in dextrose 5 % 50 mL IVPB  Status:  Discontinued     2 g 100 mL/hr over 30 Minutes Intravenous Every 24 hours 02/22/17 1052 02/25/17 1253   02/22/17 1030  metroNIDAZOLE (FLAGYL) IVPB 500 mg  Status:  Discontinued     500 mg 100 mL/hr over 60 Minutes Intravenous Every 8 hours 02/22/17 1014 02/23/17 0848   02/22/17 0300  vancomycin (VANCOCIN) IVPB 1000 mg/200 mL premix     1,000 mg 200 mL/hr  over 60 Minutes Intravenous  Once 02/22/17 0257 02/22/17 0412   02/22/17 0300  piperacillin-tazobactam (ZOSYN) IVPB 3.375 g     3.375 g 100 mL/hr over 30 Minutes Intravenous  Once 02/22/17 0257 02/22/17 0345      Alphonsa Overall, MD, FACS Pager: [facility] Office: [phone] 02/26/2017

## 2017-02-26 NOTE — Progress Notes (Signed)
PULMONARY / CRITICAL CARE MEDICINE   Name: Derek Crawford MRN: [id number] DOB: [birth year]    ADMISSION DATE:  02/22/2017 CONSULTATION DATE:  02/22/17  REFERRING MD:  Randal Buba  CHIEF COMPLAINT:  Hypoxia  BRIEF DESCRIPTION:  83 y.o. 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  Required intubation after arrival to ICU.  STUDIES:  CT A / P 8/14 > no free air.  Diffuse ileus.  Suspected cholelithiasis. Large hiatal hernia. Mild renal atrophy.  Patchy bilateral airspace opacities.  CULTURES: Blood 8/14 > ng Sputum 8/14 >  c diff neg  ANTIBIOTICS: Vanc 8/14 > 8/16 Zosyn 8/14 > 8/15 Cefepime 8/15 >>  SIGNIFICANT EVENTS: 8/14 > admitted.  Started on BiPAP but failed and required intubation.  CCS consulted for ileus. 8/15 NG placed by IR  LINES/TUBES: ETT 8/14 >  LIJ 8/15 >>  SUBJECTIVE:  Remains intubated Weaning on PSV but too sedated to try extubation  VITAL SIGNS: BP (!) 152/49   Pulse (!) 55   Temp 97.9 F (36.6 C) (Oral)   Resp 18   Ht 5\' 9"  (1.753 m)   Wt 95 kg (209 lb 7 oz)   SpO2 100%   BMI 30.93 kg/m   HEMODYNAMICS:    VENTILATOR SETTINGS: Vent Mode: PSV;CPAP FiO2 (%):  [30 %-40 %] 30 % Set Rate:  [14 bmp] 14 bmp Vt Set:  [570 mL] 570 mL PEEP:  [5 cmH20] 5 cmH20 Delta P (Amplitude):  [14] 14 Pressure Support:  [5 cmH20] 5 cmH20 Plateau Pressure:  [19 cmH20-23 cmH20] 20 cmH20  INTAKE / OUTPUT: I/O last 3 completed shifts: In: 3509.6 [I.V.:2694.6; IV Piggyback:815] Out: 2530 [Urine:2330; Stool:200]  PHYSICAL EXAMINATION: Gen:      No acute distress HEENT:  EOMI, sclera anicteric, ETT in place Neck:     No masses; no thyromegaly Lungs:    Clear to auscultation bilaterally; normal respiratory effort CV:         Regular rate and rhythm; no murmurs Abd:      Distended, no guarding, rigidity Ext:    No edema; adequate peripheral perfusion Skin:      Warm and dry; no  rash Neuro: Sedated, unresponsive LABS:  BMET  Recent Labs Lab 02/24/17 1631 02/25/17 0440 02/26/17 0412  NA 142 140 142  K 3.5 3.5 3.8  CL 111 110 112*  CO2 24 25 25   BUN 21* 15 10  CREATININE 0.99 0.92 0.84  GLUCOSE 132* 84 130*    Electrolytes  Recent Labs Lab 02/24/17 0320 02/24/17 1631 02/25/17 0440 02/26/17 0412  CALCIUM 7.1* 7.6* 7.5* 8.0*  MG 2.8*  --  2.4 2.3  PHOS 1.7*  --  2.1* 1.8*    CBC  Recent Labs Lab 02/24/17 0320 02/25/17 0312 02/26/17 0412  WBC 10.1 6.7 6.5  HGB 10.9* 10.8* 12.3*  HCT 32.5* 32.8* 37.5*  PLT 144* 121* 132*    Coag's  Recent Labs Lab 02/22/17 0226 02/22/17 1355  INR 1.00 1.10    Sepsis Markers  Recent Labs Lab 02/22/17 0254 02/22/17 0711 02/22/17 1355 02/23/17 0314 02/24/17 0320  LATICACIDVEN 5.35* 4.27* 2.5*  --   --   PROCALCITON  --   --  1.54 1.14 0.63    ABG  Recent Labs Lab 02/22/17 0524 02/22/17 1150 02/23/17 0350  PHART 7.275* 7.296* 7.367  PCO2ART 39.6 46.2 39.1  PO2ART 116* 225* 98.7    Liver Enzymes  Recent Labs Lab 02/22/17 0226 02/25/17 0440 02/26/17 0412  AST 27 23 26   ALT 14* 15* 18  ALKPHOS 72 38 42  BILITOT 0.4 0.7 0.5  ALBUMIN 4.5 2.3* 2.5*    Cardiac Enzymes  Recent Labs Lab 02/22/17 2145 02/23/17 0314 02/23/17 0816  TROPONINI 0.06* 0.05* 0.04*    Glucose  Recent Labs Lab 02/25/17 2019 02/25/17 2322 02/26/17 0333 02/26/17 0743 02/26/17 1125 02/26/17 1224  GLUCAP 82 106* 97 119* 148* 127*    Imaging Dg Chest Port 1 View  Result Date: 02/26/2017 CLINICAL DATA:  Acute respiratory failure. EXAM: PORTABLE CHEST 1 VIEW COMPARISON:  February 25, 2017 FINDINGS: The ETT terminates 4 cm below the thoracic inlet. The NG tube terminates in the mid esophagus. Placement of the NG tube has been stable since February 23, 2017. No pneumothorax. Bibasilar opacities are stable. Probable small effusion on the right. No change in the cardiomediastinal silhouette. The  left central line is stable. IMPRESSION: 1. The NG tube continues to terminate in the midesophagus. The patient may benefit from advancement. Recommend clinical correlation. 2. Other support apparatus is stable. 3. Bibasilar opacities are similar in the interval. Small right effusion. Electronically Signed   By: Dorise Bullion III M.D   On: 02/26/2017 07:13   Reviewed    DISCUSSION: 83 y.o. male from nursing home, admitted with dyspnea and ileus.  Required intubation after arrival to ED. Surgery, GI consulted for ileus.  ASSESSMENT / PLAN:  PULMONARY A: Acute hypoxic respiratory failure  Aspiration pneumonia P:   Continue daily SBTs Will try extubation when MS is better.  Wean sedation.  CARDIOVASCULAR A:  Hx HTN, HLD. Septic shock, lactate cleared Demand ischemia P:  Unable to give ASA due to flexiseal. Hold preadmission amlodipine, atorvastatin, furosemide, lisinopril.  RENAL A:   AKI - sepsis + ACEi. AGMA - lactate. Severe hypokalemia /hypophos P:   Holding ACEI. Replete K. Keep levels above 4 BMP in AM.  GASTROINTESTINAL A:   Colonic ileus with diarrhea Large hiatal hernia Possible cholelithiasis.  P:   CCS & GI following -improving with reglan Ct  OGT to LIMS - do not manipulate , is in stomach although appears in esophagus on CXR. Started on  TNA. SUP: famotidine. NPO.  HEMATOLOGIC A:   VTE Prophylaxis. P:  SCD's / heparin. CBC in AM.  INFECTIOUS A:   Septic shock- from intrabd source, aspiration with HCAP P:   Continue cefepime. Off vancomycin Follow cultures, procalcitonin  ENDOCRINE A:   Hx DM.   P:   SSI. Hold preadmission lantus.  NEUROLOGIC A:   Acute encephalopathy - due to sedation. Hx right hemiplegia secondary to CVA, ? Seizures (on depakote as outpatient). P:   Sedation:  Propofol gtt / Midazolam gtt Fent prn Wean down sedation RASS goal: 0 to -1. Daily WUA. Continue preadmission depakote via IV -levels ok Hold  preadmission trazodone.  Family updated: No family at bedside 8/19  Interdisciplinary Family Meeting v Palliative Care Meeting:  Due by: 03/01/17.  The patient is critically ill with multiple organ system failure and requires high complexity decision making for assessment and support, frequent evaluation and titration of therapies, advanced monitoring, review of radiographic studies and interpretation of complex data.   Critical Care Time devoted to patient care services, exclusive of separately billable procedures, described in this note is 35 minutes.   Marshell Garfinkel MD [facility] Pager [phone] If no answer or after 3pm call: [phone] 02/26/2017,  3:14 PM

## 2017-02-26 NOTE — Progress Notes (Addendum)
[facility] NOTE   Pharmacy Consult for TPN Indication: prolonged ileus  Patient Measurements: Height: 5\' 9"  (175.3 cm) Weight: 209 lb 7 oz (95 kg) IBW/kg (Calculated) : 70.7  AdjBW: 76kg   Body mass index is 30.93 kg/m.  Insulin Requirements: none in past 24hrs  Current Nutrition: NPO  IVF: LR at 37ml/hr  Central access: CVC triple lumen placed 8/15 TPN start date: 8/18  ASSESSMENT                                                                                                          HPI: 83 yo M admitted from NH on 02/22/17 with ileus and difficulty breathing.  He remains intubated.  He is not a candidate for tube feedings due to hiatal hernia/risk of aspiration.   He is having bowel sounds/loose stools & should be able to tolerate oral diet once extubated per Dr Elsworth Soho.   Pharmacy consulted to start TPN while patient is NPO.   Significant events:   Today:   Glucose - CBGs at goal (<150) on ICU hyperglycemia protocol.  Hx DM on Lantus PTA.    Electrolytes -   Potassium wnl after KPhos and 6 runs KCl yesterday  Phosphorus LOW despite 50mmol KPhos yesterday - KPhos 79mmol ordered this AM    Magnesium had been elevated, now improved to wnl  Ca low at 8.0, corrects to normal 9.2 with albumin 2.5  Na WNL  Renal - Scr WNL, UOP 1985/24h.  24h I/O +124  LFTs - WNL   TGs - 122 (8/15), 97 and 103 (8/18)  Prealbumin - 9.3 (9/18)  NUTRITIONAL GOALS                                                                                             RD recs: KCAL: 1714kcal/day PROTEIN: 98-115g/day Fluid:  >1.7L/day Clinimix 5/15 at a goal rate of 83 ml/hr + 20% fat emulsion at 20 ml/hr x12h to provide: 100g/day protein, 1894Kcal/day.  Without lipids it will provide 1414 Kcal/day.  PLAN  Now: Potassium phosphate 59mmol IV x  1  At 1800 today:  Increase Clinimix E 5/15 to 40 ml/hr - increase slowly since at risk for refeeding syndrome  HOLD lipids x 7 days in critically ill patient per ASPEN guidelines  Plan to advance as tolerated to the goal rate.  TPN to contain standard multivitamins and trace elements.  Reduce IVF to 67ml/hr.  Continue SSI .   TPN lab panels on Mondays & Thursdays.  F/u daily.  Per CCS note, could try trickle feeds  Peggyann Juba, PharmD, BCPS Pager: [phone] 02/26/2017,8:10 AM

## 2017-02-26 NOTE — Progress Notes (Signed)
PHARMACY - PHYSICIAN COMMUNICATION CRITICAL VALUE ALERT - BLOOD CULTURE IDENTIFICATION (BCID)  Results for orders placed or performed during the hospital encounter of 02/22/17  Blood Culture ID Panel (Reflexed) (Collected: 02/22/2017  2:47 AM)  Result Value Ref Range   Enterococcus species NOT DETECTED NOT DETECTED   Vancomycin resistance NOT DETECTED NOT DETECTED   Listeria monocytogenes NOT DETECTED NOT DETECTED   Staphylococcus species NOT DETECTED NOT DETECTED   Staphylococcus aureus NOT DETECTED NOT DETECTED   Methicillin resistance NOT DETECTED NOT DETECTED   Streptococcus species NOT DETECTED NOT DETECTED   Streptococcus agalactiae NOT DETECTED NOT DETECTED   Streptococcus pneumoniae NOT DETECTED NOT DETECTED   Streptococcus pyogenes NOT DETECTED NOT DETECTED   Acinetobacter baumannii NOT DETECTED NOT DETECTED   Enterobacteriaceae species NOT DETECTED NOT DETECTED   Enterobacter cloacae complex NOT DETECTED NOT DETECTED   Escherichia coli NOT DETECTED NOT DETECTED   Klebsiella oxytoca NOT DETECTED NOT DETECTED   Klebsiella pneumoniae NOT DETECTED NOT DETECTED   Proteus species NOT DETECTED NOT DETECTED   Serratia marcescens NOT DETECTED NOT DETECTED   Carbapenem resistance NOT DETECTED NOT DETECTED   Haemophilus influenzae NOT DETECTED NOT DETECTED   Neisseria meningitidis NOT DETECTED NOT DETECTED   Pseudomonas aeruginosa NOT DETECTED NOT DETECTED   Candida albicans NOT DETECTED NOT DETECTED   Candida glabrata NOT DETECTED NOT DETECTED   Candida krusei NOT DETECTED NOT DETECTED   Candida parapsilosis NOT DETECTED NOT DETECTED   Candida tropicalis NOT DETECTED NOT DETECTED  Blood cx from 8/15: 1/4 with a GPR, unknown  Name of physician (or Provider) Contacted: Mauri Brooklyn  Changes to prescribed antibiotics required: none  Minda Ditto 02/26/2017  6:46 PM

## 2017-02-27 ENCOUNTER — Inpatient Hospital Stay (HOSPITAL_COMMUNITY): Payer: Medicare Other

## 2017-02-27 LAB — COMPREHENSIVE METABOLIC PANEL
ALBUMIN: 2.2 g/dL — AB (ref 3.5–5.0)
ALT: 17 U/L (ref 17–63)
AST: 22 U/L (ref 15–41)
Alkaline Phosphatase: 37 U/L — ABNORMAL LOW (ref 38–126)
Anion gap: 6 (ref 5–15)
BUN: 10 mg/dL (ref 6–20)
CHLORIDE: 109 mmol/L (ref 101–111)
CO2: 25 mmol/L (ref 22–32)
Calcium: 7.8 mg/dL — ABNORMAL LOW (ref 8.9–10.3)
Creatinine, Ser: 0.85 mg/dL (ref 0.61–1.24)
GFR calc Af Amer: >60 mL/min (ref >60)
GFR calc non Af Amer: >60 mL/min (ref >60)
GLUCOSE: 131 mg/dL — AB (ref 65–99)
POTASSIUM: 3.1 mmol/L — AB (ref 3.5–5.1)
Sodium: 140 mmol/L (ref 135–145)
Total Bilirubin: 0.6 mg/dL (ref 0.3–1.2)
Total Protein: 5.2 g/dL — ABNORMAL LOW (ref 6.5–8.1)

## 2017-02-27 LAB — PREALBUMIN: PREALBUMIN: 9.6 mg/dL — AB (ref 18–38)

## 2017-02-27 LAB — DIFFERENTIAL
Basophils Absolute: 0 10*3/uL (ref 0.0–0.1)
Basophils Relative: 0 %
EOS PCT: 4 %
Eosinophils Absolute: 0.2 10*3/uL (ref 0.0–0.7)
LYMPHS ABS: 1.1 10*3/uL (ref 0.7–4.0)
LYMPHS PCT: 16 %
MONO ABS: 0.9 10*3/uL (ref 0.1–1.0)
Monocytes Relative: 13 %
NEUTROS PCT: 67 %
Neutro Abs: 4.6 10*3/uL (ref 1.7–7.7)

## 2017-02-27 LAB — GLUCOSE, CAPILLARY
GLUCOSE-CAPILLARY: 110 mg/dL — AB (ref 65–99)
GLUCOSE-CAPILLARY: 116 mg/dL — AB (ref 65–99)
Glucose-Capillary: 126 mg/dL — ABNORMAL HIGH (ref 65–99)
Glucose-Capillary: 127 mg/dL — ABNORMAL HIGH (ref 65–99)
Glucose-Capillary: 155 mg/dL — ABNORMAL HIGH (ref 65–99)
Glucose-Capillary: 156 mg/dL — ABNORMAL HIGH (ref 65–99)

## 2017-02-27 LAB — CBC
HEMATOCRIT: 33.9 % — AB (ref 39.0–52.0)
HEMOGLOBIN: 11.1 g/dL — AB (ref 13.0–17.0)
MCH: 25.9 pg — AB (ref 26.0–34.0)
MCHC: 32.7 g/dL (ref 30.0–36.0)
MCV: 79.2 fL (ref 78.0–100.0)
Platelets: 132 10*3/uL — ABNORMAL LOW (ref 150–400)
RBC: 4.28 MIL/uL (ref 4.22–5.81)
RDW: 13.9 % (ref 11.5–15.5)
WBC: 6.8 10*3/uL (ref 4.0–10.5)

## 2017-02-27 LAB — CULTURE, BLOOD (ROUTINE X 2): Culture: NO GROWTH

## 2017-02-27 LAB — MAGNESIUM: Magnesium: 1.7 mg/dL (ref 1.7–2.4)

## 2017-02-27 LAB — TRIGLYCERIDES: Triglycerides: 122 mg/dL (ref <150)

## 2017-02-27 LAB — PHOSPHORUS: Phosphorus: 2.1 mg/dL — ABNORMAL LOW (ref 2.5–4.6)

## 2017-02-27 MED ORDER — FENTANYL CITRATE (PF) 100 MCG/2ML IJ SOLN
25.0000 ug | INTRAMUSCULAR | Status: DC | PRN
  Administered 2017-02-28: 25 ug via INTRAVENOUS
  Filled 2017-02-27: qty 2

## 2017-02-27 MED ORDER — LATANOPROST 0.005 % OP SOLN
1.0000 [drp] | Freq: Every day | OPHTHALMIC | Status: DC
  Administered 2017-02-27 – 2017-03-13 (×15): 1 [drp] via OPHTHALMIC
  Filled 2017-02-27 (×2): qty 2.5

## 2017-02-27 MED ORDER — POTASSIUM PHOSPHATES 15 MMOLE/5ML IV SOLN
20.0000 mmol | Freq: Once | INTRAVENOUS | Status: AC
  Administered 2017-02-27: 20 mmol via INTRAVENOUS
  Filled 2017-02-27: qty 6.67

## 2017-02-27 MED ORDER — MAGNESIUM SULFATE 2 GM/50ML IV SOLN
2.0000 g | Freq: Once | INTRAVENOUS | Status: AC
  Administered 2017-02-27: 2 g via INTRAVENOUS
  Filled 2017-02-27: qty 50

## 2017-02-27 MED ORDER — TRACE MINERALS CR-CU-MN-SE-ZN 10-1000-500-60 MCG/ML IV SOLN
INTRAVENOUS | Status: AC
  Administered 2017-02-27: 17:00:00 via INTRAVENOUS
  Filled 2017-02-27: qty 960

## 2017-02-27 MED ORDER — FUROSEMIDE 10 MG/ML IJ SOLN
40.0000 mg | Freq: Every day | INTRAMUSCULAR | Status: DC
  Administered 2017-02-27 – 2017-03-03 (×4): 40 mg via INTRAVENOUS
  Filled 2017-02-27 (×4): qty 4

## 2017-02-27 MED ORDER — POTASSIUM CHLORIDE 10 MEQ/100ML IV SOLN
10.0000 meq | INTRAVENOUS | Status: AC
  Administered 2017-02-27 (×3): 10 meq via INTRAVENOUS
  Filled 2017-02-27 (×3): qty 100

## 2017-02-27 NOTE — Progress Notes (Signed)
[facility] Progress Note  Subjective:  Just extubated, still groggy.  Flexiseal with clear mucus material, no stool contents noted.    Today's x-ray shows the following:  IMPRESSION: Persistent distended colon again noted with slight improvement. No prominent small bowel distention on today's exam. Findings consist with adynamic ileus. Distal colonic obstruction including sigmoid volvulus cannot be excluded . Continued follow-up suggested to demonstrate resolution.  Objective:  Vital signs in last 24 hours: Temp:  [97.5 F (36.4 C)-98.8 F (37.1 C)] 98.5 F (36.9 C) (08/20 0803) Pulse Rate:  [46-89] 60 (08/20 0600) Resp:  [14-27] 20 (08/20 0600) BP: (93-169)/(34-105) 144/66 (08/20 0600) SpO2:  [95 %-100 %] 100 % (08/20 0900) FiO2 (%):  [30 %-40 %] 40 % (08/20 0816) Weight:  [204 lb 2.3 oz (92.6 kg)] 204 lb 2.3 oz (92.6 kg) (08/20 0436) Last BM Date: 02/26/17 General:  Still groggy from extubation.  Well-developed, in NAD. Heart:  Regular rate and rhythm; no murmurs Pulm:  Crackles heard B/L.  No increased WOB. Abdomen:  Soft, still slightly distended but definitely improved.  BS present but hypoactive and difficult to hear over resonating breath sounds.  Non-tender.   Extremities:  Without edema.  Intake/Output from previous day: 08/19 0701 - 08/20 0700 In: 2103.2 [I.V.:1688.2; IV Piggyback:415] Out: 2225 [Urine:1625; Emesis/NG output:200; Stool:400]  Lab Results:  Recent Labs  02/25/17 0312 02/26/17 0412 02/27/17 0741  WBC 6.7 6.5 6.8  HGB 10.8* 12.3* 11.1*  HCT 32.8* 37.5* 33.9*  PLT 121* 132* 132*   BMET  Recent Labs  02/25/17 0440 02/26/17 0412 02/27/17 0741  NA 140 142 140  K 3.5 3.8 3.1*  CL 110 112* 109  CO2 25 25 25   GLUCOSE 84 130* 131*  BUN 15 10 10   CREATININE 0.92 0.84 0.85  CALCIUM 7.5* 8.0* 7.8*   LFT  Recent Labs  02/27/17 0741  PROT 5.2*  ALBUMIN 2.2*  AST 22  ALT 17  ALKPHOS 37*  BILITOT 0.6   Dg Chest  Port 1 View  Result Date: 02/27/2017 CLINICAL DATA:  Acute respiratory failure . EXAM: PORTABLE CHEST 1 VIEW COMPARISON:  Chest x-ray 02/26/2017 . FINDINGS: NG tube again noted, its tip appears to be at the gastroesophageal junction. Further interim advancement should be considered. Endotracheal tube and left IJ line stable position. Heart size stable . Persistent unchanged atelectasis/ infiltrates. Tiny bilateral pleural effusions cannot be excluded. No pneumothorax. Heart size stable. No interim change. IMPRESSION: 1. NG tube tip previously noted in the mid esophagus has been advanced. On today's exam the NG tube tip is at the level of the gastroesophageal junction. Further interim advancement should be considered. 2. Persistent unchanged bibasilar atelectasis/infiltrates. Tiny bilateral pleural effusions cannot be excluded . These results will be called to the ordering clinician or representative by the Radiologist Assistant, and communication documented in the PACS or zVision Dashboard. Electronically Signed   By: Marcello Moores  Register   On: 02/27/2017 07:06   Dg Chest Port 1 View  Result Date: 02/26/2017 CLINICAL DATA:  Acute respiratory failure. EXAM: PORTABLE CHEST 1 VIEW COMPARISON:  February 25, 2017 FINDINGS: The ETT terminates 4 cm below the thoracic inlet. The NG tube terminates in the mid esophagus. Placement of the NG tube has been stable since February 23, 2017. No pneumothorax. Bibasilar opacities are stable. Probable small effusion on the right. No change in the cardiomediastinal silhouette. The left central line is stable. IMPRESSION: 1. The NG tube continues to terminate in the  midesophagus. The patient may benefit from advancement. Recommend clinical correlation. 2. Other support apparatus is stable. 3. Bibasilar opacities are similar in the interval. Small right effusion. Electronically Signed   By: Dorise Bullion III M.D   On: 02/26/2017 07:13   Assessment / Plan: *Ileus secondary to PNA,  sepsis:  Rectal tube/flexiseal still in place but OG has been removed with extubation. Keep electrolytes WNL's, ideally should keep K+ 4 or greater.  Reglan 10 mg IV every 6 hours was added 8/16.  Now on TNA. *Acute hypoxic respiratory failure due to aspiration PNA:  Just extubated this AM. *AKI:  Resolved.  -Continue with present care:  Rectal tube, reglan, bowel rest (now on TNA), turning patient side to side, correcting electrolyte abnormalities. -Further plans per Dr. Ardis Hughs.   LOS: 5 days   ZEHR, JESSICA D.  02/27/2017, 9:05 AM  Pager number [phone]   ________________________________________________________________________  [facility] MD note:  I personally examined the patient, reviewed the data and agree with the assessment and plan described above. Today is the first time I have seen Mr. Rinehart. His abd is fairly soft, not distended and does not appear tender on exam. He was just extubated earlier today and is coughing quite a bit now.  Unfortunately OG tube is out since extubation.  Would not replace it just yet but if he has any vomiting it will need to be replaced (via fluoroscopy again to confirm it is actually in his stomach given the very large hiatal hernia).  Since he's now extubated hopefully he can be mobilized (turning him side to side every 1-2 hours to allow gas to be expelled).  Need to keep his electrolytes in normal range (exp K and Mag). Keep the rectal tube in place for now, reglan QID and nutrition via TNA.   Repeat KUB in AM (I have ordered).    Owens Loffler, MD [facility] Pager [phone]

## 2017-02-27 NOTE — Progress Notes (Signed)
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  Ileus - Likely 2/2 pneumonia - NG tube was unfortunately removed when patient was extubated - Patient having thin watery BMs - No acute surgical issues at this time - Exam revealed abdomen less distended and soft  FEN: NPO, IVF, TPN, cortrak possible?  VTE: SCD's, SQ heparin ID: Cefepime Foley: yes Follow up: TBD  DISPO: Extubated this a.m. Abdomen is less distended, soft and appears to be nontender.   No surgical needs at this time. We will continue to follow.  Agree with above. No acute surgical issues. DN   LOS: 5 days    Subjective:  CC: PNA  Patient is alert and will open eyes to verbal commands. Mumbling non-comprehensible words. Did not wince when I pressed on his abdomen.  Objective: Vital signs in last 24 hours: Temp:  [97.5 F (36.4 C)-98.8 F (37.1 C)] 98.5 F (36.9 C) (08/20 0803) Pulse Rate:  [55-89] 78 (08/20 1100) Resp:  [14-28] 22 (08/20 1100) BP: (93-176)/(34-105) 153/58 (08/20 1100) SpO2:  [95 %-100 %] 97 % (08/20 1100) FiO2 (%):  [30 %-40 %] 40 % (08/20 0816) Weight:  [204 lb 2.3 oz (92.6 kg)] 204 lb 2.3 oz (92.6 kg) (08/20 0436) Last BM Date: 02/26/17  Intake/Output from previous day: 08/19 0701 - 08/20 0700 In: 2338.7 [I.V.:1923.7; IV Piggyback:415] Out: 2225 [Urine:1625; Emesis/NG output:200; Stool:400] Intake/Output this shift: Total I/O In: 873.7 [I.V.:263.7; IV Piggyback:610] Out: 2250 [Urine:2050; Emesis/NG output:200]  PE: Gen:  Alert, somnolent  Card:  RRR, normal S1 and S2 Pulm:  Rate and effort normal Abd: Soft, mild distention, hypoactive BS, no hernia appreciated, does not appear  to be tender, no guarding Skin: no rashes noted, warm and dry   Anti-infectives: Anti-infectives    Start     Dose/Rate Route Frequency Ordered Stop   02/25/17 2000  ceFEPIme (MAXIPIME) 1 g in dextrose 5 % 50 mL IVPB     1 g 100 mL/hr over 30 Minutes Intravenous Every 8 hours 02/25/17 1252     02/24/17 0200  vancomycin (VANCOCIN) IVPB 1000 mg/200 mL premix  Status:  Discontinued     1,000 mg 200 mL/hr over 60 Minutes Intravenous Every 48 hours 02/22/17 1051 02/23/17 0848   02/22/17 1200  vancomycin (VANCOCIN) 50 mg/mL oral solution 500 mg  Status:  Discontinued     500 mg Oral Every 6 hours 02/22/17 1018 02/22/17 1019   02/22/17 1200  vancomycin (VANCOCIN) 50 mg/mL oral solution 500 mg  Status:  Discontinued     500 mg Per Tube Every 6 hours 02/22/17 1019 02/22/17 2227   02/22/17 1200  ceFEPIme (MAXIPIME) 2 g in dextrose 5 % 50 mL IVPB  Status:  Discontinued     2 g 100 mL/hr over 30 Minutes Intravenous Every 24 hours 02/22/17 1052 02/25/17 1253   02/22/17 1030  metroNIDAZOLE (FLAGYL) IVPB 500 mg  Status:  Discontinued     500 mg 100 mL/hr over 60 Minutes Intravenous Every 8 hours 02/22/17 1014 02/23/17 0848   02/22/17 0300  vancomycin (VANCOCIN) IVPB 1000 mg/200 mL premix     1,000 mg 200 mL/hr over 60 Minutes Intravenous  Once 02/22/17 0257 02/22/17 0412  02/22/17 0300  piperacillin-tazobactam (ZOSYN) IVPB 3.375 g     3.375 g 100 mL/hr over 30 Minutes Intravenous  Once 02/22/17 0257 02/22/17 0345      Lab Results:   Recent Labs  02/26/17 0412 02/27/17 0741  WBC 6.5 6.8  HGB 12.3* 11.1*  HCT 37.5* 33.9*  PLT 132* 132*   BMET  Recent Labs  02/26/17 0412 02/27/17 0741  NA 142 140  K 3.8 3.1*  CL 112* 109  CO2 25 25  GLUCOSE 130* 131*  BUN 10 10  CREATININE 0.84 0.85  CALCIUM 8.0* 7.8*   PT/INR No results for input(s): LABPROT, INR in the last 72 hours. CMP     Component Value Date/Time   NA 140 02/27/2017 0741   K 3.1 (L) 02/27/2017 0741   CL 109  02/27/2017 0741   CO2 25 02/27/2017 0741   GLUCOSE 131 (H) 02/27/2017 0741   BUN 10 02/27/2017 0741   CREATININE 0.85 02/27/2017 0741   CALCIUM 7.8 (L) 02/27/2017 0741   PROT 5.2 (L) 02/27/2017 0741   ALBUMIN 2.2 (L) 02/27/2017 0741   AST 22 02/27/2017 0741   ALT 17 02/27/2017 0741   ALKPHOS 37 (L) 02/27/2017 0741   BILITOT 0.6 02/27/2017 0741   GFRNONAA >60 02/27/2017 0741   GFRAA >60 02/27/2017 0741   Lipase  No results found for: LIPASE  Studies/Results: Dg Chest Port 1 View  Result Date: 02/27/2017 CLINICAL DATA:  Acute respiratory failure . EXAM: PORTABLE CHEST 1 VIEW COMPARISON:  Chest x-ray 02/26/2017 . FINDINGS: NG tube again noted, its tip appears to be at the gastroesophageal junction. Further interim advancement should be considered. Endotracheal tube and left IJ line stable position. Heart size stable . Persistent unchanged atelectasis/ infiltrates. Tiny bilateral pleural effusions cannot be excluded. No pneumothorax. Heart size stable. No interim change. IMPRESSION: 1. NG tube tip previously noted in the mid esophagus has been advanced. On today's exam the NG tube tip is at the level of the gastroesophageal junction. Further interim advancement should be considered. 2. Persistent unchanged bibasilar atelectasis/infiltrates. Tiny bilateral pleural effusions cannot be excluded . These results will be called to the ordering clinician or representative by the Radiologist Assistant, and communication documented in the PACS or zVision Dashboard. Electronically Signed   By: Marcello Moores  Register   On: 02/27/2017 07:06   Dg Chest Port 1 View  Result Date: 02/26/2017 CLINICAL DATA:  Acute respiratory failure. EXAM: PORTABLE CHEST 1 VIEW COMPARISON:  February 25, 2017 FINDINGS: The ETT terminates 4 cm below the thoracic inlet. The NG tube terminates in the mid esophagus. Placement of the NG tube has been stable since February 23, 2017. No pneumothorax. Bibasilar opacities are stable. Probable  small effusion on the right. No change in the cardiomediastinal silhouette. The left central line is stable. IMPRESSION: 1. The NG tube continues to terminate in the midesophagus. The patient may benefit from advancement. Recommend clinical correlation. 2. Other support apparatus is stable. 3. Bibasilar opacities are similar in the interval. Small right effusion. Electronically Signed   By: Dorise Bullion III M.D   On: 02/26/2017 07:13   Dg Abd Portable 1v  Result Date: 02/27/2017 CLINICAL DATA:  Distended abdomen.  Ileus. EXAM: PORTABLE ABDOMEN - 1 VIEW COMPARISON:  02/23/2017.  02/22/2017.  CT 02/22/2017. FINDINGS: Persistent distended colon again noted with slight improvement. No prominent small-bowel distention on today's examination . Findings again consistent with adynamic ileus. Distal colonic obstruction including sigmoid volvulus cannot be excluded. No free air  identified. Degenerative changes lumbar spine and both hips . IMPRESSION: Persistent distended colon again noted with slight improvement. No prominent small bowel distention on today's exam. Findings consist with adynamic ileus. Distal colonic obstruction including sigmoid volvulus cannot be excluded . Continued follow-up suggested to demonstrate resolution. Electronically Signed   By: Marcello Moores  Register   On: 02/27/2017 09:30   Kalman Drape , [facility] Surgery 02/27/2017, 11:59 AM Pager: [phone] Consults: [phone] Mon-Fri 7:00 am-4:30 pm Sat-Sun 7:00 am-11:30 am  Alphonsa Overall, MD, [facility] Surgery Pager: [phone] Office phone:  [phone]

## 2017-02-27 NOTE — Progress Notes (Signed)
Incentive spirometer placed at bedside. Patient unable to participate at this time d/t AMS.

## 2017-02-27 NOTE — Progress Notes (Signed)
[place] NOTE   Pharmacy Consult for TPN Indication: prolonged ileus  Patient Measurements: Height: 5\' 9"  (175.3 cm) Weight: 204 lb 2.3 oz (92.6 kg) IBW/kg (Calculated) : 70.7  AdjBW: 76kg   Body mass index is 30.15 kg/m.  Insulin Requirements: none in past 24hrs  Current Nutrition: NPO  IVF: LR at 10 ml/hr  Central access: CVC triple lumen placed 8/15 TPN start date: 8/18  ASSESSMENT                                                                                                          HPI: 83 yo M admitted from NH on 02/22/17 with ileus and difficulty breathing.  He remains intubated.  He is not a candidate for tube feedings due to hiatal hernia/risk of aspiration.   He is having bowel sounds/loose stools & should be able to tolerate oral diet once extubated per Dr Elsworth Soho.   Pharmacy consulted to start TPN while patient is NPO.   Significant events:   Today:   Glucose - CBGs at goal (<150) on ICU hyperglycemia protocol.  Hx DM on Lantus PTA.    Electrolytes -   Potassium low at 3.1 after 30 mmol of KPhos   Phosphorus at 2.1 low despite 66mmol KPhos yesterday -   Magnesium at 1.7   Ca low at 7.8, corrects to normal 9.2 with albumin 2.2  Na WNL  Renal - Scr WNL, UOP 1625/24h.  24h I/O -121  LFTs - WNL   TGs - 122 (8/15), 97 and 103 (8/18), 122 ( 8/20)   Prealbumin - 9.3 (8/18), 9.6 (8/20)  NUTRITIONAL GOALS                                                                                             RD recs: KCAL: 1714kcal/day PROTEIN: 98-115g/day Fluid:  >1.7L/day Clinimix 5/15 at a goal rate of 83 ml/hr + 20% fat emulsion at 20 ml/hr x12h to provide: 100g/day protein, 1894Kcal/day.  Without lipids it will provide 1414 Kcal/day.  8/20 : Per RD recs, pt at high risk of refeeding syndrome, especially with persistently low electrolytes  PLAN  Now:  Potassium phosphate 28mmol IV x 1 Potassium chloride 10 meq IV x 3  Magnesium sulfate 2 gr IV x1   At 1800 today:  Continue Clinimix E 5/15 to 40 ml/hr - will not increase rate today.  Increase slowly since at risk for refeeding syndrome  HOLD lipids x 7 days in critically ill patient per ASPEN guidelines  Plan to advance as tolerated to the goal rate.  TPN to contain standard multivitamins and trace elements.  Continue LR at 10 ml/hr  Continue SSI .   TPN lab panels on Mondays & Thursdays.  BMP, Magnesium, phosphorus with AM labs   F/u daily.  Per [facility] note, could try trickle feeds   Royetta Asal, PharmD, BCPS Pager [phone] 02/27/2017 10:23 AM

## 2017-02-27 NOTE — Progress Notes (Signed)
Nutrition Follow-up  DOCUMENTATION CODES:   Not applicable  INTERVENTION:  - Continue TPN/TPN advancement per Pharmacy. - Will monitor for nutrition-related plan per Surgery.   Continue to monitor magnesium, potassium, and phosphorus daily for at least 3 days, MD to replete as needed, as pt is at risk for refeeding syndrome given persistent hypokalemia and hypophosphatemia even with low rate TPN.   NUTRITION DIAGNOSIS:   Inadequate oral intake related to inability to eat as evidenced by NPO status. -ongoing  GOAL:   Patient will meet greater than or equal to 90% of their needs -unmet with current TPN regimen  MONITOR:   Weight trends, Labs, I & O's, Other (Comment) (TPN regimen)  ASSESSMENT:   83 y.o. 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  Required intubation after arrival to ICU. Surgery consulted for ileus.  8/20 Pt was extubated and NGT removed this AM; RN reports ~30-40 minutes prior to RD visit. NGT out at this time. RN reports awaiting plan per Surgery at this time; no note yet this AM. Per Dr. Pollie Friar note yesterday AM, if pt was to remain intubated today/longer than today could try trickle feeds. No NGT/OGT in place at this time. Estimated nutrition needs updated based on extubation and resolved sepsis and based on weight from 8/15 (90.7 kg). Weight +1.9 kg since that time.   He has triple lumen L IJ and is receiving Clinimix E 5/15 @ 40 mL/hr with ILE on hold per ICU protocol per ASPEN guidelines. This regimen is providing 48 grams of protein and 682 kcal.   Medications reviewed; 20 mg IV Pepcid BID, 40 mg IV Lasix/day, sliding scale Novolog, 10 mg IV Reglan QID, 30 mmol IV KPhos x1 dose yesterday, no K runs ordered. Labs reviewed; CBG: 126 mg/dL this AM, K: 3.1 mmol/L, Phos: 2.1 mg/dL, Ca: 7.8 mg/dL.   IVF: LR @ 10 mL/hr.    8/17 - RD consulted for patient expected to start TPN.   - Weight is +7 lb since 8/16, used weight from 8/16 for calculation of needs.  - Given low levels of Mg, Phos, K, would continue to monitor for signs of refeeding syndrome.  Patient is currently intubated on ventilator support MV: 10 L/min Temp (24hrs), Avg:98.2 F (36.8 C), Min:97.6 F (36.4 C), Max:98.4 F (36.9 C)  Plan per Pharmacy 8/17: -Will defer starting TPN until 8/18 after electrolytes corrected, hopefully Magnesium normalized. Patient is at high risk for refeeding (?already refeeding without TPN based on labs) so will need to titrate TPN slowly once started.     8/16 -Pt with hemiplegia s/p CVA and wheelchair bound at baseline -Pt sedated on vent. No family at bedside. Pt with large hiatal hernia with majority of the stomach in his chest per IR. Per GI note, "Suspected small bowel and colonic ileus secondary to PNA, sepsis: NGT to LIS, rectal tube inserted." No recent weight history documented but pt appears wt stable compared to 2017 weights. -Pt appears well nourished. If unable to initiate enteral feeds within 5 days would recommend TPN.     Diet Order:  Diet NPO time specified .TPN (CLINIMIX-E) Adult  Skin:  Reviewed, no issues  Last BM:  8/19  Height:   Ht Readings from Last 1 Encounters:  02/22/17 5\' 9"  (1.753 m)    Weight:   Wt Readings from Last 1 Encounters:  02/27/17 204 lb 2.3 oz (92.6 kg)  Ideal Body Weight:  72.7 kg  BMI:  Body mass index is 30.15 kg/m.  Estimated Nutritional Needs:   Kcal:  1815-2085 (20-23 kcal/kg)  Protein:  90-100 grams  Fluid:  >1.7L/day   EDUCATION NEEDS:   No education needs identified at this time    Jarome Matin, MS, RD, LDN, CNSC Inpatient Clinical Dietitian Pager # [phone] After hours/weekend pager # [phone]

## 2017-02-27 NOTE — Progress Notes (Signed)
PULMONARY / CRITICAL CARE MEDICINE   Name: Derek Crawford MRN: [id number] DOB: 1932-12-21    ADMISSION DATE:  02/22/2017 CONSULTATION DATE:  02/22/17  REFERRING MD:  Randal Buba  CHIEF COMPLAINT:  Hypoxia  BRIEF DESCRIPTION:  83 y.o. 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  Required intubation after arrival to ICU.  STUDIES:  CT A / P 8/14 > no free air.  Diffuse ileus.  Suspected cholelithiasis. Large hiatal hernia. Mild renal atrophy.  Patchy bilateral airspace opacities.  CULTURES: Blood 8/14 > negative Sputum 8/14 >  c diff neg 8/15 > negative Blood 8/15 >> GPR >>   ANTIBIOTICS: Vanc 8/14 > 8/16 Zosyn 8/14 > 8/15 Cefepime 8/15 >>  SIGNIFICANT EVENTS: 8/14 > admitted.  Started on BiPAP but failed and required intubation.  CCS consulted for ileus. 8/15 NG placed by IR  LINES/TUBES: ETT 8/14 >  LIJ 8/15 >>  SUBJECTIVE:  Tolerating pressure support.  VITAL SIGNS: BP (!) 144/66   Pulse 60   Temp 98.5 F (36.9 C)   Resp 20   Ht 5\' 9"  (1.753 m)   Wt 204 lb 2.3 oz (92.6 kg)   SpO2 100%   BMI 30.15 kg/m   VENTILATOR SETTINGS: Vent Mode: PSV FiO2 (%):  [30 %-40 %] 40 % Set Rate:  [14 bmp] 14 bmp Vt Set:  [570 mL] 570 mL PEEP:  [5 cmH20] 5 cmH20 Pressure Support:  [5 cmH20] 5 cmH20 Plateau Pressure:  [19 cmH20-22 cmH20] 19 cmH20  INTAKE / OUTPUT: I/O last 3 completed shifts: In: 3154.6 [I.V.:2479.6; IV Piggyback:675] Out: 4098 [Urine:2525; Emesis/NG output:200; Stool:600]  PHYSICAL EXAMINATION:  General - intubated Eyes - pupils reactive ENT - OG in place Cardiac - regular, no murmur Chest - no wheeze, rales Abd - soft, non tender Ext - 1+ edema Skin - no rashes Neuro - normal strength   LABS:  BMET  Recent Labs Lab 02/24/17 1631 02/25/17 0440 02/26/17 0412  NA 142 140 142  K 3.5 3.5 3.8  CL 111 110 112*  CO2 24 25 25   BUN 21* 15 10  CREATININE 0.99 0.92  0.84  GLUCOSE 132* 84 130*    Electrolytes  Recent Labs Lab 02/24/17 0320 02/24/17 1631 02/25/17 0440 02/26/17 0412  CALCIUM 7.1* 7.6* 7.5* 8.0*  MG 2.8*  --  2.4 2.3  PHOS 1.7*  --  2.1* 1.8*    CBC  Recent Labs Lab 02/25/17 0312 02/26/17 0412 02/27/17 0741  WBC 6.7 6.5 6.8  HGB 10.8* 12.3* 11.1*  HCT 32.8* 37.5* 33.9*  PLT 121* 132* 132*    Coag's  Recent Labs Lab 02/22/17 0226 02/22/17 1355  INR 1.00 1.10    Sepsis Markers  Recent Labs Lab 02/22/17 0254 02/22/17 0711 02/22/17 1355 02/23/17 0314 02/24/17 0320  LATICACIDVEN 5.35* 4.27* 2.5*  --   --   PROCALCITON  --   --  1.54 1.14 0.63    ABG  Recent Labs Lab 02/22/17 0524 02/22/17 1150 02/23/17 0350  PHART 7.275* 7.296* 7.367  PCO2ART 39.6 46.2 39.1  PO2ART 116* 225* 98.7    Liver Enzymes  Recent Labs Lab 02/22/17 0226 02/25/17 0440 02/26/17 0412  AST 27 23 26   ALT 14* 15* 18  ALKPHOS 72 38 42  BILITOT 0.4 0.7 0.5  ALBUMIN 4.5 2.3* 2.5*    Cardiac Enzymes  Recent Labs Lab 02/22/17 2145 02/23/17 0314 02/23/17 0816  TROPONINI 0.06*  0.05* 0.04*    Glucose  Recent Labs Lab 02/26/17 1125 02/26/17 1224 02/26/17 1529 02/26/17 1949 02/26/17 2312 02/27/17 0751  GLUCAP 148* 127* 131* 91 109* 126*    Imaging Dg Chest Port 1 View  Result Date: 02/27/2017 CLINICAL DATA:  Acute respiratory failure . EXAM: PORTABLE CHEST 1 VIEW COMPARISON:  Chest x-ray 02/26/2017 . FINDINGS: NG tube again noted, its tip appears to be at the gastroesophageal junction. Further interim advancement should be considered. Endotracheal tube and left IJ line stable position. Heart size stable . Persistent unchanged atelectasis/ infiltrates. Tiny bilateral pleural effusions cannot be excluded. No pneumothorax. Heart size stable. No interim change. IMPRESSION: 1. NG tube tip previously noted in the mid esophagus has been advanced. On today's exam the NG tube tip is at the level of the gastroesophageal  junction. Further interim advancement should be considered. 2. Persistent unchanged bibasilar atelectasis/infiltrates. Tiny bilateral pleural effusions cannot be excluded . These results will be called to the ordering clinician or representative by the Radiologist Assistant, and communication documented in the PACS or zVision Dashboard. Electronically Signed   By: Marcello Moores  Register   On: 02/27/2017 07:06    DISCUSSION: 83 y.o. male from nursing home, admitted with dyspnea and ileus.  Required intubation after arrival to ED. Surgery, GI consulted for ileus.  ASSESSMENT / PLAN:  PULMONARY A: Acute hypoxic respiratory failure.  Aspiration pneumonia. P:   Pressure support wean >> might be ready for extubation soon F/u CXR  CARDIOVASCULAR A:  Hx HTN, HLD. Septic shock, lactate cleared >> resolved. Demand ischemia. P:  Unable to give ASA due to flexiseal Resume lasix  Hold preadmission amlodipine, atorvastatin, lisinopril.  RENAL A:   AKI - sepsis + ACEi >> resolved. AGMA - lactate >> resolved. Hypokalemia /hypophosphatemia. P:   Monitor renal fx  GASTROINTESTINAL A:   Colonic ileus with diarrhea Large hiatal hernia Possible cholelithiasis. P:   Continue NG tube TNA per CCS and GI Reglan per [facility] and GI  HEMATOLOGIC A:   Mild thrombocytopenia. P:  F/u CBC  INFECTIOUS A:   Septic shock- from intrabd source, aspiration with HCAP P:   Day 7 of Abx, currently on cefepime  ENDOCRINE A:   Hx DM.   P:   SSI Hold preadmission lantus  NEUROLOGIC A:   Acute encephalopathy - due to sedation. Hx right hemiplegia secondary to CVA, ? Seizures (on depakote as outpatient). P:   Continue depakote RASS goal 0  CC time 31 minutes  Chesley Mires, MD [place] 02/27/2017, 8:44 AM Pager:  [phone] After 3pm call: [phone]

## 2017-02-27 NOTE — Care Management Note (Signed)
Case Management Note  Patient Details  Name: Derek Crawford MRN: [id number] Date of Birth: 07-Apr-1933  Subjective/Objective:     Remains on full vent support               Action/Plan: Date:  February 27, 2017 Chart reviewed for concurrent status and case management needs. Will continue to follow patient progress. Discharge Planning: following for needs Expected discharge date: [id number] Velva Harman, BSN, [place], [place]  Expected Discharge Date:   (UNKNOWN)               Expected Discharge Plan:  Home/Self Care  In-House Referral:     Discharge planning Services  CM Consult  Post Acute Care Choice:    Choice offered to:     DME Arranged:    DME Agency:     HH Arranged:    HH Agency:     Status of Service:  In process, will continue to follow  If discussed at Long Length of Stay Meetings, dates discussed:    Additional Comments:  Leeroy Cha, RN 02/27/2017, 8:52 AM

## 2017-02-28 ENCOUNTER — Inpatient Hospital Stay (HOSPITAL_COMMUNITY): Payer: Medicare Other

## 2017-02-28 LAB — GLUCOSE, CAPILLARY
GLUCOSE-CAPILLARY: 169 mg/dL — AB (ref 65–99)
GLUCOSE-CAPILLARY: 120 mg/dL — AB (ref 65–99)
Glucose-Capillary: 102 mg/dL — ABNORMAL HIGH (ref 65–99)
Glucose-Capillary: 125 mg/dL — ABNORMAL HIGH (ref 65–99)
Glucose-Capillary: 128 mg/dL — ABNORMAL HIGH (ref 65–99)

## 2017-02-28 LAB — CBC
HEMATOCRIT: 31.6 % — AB (ref 39.0–52.0)
Hemoglobin: 10.5 g/dL — ABNORMAL LOW (ref 13.0–17.0)
MCH: 26.8 pg (ref 26.0–34.0)
MCHC: 33.2 g/dL (ref 30.0–36.0)
MCV: 80.6 fL (ref 78.0–100.0)
Platelets: 124 10*3/uL — ABNORMAL LOW (ref 150–400)
RBC: 3.92 MIL/uL — ABNORMAL LOW (ref 4.22–5.81)
RDW: 14.2 % (ref 11.5–15.5)
WBC: 6.5 10*3/uL (ref 4.0–10.5)

## 2017-02-28 LAB — BASIC METABOLIC PANEL
Anion gap: 6 (ref 5–15)
BUN: 12 mg/dL (ref 6–20)
CALCIUM: 8 mg/dL — AB (ref 8.9–10.3)
CO2: 31 mmol/L (ref 22–32)
CREATININE: 0.91 mg/dL (ref 0.61–1.24)
Chloride: 104 mmol/L (ref 101–111)
GLUCOSE: 129 mg/dL — AB (ref 65–99)
Potassium: 3 mmol/L — ABNORMAL LOW (ref 3.5–5.1)
Sodium: 141 mmol/L (ref 135–145)

## 2017-02-28 LAB — MAGNESIUM: Magnesium: 1.6 mg/dL — ABNORMAL LOW (ref 1.7–2.4)

## 2017-02-28 LAB — PHOSPHORUS: Phosphorus: 2.2 mg/dL — ABNORMAL LOW (ref 2.5–4.6)

## 2017-02-28 MED ORDER — POTASSIUM PHOSPHATES 15 MMOLE/5ML IV SOLN
20.0000 mmol | Freq: Once | INTRAVENOUS | Status: AC
  Administered 2017-02-28: 20 mmol via INTRAVENOUS
  Filled 2017-02-28: qty 6.67

## 2017-02-28 MED ORDER — POTASSIUM CHLORIDE 10 MEQ/100ML IV SOLN
10.0000 meq | INTRAVENOUS | Status: AC
  Administered 2017-02-28 (×4): 10 meq via INTRAVENOUS
  Filled 2017-02-28 (×4): qty 100

## 2017-02-28 MED ORDER — MAGNESIUM SULFATE 2 GM/50ML IV SOLN
2.0000 g | Freq: Once | INTRAVENOUS | Status: AC
  Administered 2017-02-28: 2 g via INTRAVENOUS
  Filled 2017-02-28: qty 50

## 2017-02-28 MED ORDER — TRACE MINERALS CR-CU-MN-SE-ZN 10-1000-500-60 MCG/ML IV SOLN
INTRAVENOUS | Status: AC
  Administered 2017-02-28: 18:00:00 via INTRAVENOUS
  Filled 2017-02-28 (×2): qty 960

## 2017-02-28 MED ORDER — INSULIN ASPART 100 UNIT/ML ~~LOC~~ SOLN
0.0000 [IU] | SUBCUTANEOUS | Status: DC
  Administered 2017-02-28 (×2): 2 [IU] via SUBCUTANEOUS
  Administered 2017-02-28: 3 [IU] via SUBCUTANEOUS
  Administered 2017-03-01 (×4): 2 [IU] via SUBCUTANEOUS
  Administered 2017-03-01: 3 [IU] via SUBCUTANEOUS
  Administered 2017-03-02 (×3): 2 [IU] via SUBCUTANEOUS
  Administered 2017-03-02 – 2017-03-03 (×2): 3 [IU] via SUBCUTANEOUS
  Administered 2017-03-03: 2 [IU] via SUBCUTANEOUS
  Administered 2017-03-03: 3 [IU] via SUBCUTANEOUS
  Administered 2017-03-03 (×3): 2 [IU] via SUBCUTANEOUS
  Administered 2017-03-04: 3 [IU] via SUBCUTANEOUS
  Administered 2017-03-04: 2 [IU] via SUBCUTANEOUS
  Administered 2017-03-04 (×4): 3 [IU] via SUBCUTANEOUS
  Administered 2017-03-05: 2 [IU] via SUBCUTANEOUS
  Administered 2017-03-05: 3 [IU] via SUBCUTANEOUS
  Administered 2017-03-05: 2 [IU] via SUBCUTANEOUS
  Administered 2017-03-05 (×2): 3 [IU] via SUBCUTANEOUS
  Administered 2017-03-05 – 2017-03-06 (×5): 2 [IU] via SUBCUTANEOUS
  Administered 2017-03-06 (×2): 3 [IU] via SUBCUTANEOUS
  Administered 2017-03-07: 2 [IU] via SUBCUTANEOUS
  Administered 2017-03-07 – 2017-03-08 (×8): 3 [IU] via SUBCUTANEOUS
  Administered 2017-03-08 (×2): 5 [IU] via SUBCUTANEOUS
  Administered 2017-03-08: 3 [IU] via SUBCUTANEOUS
  Administered 2017-03-09: 2 [IU] via SUBCUTANEOUS
  Administered 2017-03-09: 5 [IU] via SUBCUTANEOUS
  Administered 2017-03-09: 3 [IU] via SUBCUTANEOUS
  Administered 2017-03-09: 2 [IU] via SUBCUTANEOUS
  Administered 2017-03-09: 3 [IU] via SUBCUTANEOUS
  Administered 2017-03-10 – 2017-03-11 (×2): 2 [IU] via SUBCUTANEOUS
  Administered 2017-03-14: 3 [IU] via SUBCUTANEOUS

## 2017-02-28 MED ORDER — FAT EMULSION 20 % IV EMUL
240.0000 mL | INTRAVENOUS | Status: AC
  Administered 2017-02-28: 240 mL via INTRAVENOUS
  Filled 2017-02-28 (×2): qty 250

## 2017-02-28 NOTE — Progress Notes (Signed)
[place] NOTE   Pharmacy Consult for TPN Indication: prolonged ileus  Patient Measurements: Height: 5\' 9"  (175.3 cm) Weight: 203 lb 11.3 oz (92.4 kg) IBW/kg (Calculated) : 70.7  AdjBW: 76kg  Body mass index is 30.08 kg/m.   HPI: 83 yo M admitted from NH on 02/22/17 with ileus and difficulty breathing.  He remains intubated.  He is not a candidate for tube feedings due to hiatal hernia/risk of aspiration.  He is having bowel sounds/loose stools & should be able to tolerate oral diet once extubated per Dr Elsworth Soho.  Pharmacy consulted to start TPN while patient is NPO.   Significant events:  8/20 Extubated and NGT removed.  MD to consider trickle feeds, but no NGT/OGT in place at this time. 8/21 Changed from ICU to telemetry status, will resume lipids in TPN dosing.  Xray shows persistent SB ileus.  Insulin requirements past 24 hours: 12 units Novolog from ICU hyperglycemia protocol  Current Nutrition: NPO  IVF: LR at 10 ml/hr  Central access:  CVC triple lumen placed 8/15 TPN start date: 8/18  ASSESSMENT                                                                                                          Today:   Glucose:  CBGs at goal (<150) on ICU hyperglycemia protocol, changed to SSI today.  Hx DM on Lantus 25 units/day PTA.    Electrolytes:  Na WNL, but K, Mag and Phos remain low despite replacement yesterday  (Mag 2g, KPhos, KCl 8/20)  Renal:  SCr 0.91  LFTs:  WNL (8/20)  TGs:  122 (8/20)  Prealbumin:  9.6 (8/20)   NUTRITIONAL GOALS                                                                                             RD recs (8/20):  90-100 Protein, 1815-2085 Kcal Clinimix 5/15 at a goal rate of 83 ml/hr + 20% fat emulsion 240 ml/day to provide: 100 g of protein and 1894 kCals per day meeting 100% of protein and 100% of kCal needs   Based on ASPEN guidelines, while the patient meets ICU status the initiation of lipids  is being delayed for 7 days or until transition out of ICU. Planned start date for lipids is 8/25.  Goal will be to meet 100% of the patient's protein needs and approximately 80% of their caloric needs. Clinimix 5/15 @ 83 mL/hr to provide 100 grams protein (100% of goal) and 1414 kcal (78% of goal)    PLAN  Now: Magnesium 2g IV x1 KPhos 20 mmol IV x1 (also provides ~ 29 mEq K) KCl 10 mEq IV x4 runs (along with Kphos, will provide ~ 70 mEq K total)   At 1800 today:  Continue Clinimix E 5/15 at 40 ml/hr - do not advance rate today d/t significant electrolyte abnormalities and risk for refeeding syndrome  20% fat emulsion at 24ml/hr over 12 hr  Plan to advance as tolerated to the goal rate; advancing slowly d/t risk for refeeding syndrome.  TPN to contain standard multivitamins daily and trace elements only M/W/F.  Contineu IVF at 10 ml/hr.  Continue CBGs and moderate SSI q6h.   TPN lab panels on Mondays & Thursdays.  BMET, Mag, Phos on Wed 8/22  Continue Metoclopramide per MD   Gretta Arab PharmD, BCPS Pager [phone] 02/28/2017 8:05 AM

## 2017-02-28 NOTE — Progress Notes (Signed)
Pt transferred from ICU. Will continue to follow for discharge needs.

## 2017-02-28 NOTE — Progress Notes (Signed)
[facility] Progress Note  Subjective:  Awake and alert.  Shakes head but does not answer verbally.  Abdominal x-ray from this AM--  IMPRESSION: Decreased distention of large bowel in the right lower quadrant of the abdomen may reflect a resolving sigmoid volvulus. Persistent gaseous distention of small-bowel loops consistent with ileus.   Objective:  Vital signs in last 24 hours: Temp:  [98.5 F (36.9 C)-100.6 F (38.1 C)] 99.5 F (37.5 C) (08/21 0710) Pulse Rate:  [62-90] 62 (08/21 0900) Resp:  [18-31] 22 (08/21 0900) BP: (106-169)/(34-77) 127/36 (08/21 0900) SpO2:  [92 %-100 %] 95 % (08/21 0900) Weight:  [203 lb 11.3 oz (92.4 kg)] 203 lb 11.3 oz (92.4 kg) (08/21 0533) Last BM Date: 02/27/17 (rectal tube) General:  Alert, Well-developed, in NAD Heart:  Regular rate and rhythm; no murmurs Pulm:  Crackles heard B/L.  No increased WOB. Abdomen:  Soft, minimal distention.  BS present but still quiet and hypoactive.  Non-tender.  Extremities:  Without edema.  Intake/Output from previous day: 08/20 0701 - 08/21 0700 In: 2310 [I.V.:1240; IV Piggyback:1070] Out: 4600 [Urine:3850; Emesis/NG output:200; Stool:550] Intake/Output this shift: Total I/O In: 175 [I.V.:175] Out: 165 [Urine:165]  Lab Results:  Recent Labs  02/26/17 0412 02/27/17 0741 02/28/17 0944  WBC 6.5 6.8 6.5  HGB 12.3* 11.1* 10.5*  HCT 37.5* 33.9* 31.6*  PLT 132* 132* 124*   BMET  Recent Labs  02/26/17 0412 02/27/17 0741 02/28/17 0944  NA 142 140 141  K 3.8 3.1* 3.0*  CL 112* 109 104  CO2 25 25 31   GLUCOSE 130* 131* 129*  BUN 10 10 12   CREATININE 0.84 0.85 0.91  CALCIUM 8.0* 7.8* 8.0*   LFT  Recent Labs  02/27/17 0741  PROT 5.2*  ALBUMIN 2.2*  AST 22  ALT 17  ALKPHOS 37*  BILITOT 0.6   Dg Abd 1 View  Result Date: 02/28/2017 CLINICAL DATA:  Follow-up ileus EXAM: ABDOMEN - 1 VIEW COMPARISON:  Supine abdominal radiograph dated February 27, 2017 FINDINGS: There  remain loops of mildly distended gas-filled small and large bowel. The degree of large bowel distention has decreased somewhat. No free extraluminal gas collections are observed. There is no significant rectal gas. There degenerative changes of the lower lumbar spine. IMPRESSION: Decreased distention of large bowel in the right lower quadrant of the abdomen may reflect a resolving sigmoid volvulus. Persistent gaseous distention of small-bowel loops consistent with ileus. Electronically Signed   By: David  Martinique M.D.   On: 02/28/2017 07:06   Dg Chest Port 1 View  Result Date: 02/27/2017 CLINICAL DATA:  Acute respiratory failure . EXAM: PORTABLE CHEST 1 VIEW COMPARISON:  Chest x-ray 02/26/2017 . FINDINGS: NG tube again noted, its tip appears to be at the gastroesophageal junction. Further interim advancement should be considered. Endotracheal tube and left IJ line stable position. Heart size stable . Persistent unchanged atelectasis/ infiltrates. Tiny bilateral pleural effusions cannot be excluded. No pneumothorax. Heart size stable. No interim change. IMPRESSION: 1. NG tube tip previously noted in the mid esophagus has been advanced. On today's exam the NG tube tip is at the level of the gastroesophageal junction. Further interim advancement should be considered. 2. Persistent unchanged bibasilar atelectasis/infiltrates. Tiny bilateral pleural effusions cannot be excluded . These results will be called to the ordering clinician or representative by the Radiologist Assistant, and communication documented in the PACS or zVision Dashboard. Electronically Signed   By: Marcello Moores  Register   On: 02/27/2017 07:06  Dg Abd Portable 1v  Result Date: 02/27/2017 CLINICAL DATA:  Distended abdomen.  Ileus. EXAM: PORTABLE ABDOMEN - 1 VIEW COMPARISON:  02/23/2017.  02/22/2017.  CT 02/22/2017. FINDINGS: Persistent distended colon again noted with slight improvement. No prominent small-bowel distention on today's examination .  Findings again consistent with adynamic ileus. Distal colonic obstruction including sigmoid volvulus cannot be excluded. No free air identified. Degenerative changes lumbar spine and both hips . IMPRESSION: Persistent distended colon again noted with slight improvement. No prominent small bowel distention on today's exam. Findings consist with adynamic ileus. Distal colonic obstruction including sigmoid volvulus cannot be excluded . Continued follow-up suggested to demonstrate resolution. Electronically Signed   By: Marcello Moores  Register   On: 02/27/2017 09:30   Assessment / Plan: *Ileus secondary to PNA, sepsis:  Improving.  Rectal tube/flexisealstill in place but OG has been removed with extubation. Keep electrolytes WNL's, ideally should keep K+ 4 or greater (was 3.0 this AM). Reglan 10 mg IV every 6 hours was added 8/16.  Now on TNA. *Acute hypoxic respiratory failure due to aspiration PNA:  Extubated 8/20. *AKI:  Resolved.  -Continue with present care: Rectal tube, reglan, bowel rest (now on TNA), turning patient side to side, correcting electrolyte abnormalities.   LOS: 6 days   ZEHR, JESSICA D.  02/28/2017, 10:39 AM  Pager number [phone]  ________________________________________________________________________  [facility] MD note:  I personally examined the patient, reviewed the data and agree with the assessment and plan described above.  Volvulus has been mentioned in the past two KUB reports and so I reviewed the images with GI radiologist.  We looked at last week's CT scan and the KUBs since then. He does not feel there is/was a volvulus.  He had colonoscopy 2015 and so it is very unlikely there is a distal colonic neoplasm causing obstruction. He is improving clinically: abd fairly soft, BS+.  I will allow clears , d/c the rectal tube and see how he does.     Owens Loffler, MD [facility] Pager [phone]

## 2017-02-28 NOTE — Progress Notes (Signed)
PULMONARY / CRITICAL CARE MEDICINE   Name: Derek Crawford MRN: [id number] DOB: [birth year]    ADMISSION DATE:  02/22/2017 CONSULTATION DATE:  02/22/17  REFERRING MD:  Randal Buba  CHIEF COMPLAINT:  Hypoxia  BRIEF DESCRIPTION:  83 y.o. 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  Required intubation after arrival to ICU.  STUDIES:  CT A / P 8/14 > no free air.  Diffuse ileus.  Suspected cholelithiasis. Large hiatal hernia. Mild renal atrophy.  Patchy bilateral airspace opacities.  CULTURES: Blood 8/14 > negative c diff neg 8/15 > negative Blood 8/15 >> Corynebacterium in 1 of 2  ANTIBIOTICS: Vanc 8/14 > 8/16 Zosyn 8/14 > 8/15 Cefepime 8/15 >>  SIGNIFICANT EVENTS: 8/14  admitted.  Started on BiPAP but failed and required intubation.  CCS consulted for ileus. 8/15 NG placed by IR  LINES/TUBES: ETT 8/14 > 8/20 LIJ 8/15 >>  SUBJECTIVE:  Denies chest or abd pain.  VITAL SIGNS: BP (!) 106/34   Pulse 62   Temp 99.5 F (37.5 C) (Oral)   Resp (!) 22   Ht 5\' 9"  (1.753 m)   Wt 203 lb 11.3 oz (92.4 kg)   SpO2 94%   BMI 30.08 kg/m   INTAKE / OUTPUT: I/O last 3 completed shifts: In: 3673 [I.V.:2238; IV Piggyback:1435] Out: 9563 [Urine:4700; Emesis/NG output:200; Stool:750]  PHYSICAL EXAMINATION:  General - alert Eyes - pupils reactive ENT - weak voice Cardiac - regular, no murmur Chest - no wheeze, rales Abd - soft, non tender, decreased bowel sounds Ext - 1+ edema Skin - no rashes Neuro - follows simple commands, able to state his name    LABS:  BMET  Recent Labs Lab 02/25/17 0440 02/26/17 0412 02/27/17 0741  NA 140 142 140  K 3.5 3.8 3.1*  CL 110 112* 109  CO2 25 25 25   BUN 15 10 10   CREATININE 0.92 0.84 0.85  GLUCOSE 84 130* 131*    Electrolytes  Recent Labs Lab 02/25/17 0440 02/26/17 0412 02/27/17 0741  CALCIUM 7.5* 8.0* 7.8*  MG 2.4 2.3 1.7  PHOS 2.1* 1.8*  2.1*    CBC  Recent Labs Lab 02/25/17 0312 02/26/17 0412 02/27/17 0741  WBC 6.7 6.5 6.8  HGB 10.8* 12.3* 11.1*  HCT 32.8* 37.5* 33.9*  PLT 121* 132* 132*    Coag's  Recent Labs Lab 02/22/17 0226 02/22/17 1355  INR 1.00 1.10    Sepsis Markers  Recent Labs Lab 02/22/17 0254 02/22/17 0711 02/22/17 1355 02/23/17 0314 02/24/17 0320  LATICACIDVEN 5.35* 4.27* 2.5*  --   --   PROCALCITON  --   --  1.54 1.14 0.63    ABG  Recent Labs Lab 02/22/17 0524 02/22/17 1150 02/23/17 0350  PHART 7.275* 7.296* 7.367  PCO2ART 39.6 46.2 39.1  PO2ART 116* 225* 98.7    Liver Enzymes  Recent Labs Lab 02/25/17 0440 02/26/17 0412 02/27/17 0741  AST 23 26 22   ALT 15* 18 17  ALKPHOS 38 42 37*  BILITOT 0.7 0.5 0.6  ALBUMIN 2.3* 2.5* 2.2*    Cardiac Enzymes  Recent Labs Lab 02/22/17 2145 02/23/17 0314 02/23/17 0816  TROPONINI 0.06* 0.05* 0.04*    Glucose  Recent Labs Lab 02/27/17 0751 02/27/17 1227 02/27/17 1538 02/27/17 1926 02/27/17 2304 02/28/17 0319  GLUCAP 126* 127* 155* 116* 156* 102*    Imaging Dg Abd 1 View  Result Date: 02/28/2017 CLINICAL DATA:  Follow-up ileus  EXAM: ABDOMEN - 1 VIEW COMPARISON:  Supine abdominal radiograph dated February 27, 2017 FINDINGS: There remain loops of mildly distended gas-filled small and large bowel. The degree of large bowel distention has decreased somewhat. No free extraluminal gas collections are observed. There is no significant rectal gas. There degenerative changes of the lower lumbar spine. IMPRESSION: Decreased distention of large bowel in the right lower quadrant of the abdomen may reflect a resolving sigmoid volvulus. Persistent gaseous distention of small-bowel loops consistent with ileus. Electronically Signed   By: David  Martinique M.D.   On: 02/28/2017 07:06   Dg Abd Portable 1v  Result Date: 02/27/2017 CLINICAL DATA:  Distended abdomen.  Ileus. EXAM: PORTABLE ABDOMEN - 1 VIEW COMPARISON:  02/23/2017.   02/22/2017.  CT 02/22/2017. FINDINGS: Persistent distended colon again noted with slight improvement. No prominent small-bowel distention on today's examination . Findings again consistent with adynamic ileus. Distal colonic obstruction including sigmoid volvulus cannot be excluded. No free air identified. Degenerative changes lumbar spine and both hips . IMPRESSION: Persistent distended colon again noted with slight improvement. No prominent small bowel distention on today's exam. Findings consist with adynamic ileus. Distal colonic obstruction including sigmoid volvulus cannot be excluded . Continued follow-up suggested to demonstrate resolution. Electronically Signed   By: Marcello Moores  Register   On: 02/27/2017 09:30    DISCUSSION: 83 y.o. male from nursing home, admitted with dyspnea and ileus.  Required intubation after arrival to ED. Surgery, GI consulted for ileus.  ASSESSMENT / PLAN:  PULMONARY A: Acute hypoxic respiratory failure 2nd to aspiration pneumonia. P:   Bronchial hygiene.  CARDIOVASCULAR A:  Hx HTN, HLD. Septic shock, lactate cleared >> resolved. Demand ischemia. P:  Unable to give ASA due to flexiseal Continue IV lasix  Hold preadmission amlodipine, atorvastatin, lisinopril.  RENAL A:   AKI - sepsis + ACEi >> resolved. AGMA - lactate >> resolved. Hypokalemia /hypophosphatemia. P:   Monitor renal fx F/u BMET  GASTROINTESTINAL A:   Colonic ileus with diarrhea Large hiatal hernia Possible cholelithiasis. P:   Continue TNA and advance diet based on GI recommendations Reglan per GI  HEMATOLOGIC A:   Mild thrombocytopenia. P:  F/u CBC  INFECTIOUS A:   Septic shock- from aspiration with HCAP. P:   Day 8 of cefepime >> d/c after dose on 8/21  ENDOCRINE A:   Hx DM.   P:   SSI Hold preadmission lantus  NEUROLOGIC A:   Acute encephalopathy - due to sedation. Hx right hemiplegia secondary to CVA, ? Seizures (on depakote as outpatient). P:    Continue depakote PT/OT consult  Transfer to floor tele 8/21.  Will ask [facility] to assume care 8/22 and PCCM off.  Chesley Mires, MD [facility] Pulmonary/Critical Care 02/28/2017, 7:55 AM Pager:  [phone] After 3pm call: [phone]

## 2017-02-28 NOTE — Progress Notes (Signed)
CSW assisting with dc planning. Pt extubated on 02/27/17. Pt's son has requested new placement for pt. PT is pending. Once PT recommendations are available new SNF search will be initiated. VM left for son to discuss dc planning / insurance coverage. CSW will continue to follow to assist with d/c planning.  Werner Lean LCSW [phone]

## 2017-02-28 NOTE — Progress Notes (Signed)
[facility]:  [phone] General Surgery Progress Note   LOS: 6 days  POD -     Chief Complaint: Acute respiratory failure  Assessment and Plan: 1.  Ileus - better  Also followed by [facility]  NGT out - No acute surgical issues  Will sign off  2.  Acute respiratory failure - PNA (extubated 8/20)  Cefepime  WBC is better  3.  DVT prophylaxis - SQ Heparin 4.  History of right hemiparesis after CVA 5.  DM 6.  Significant HH 7.  On TPN   Active Problems:   Respiratory failure ([place])   Ileus ([place])   Encounter for central line placement  Subjective:  Now extubated.  Arouses, but does not respond verbally.  Objective:   Vitals:   02/28/17 0639 02/28/17 0710  BP:    Pulse: 62   Resp: (!) 22   Temp:  99.5 F (37.5 C)  SpO2: 94%      Intake/Output from previous day:  08/20 0701 - 08/21 0700 In: 2310 [I.V.:1240; IV Piggyback:1070] Out: 4600 [Urine:3850; Emesis/NG output:200; Stool:550]  Intake/Output this shift:  No intake/output data recorded.   Physical Exam:   General: Older AA M, who arouses.  Is breathing without difficulty.    HEENT: Normal. Pupils equal. .   Lungs: Some bilateral rhonchi   Abdomen: Abdomen is softer.  Has BS.  No localized tenderness.   Lab Results:     Recent Labs  02/26/17 0412 02/27/17 0741  WBC 6.5 6.8  HGB 12.3* 11.1*  HCT 37.5* 33.9*  PLT 132* 132*    BMET    Recent Labs  02/26/17 0412 02/27/17 0741  NA 142 140  K 3.8 3.1*  CL 112* 109  CO2 25 25  GLUCOSE 130* 131*  BUN 10 10  CREATININE 0.84 0.85  CALCIUM 8.0* 7.8*    PT/INR  No results for input(s): LABPROT, INR in the last 72 hours.  ABG  No results for input(s): PHART, HCO3 in the last 72 hours.  Invalid input(s): PCO2, PO2   Studies/Results:  Dg Abd 1 View  Result Date: 02/28/2017 CLINICAL DATA:  Follow-up ileus EXAM: ABDOMEN - 1 VIEW COMPARISON:  Supine abdominal radiograph dated February 27, 2017 FINDINGS: There remain loops  of mildly distended gas-filled small and large bowel. The degree of large bowel distention has decreased somewhat. No free extraluminal gas collections are observed. There is no significant rectal gas. There degenerative changes of the lower lumbar spine. IMPRESSION: Decreased distention of large bowel in the right lower quadrant of the abdomen may reflect a resolving sigmoid volvulus. Persistent gaseous distention of small-bowel loops consistent with ileus. Electronically Signed   By: Ras Kollman  Martinique M.D.   On: 02/28/2017 07:06   Dg Chest Port 1 View  Result Date: 02/27/2017 CLINICAL DATA:  Acute respiratory failure . EXAM: PORTABLE CHEST 1 VIEW COMPARISON:  Chest x-ray 02/26/2017 . FINDINGS: NG tube again noted, its tip appears to be at the gastroesophageal junction. Further interim advancement should be considered. Endotracheal tube and left IJ line stable position. Heart size stable . Persistent unchanged atelectasis/ infiltrates. Tiny bilateral pleural effusions cannot be excluded. No pneumothorax. Heart size stable. No interim change. IMPRESSION: 1. NG tube tip previously noted in the mid esophagus has been advanced. On today's exam the NG tube tip is at the level of the gastroesophageal junction. Further interim advancement should be considered. 2. Persistent unchanged bibasilar atelectasis/infiltrates. Tiny bilateral pleural effusions cannot be excluded . These  results will be called to the ordering clinician or representative by the Radiologist Assistant, and communication documented in the PACS or zVision Dashboard. Electronically Signed   By: Marcello Moores  Register   On: 02/27/2017 07:06   Dg Abd Portable 1v  Result Date: 02/27/2017 CLINICAL DATA:  Distended abdomen.  Ileus. EXAM: PORTABLE ABDOMEN - 1 VIEW COMPARISON:  02/23/2017.  02/22/2017.  CT 02/22/2017. FINDINGS: Persistent distended colon again noted with slight improvement. No prominent small-bowel distention on today's examination . Findings  again consistent with adynamic ileus. Distal colonic obstruction including sigmoid volvulus cannot be excluded. No free air identified. Degenerative changes lumbar spine and both hips . IMPRESSION: Persistent distended colon again noted with slight improvement. No prominent small bowel distention on today's exam. Findings consist with adynamic ileus. Distal colonic obstruction including sigmoid volvulus cannot be excluded . Continued follow-up suggested to demonstrate resolution. Electronically Signed   By: Marcello Moores  Register   On: 02/27/2017 09:30     Anti-infectives:   Anti-infectives    Start     Dose/Rate Route Frequency Ordered Stop   02/25/17 2000  ceFEPIme (MAXIPIME) 1 g in dextrose 5 % 50 mL IVPB     1 g 100 mL/hr over 30 Minutes Intravenous Every 8 hours 02/25/17 1252     02/24/17 0200  vancomycin (VANCOCIN) IVPB 1000 mg/200 mL premix  Status:  Discontinued     1,000 mg 200 mL/hr over 60 Minutes Intravenous Every 48 hours 02/22/17 1051 02/23/17 0848   02/22/17 1200  vancomycin (VANCOCIN) 50 mg/mL oral solution 500 mg  Status:  Discontinued     500 mg Oral Every 6 hours 02/22/17 1018 02/22/17 1019   02/22/17 1200  vancomycin (VANCOCIN) 50 mg/mL oral solution 500 mg  Status:  Discontinued     500 mg Per Tube Every 6 hours 02/22/17 1019 02/22/17 2227   02/22/17 1200  ceFEPIme (MAXIPIME) 2 g in dextrose 5 % 50 mL IVPB  Status:  Discontinued     2 g 100 mL/hr over 30 Minutes Intravenous Every 24 hours 02/22/17 1052 02/25/17 1253   02/22/17 1030  metroNIDAZOLE (FLAGYL) IVPB 500 mg  Status:  Discontinued     500 mg 100 mL/hr over 60 Minutes Intravenous Every 8 hours 02/22/17 1014 02/23/17 0848   02/22/17 0300  vancomycin (VANCOCIN) IVPB 1000 mg/200 mL premix     1,000 mg 200 mL/hr over 60 Minutes Intravenous  Once 02/22/17 0257 02/22/17 0412   02/22/17 0300  piperacillin-tazobactam (ZOSYN) IVPB 3.375 g     3.375 g 100 mL/hr over 30 Minutes Intravenous  Once 02/22/17 0257 02/22/17 0345       Alphonsa Overall, MD, FACS Pager: [facility]: [phone] 02/28/2017

## 2017-03-01 DIAGNOSIS — A419 Sepsis, unspecified organism: Principal | ICD-10-CM

## 2017-03-01 DIAGNOSIS — R140 Abdominal distension (gaseous): Secondary | ICD-10-CM

## 2017-03-01 LAB — CBC
HCT: 34.8 % — ABNORMAL LOW (ref 39.0–52.0)
Hemoglobin: 11.2 g/dL — ABNORMAL LOW (ref 13.0–17.0)
MCH: 26.3 pg (ref 26.0–34.0)
MCHC: 32.2 g/dL (ref 30.0–36.0)
MCV: 81.7 fL (ref 78.0–100.0)
PLATELETS: 140 10*3/uL — AB (ref 150–400)
RBC: 4.26 MIL/uL (ref 4.22–5.81)
RDW: 14.2 % (ref 11.5–15.5)
WBC: 8.4 10*3/uL (ref 4.0–10.5)

## 2017-03-01 LAB — BASIC METABOLIC PANEL
ANION GAP: 6 (ref 5–15)
BUN: 13 mg/dL (ref 6–20)
CALCIUM: 8.2 mg/dL — AB (ref 8.9–10.3)
CO2: 31 mmol/L (ref 22–32)
CREATININE: 0.84 mg/dL (ref 0.61–1.24)
Chloride: 103 mmol/L (ref 101–111)
GFR calc Af Amer: >60 mL/min (ref >60)
GLUCOSE: 132 mg/dL — AB (ref 65–99)
Potassium: 3.1 mmol/L — ABNORMAL LOW (ref 3.5–5.1)
Sodium: 140 mmol/L (ref 135–145)

## 2017-03-01 LAB — GLUCOSE, CAPILLARY
GLUCOSE-CAPILLARY: 131 mg/dL — AB (ref 65–99)
GLUCOSE-CAPILLARY: 139 mg/dL — AB (ref 65–99)
GLUCOSE-CAPILLARY: 156 mg/dL — AB (ref 65–99)
Glucose-Capillary: 110 mg/dL — ABNORMAL HIGH (ref 65–99)
Glucose-Capillary: 112 mg/dL — ABNORMAL HIGH (ref 65–99)
Glucose-Capillary: 127 mg/dL — ABNORMAL HIGH (ref 65–99)
Glucose-Capillary: 143 mg/dL — ABNORMAL HIGH (ref 65–99)

## 2017-03-01 LAB — MAGNESIUM: Magnesium: 1.8 mg/dL (ref 1.7–2.4)

## 2017-03-01 LAB — PHOSPHORUS: Phosphorus: 2.2 mg/dL — ABNORMAL LOW (ref 2.5–4.6)

## 2017-03-01 MED ORDER — FAT EMULSION 20 % IV EMUL
120.0000 mL | INTRAVENOUS | Status: AC
  Administered 2017-03-01: 120 mL via INTRAVENOUS
  Filled 2017-03-01: qty 250

## 2017-03-01 MED ORDER — TRACE MINERALS CR-CU-MN-SE-ZN 10-1000-500-60 MCG/ML IV SOLN
INTRAVENOUS | Status: AC
  Administered 2017-03-01: 18:00:00 via INTRAVENOUS
  Filled 2017-03-01: qty 960

## 2017-03-01 MED ORDER — POTASSIUM CHLORIDE 10 MEQ/100ML IV SOLN
10.0000 meq | INTRAVENOUS | Status: AC
  Administered 2017-03-01 (×6): 10 meq via INTRAVENOUS
  Filled 2017-03-01 (×6): qty 100

## 2017-03-01 MED ORDER — POTASSIUM PHOSPHATES 15 MMOLE/5ML IV SOLN
20.0000 mmol | Freq: Once | INTRAVENOUS | Status: AC
  Administered 2017-03-01: 20 mmol via INTRAVENOUS
  Filled 2017-03-01: qty 6.67

## 2017-03-01 NOTE — Care Management Important Message (Signed)
Important Message  Patient Details  Name: Derek Crawford MRN: [id number] Date of Birth: 1933-06-11   Medicare Important Message Given:  Yes    Kerin Salen 03/01/2017, 11:29 [place] Message  Patient Details  Name: Derek Crawford MRN: [id number] Date of Birth: April 08, 1933   Medicare Important Message Given:  Yes    Kerin Salen 03/01/2017, 11:29 AM

## 2017-03-01 NOTE — Progress Notes (Addendum)
[facility]. Per previous CSW notes, family has expressed that they want new SNF as they are displeased with care there. PT evaluation this morning yielded SNF recommendation- pt uses wheelchair at baseline. Per previous CSW interaction- unclear if pt has Medicaid coverage for Long Term Care payor source.  Pt on TPN. [facility] states facility cannot manage TPN if pt still needing this at DC.  Left voicemail with son Marden Noble to discuss recommendations prior to proceeding with DC planning.   Sharren Bridge, MSW, LCSW Clinical Social Work 03/01/2017 [phone]  12:07-Spoke with son- discussed above, and son voices that he would like to pursue new SNF placement (LTC- under Medicaid) anywhere in the [place], [place], [place], [place] area. Understanding that if pt needing TPN at DC this will limit facility options. Son states if no new placement found by discharge date, he would prefer for pt to return to [facility] and he will continue pursuing his options from there.  CSW spoke with facility and updated on above- they are agreeable to plan as well.  Also provided pt's Medicaid #: [id number] O Will complete FL2 and initiate SNF search.

## 2017-03-01 NOTE — Evaluation (Signed)
Clinical/Bedside Swallow Evaluation Patient Details  Name: Derek Crawford MRN: [id number] Date of Birth: [birth year]  Today's Date: 03/01/2017 Time: SLP Start Time (ACUTE ONLY): 1015 SLP Stop Time (ACUTE ONLY): 1035 SLP Time Calculation (min) (ACUTE ONLY): 20 min  Past Medical History:  Past Medical History:  Diagnosis Date  . Diabetes mellitus without complication ([place])   . GERD (gastroesophageal reflux disease)   . Glaucoma   . Hemiplegia following CVA (cerebrovascular accident) ([place])    R side weakness  . Hyperlipidemia   . Hypertension   . Iron deficiency anemia   . Muscle weakness   . Urinary incontinence    Past Surgical History:  Past Surgical History:  Procedure Laterality Date  . CATARACT EXTRACTION Left   . CIRCUMCISION    . COLONOSCOPY N/A 08/23/2013   Procedure: COLONOSCOPY;  Surgeon: Gatha Mayer, MD;  Location: [facility];  Service: Endoscopy;  Laterality: N/A;  . ESOPHAGOGASTRODUODENOSCOPY N/A 08/23/2013   Procedure: ESOPHAGOGASTRODUODENOSCOPY (EGD);  Surgeon: Gatha Mayer, MD;  Location: [facility];  Service: Endoscopy;  Laterality: N/A;  . TONSILLECTOMY     HPI:  83 yo male adm to [facility] with 83 yo M NH resident presented with ileus and difficulty of breathing. CT abdomen confirmed ileus and blood work showed acute renal failure with signs of sepsis.  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  Pt had NG placed but it is now removed and he was placed on clears.  Pt was coughing with all intake yesterday per RN.  MBS conducted 2017 as an OP showed mild difficulties with no aspiration/penetration.     Assessment / Plan / Recommendation Clinical Impression  Pt presents with gross weakness including dysphonia and weak nonprotective cough.  He is having difficulties swallowing his saliva evidenced by delayed audible swallow fofllowed by overt coughing.  Pt would accept only 2 boluses with max encouragement repeating "I don't want anything"  .  Delayed oral transiting suspected with post=swallow delayed weak congested cough.  Recommend npo with oral care- pt did not answer SLP questions regarding if baseline deficits present and no family present at this time.  Set up oral suction and posted signs for precautions.  Will follow - RN informed.  SLP Visit Diagnosis: Dysphagia, oropharyngeal phase (R13.12)    Aspiration Risk  Severe aspiration risk;Risk for inadequate nutrition/hydration    Diet Recommendation NPO (oral moisture via toothette)   Medication Administration: Via alternative means    Other  Recommendations Oral Care Recommendations: Oral care QID   Follow up Recommendations None      Frequency and Duration min 2x/week  2 weeks       Prognosis Prognosis for Safe Diet Advancement: Guarded Barriers to Reach Goals: Severity of deficits      Swallow Study   General Date of Onset: 03/01/17 HPI: 83 yo male adm to [facility] with 83 yo M NH resident presented with ileus and difficulty of breathing. CT abdomen confirmed ileus and blood work showed acute renal failure with signs of sepsis.  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  Pt had NG placed but it is now removed and he was placed on clears.  Pt was coughing with all intake yesterday per RN.  MBS conducted 2017 as an OP showed mild difficulties with no aspiration/penetration.   Type of Study: Bedside Swallow Evaluation Diet Prior to this Study: Thin liquids (clears) Temperature Spikes Noted: Yes Respiratory Status: Nasal cannula (4  liters) History of Recent Intubation: No Behavior/Cognition: Alert;Cooperative;Pleasant mood Oral Cavity Assessment: Excessive secretions Oral Care Completed by SLP: Yes Oral Cavity - Dentition: Missing dentition Vision: Functional for self-feeding Self-Feeding Abilities: Total assist Patient Positioning: Upright in bed Baseline Vocal Quality: Suspected CN X (Vagus) involvement;Low vocal  intensity;Hoarse Volitional Cough: Weak Volitional Swallow: Unable to elicit    Oral/Motor/Sensory Function Overall Oral Motor/Sensory Function: Generalized oral weakness   Ice Chips Ice chips: Not tested   Thin Liquid Thin Liquid: Impaired Presentation: Spoon Oral Phase Impairments: Reduced labial seal;Reduced lingual movement/coordination Oral Phase Functional Implications: Prolonged oral transit Pharyngeal  Phase Impairments: Suspected delayed Swallow;Cough - Delayed    Nectar Thick Nectar Thick Liquid: Impaired Presentation: Spoon Oral Phase Impairments: Reduced labial seal Oral phase functional implications: Prolonged oral transit Pharyngeal Phase Impairments: Suspected delayed Swallow;Cough - Delayed   Honey Thick Honey Thick Liquid: Not tested Other Comments: pt declined to consume   Puree Puree: Not tested   Solid   GO   Solid: Not tested        Derek Crawford 03/01/2017,11:04 AM  Derek Crawford, [facility] SLP [phone]
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
## 2017-03-01 NOTE — Evaluation (Addendum)
Physical Therapy Evaluation Patient Details Name: Derek Crawford MRN: [id number] DOB: [birth year] Today's Date: 03/01/2017   History of Present Illness  83 yo M NH resident presented with ileus and difficulty of breathing. CT abdomen confirmed ileus and blood work showed acute renal failure with signs of sepsis.  PMH of R hemiparesis after CVA, DM, GERD  Clinical Impression  Pt admitted with above diagnosis. Pt currently with functional limitations due to the deficits listed below (see PT Problem List).  Pt will benefit from skilled PT to increase their independence and safety with mobility to allow discharge to the venue listed below.  Pt lethargic at time of PT eval, but was able to state that he would A in rolling in the bed, use a mechanical lift, and was able to propel self at w/c at SNF.  Pt currently too weak to A with rolling.  Recommend SNF.     Follow Up Recommendations SNF    Equipment Recommendations  None recommended by PT    Recommendations for Other Services       Precautions / Restrictions Precautions Precautions: Fall Restrictions Weight Bearing Restrictions: No      Mobility  Bed Mobility Overal bed mobility: Needs Assistance Bed Mobility: Rolling Rolling: Total assist         General bed mobility comments: Pt attempted to grab rail with B UE, but too weak to get L UE across body  Transfers                 General transfer comment: Pt too lethargic and weak to attempt  Ambulation/Gait                Stairs            Wheelchair Mobility    Modified Rankin (Stroke Patients Only)       Balance Overall balance assessment: Needs assistance                                           Pertinent Vitals/Pain      Home Living Family/patient expects to be discharged to:: Skilled nursing facility                      Prior Function Level of Independence: Needs assistance   Gait / Transfers Assistance  Needed: Pt reports he was able to A with rolling in the bed, staff used mechanical lift to get him in w/c, and he was able to propel self in w/c           Hand Dominance        Extremity/Trunk Assessment   Upper Extremity Assessment Upper Extremity Assessment: Generalized weakness (Pt did squeeze L hand and did move B UE to attempt to grab rail for rolling)    Lower Extremity Assessment Lower Extremity Assessment: Generalized weakness (Pt would not move LE to command)       Communication      Cognition Arousal/Alertness: Lethargic Behavior During Therapy: Flat affect Overall Cognitive Status: No family/caregiver present to determine baseline cognitive functioning                                 General Comments: Pt lethargic.  He did wake up briefly to answer questions and for rolling mobility assessment, but quickly fell back  asleep.  Even during assessment he would need cues to stay awake.      General Comments      Exercises     Assessment/Plan    PT Assessment Patient needs continued PT services  PT Problem List Decreased strength;Decreased activity tolerance;Decreased mobility       PT Treatment Interventions Functional mobility training;Therapeutic activities;Therapeutic exercise;Balance training    PT Goals (Current goals can be found in the Care Plan section)  Acute Rehab PT Goals Patient Stated Goal: none stated PT Goal Formulation: Patient unable to participate in goal setting Time For Goal Achievement: 03/15/17 Potential to Achieve Goals: Fair    Frequency Min 2X/week   Barriers to discharge        Co-evaluation               AM-PAC PT "6 Clicks" Daily Activity  Outcome Measure Difficulty turning over in bed (including adjusting bedclothes, sheets and blankets)?: Unable Difficulty moving from lying on back to sitting on the side of the bed? : Unable Difficulty sitting down on and standing up from a chair with arms (e.g.,  wheelchair, bedside commode, etc,.)?: Unable Help needed moving to and from a bed to chair (including a wheelchair)?: Total Help needed walking in hospital room?: Total Help needed climbing 3-5 steps with a railing? : Total 6 Click Score: 6    End of Session   Activity Tolerance: Patient limited by lethargy Patient left: in bed;with call bell/phone within reach;with bed alarm set Nurse Communication: Mobility status PT Visit Diagnosis: Muscle weakness (generalized) (M62.81)    Time: 6387-5643 PT Time Calculation (min) (ACUTE ONLY): 14 min   Charges:   PT Evaluation $PT Eval Low Complexity: 1 Low     PT G Codes:        Meldrick Buttery L. Tamala Julian, [place] Pager [phone] 03/01/2017   Galen Manila 03/01/2017, 10:06 AM

## 2017-03-01 NOTE — Progress Notes (Signed)
[facility] Progress Note  Subjective:  No abdominal pain.  Nurse reports that he was coughing a lot with clears last night; speech to see him.  Still coughing quite a bit during my visit.  Nurse says that he is still passing clear gelatinous/mucus substance per rectum.  Objective:  Vital signs in last 24 hours: Temp:  [98.2 F (36.8 C)-98.8 F (37.1 C)] 98.2 F (36.8 C) (08/22 0500) Pulse Rate:  [60-73] 66 (08/22 0500) Resp:  [20-28] 24 (08/22 0500) BP: (118-150)/(37-65) 150/65 (08/22 0500) SpO2:  [92 %-100 %] 100 % (08/22 0500) Weight:  [195 lb 5.2 oz (88.6 kg)] 195 lb 5.2 oz (88.6 kg) (08/22 0415) Last BM Date: 03/01/17 General:  Alert, Well-developed, in NAD Heart:  Regular rate and rhythm; no murmurs Pulm:  Still with some crackles B/L. Abdomen:  Soft, minimal distention.  Bowel sounds present.  Non-tender.  Extremities:  Without edema. Neurologic:  Alert but does not respond much verbally.  Lab Results:  Recent Labs  02/27/17 0741 02/28/17 0944 03/01/17 0345  WBC 6.8 6.5 8.4  HGB 11.1* 10.5* 11.2*  HCT 33.9* 31.6* 34.8*  PLT 132* 124* 140*   BMET  Recent Labs  02/27/17 0741 02/28/17 0944 03/01/17 0345  NA 140 141 140  K 3.1* 3.0* 3.1*  CL 109 104 103  CO2 25 31 31   GLUCOSE 131* 129* 132*  BUN 10 12 13   CREATININE 0.85 0.91 0.84  CALCIUM 7.8* 8.0* 8.2*   LFT  Recent Labs  02/27/17 0741  PROT 5.2*  ALBUMIN 2.2*  AST 22  ALT 17  ALKPHOS 37*  BILITOT 0.6   Dg Abd 1 View  Result Date: 02/28/2017 CLINICAL DATA:  Follow-up ileus EXAM: ABDOMEN - 1 VIEW COMPARISON:  Supine abdominal radiograph dated February 27, 2017 FINDINGS: There remain loops of mildly distended gas-filled small and large bowel. The degree of large bowel distention has decreased somewhat. No free extraluminal gas collections are observed. There is no significant rectal gas. There degenerative changes of the lower lumbar spine. IMPRESSION: Decreased distention of  large bowel in the right lower quadrant of the abdomen may reflect a resolving sigmoid volvulus. Persistent gaseous distention of small-bowel loops consistent with ileus. Electronically Signed   By: David  Martinique M.D.   On: 02/28/2017 07:06   Assessment / Plan: *Ileus secondary to PNA, sepsis:  Improving.  Rectal tube is out and is still passing gelatinous/mucusy material per rectum.  Keep electrolytes WNL's, ideally should keep K+ 4 or greater (was 3.1 this AM). Reglan was discontinued today, 8/22. On TNA.  Coughing with clears. *Acute hypoxic respiratory failure due to aspiration PNA:  Extubated 8/20. *AKI: Resolved.  -Continue with present care. -Speech to perform swallow evaluation.   LOS: 7 days   ZEHR, JESSICA D.  03/01/2017, 9:26 AM  Pager number [phone]  ________________________________________________________________________  Velora Heckler GI MD note:  I personally examined the patient, reviewed the data and agree with the assessment and plan described above.    He failed his swallow evaluation, aspirating even oral secretions (which is an oropharyngeal issue).  Now made strict NPO.  His abd is more distended today than yesterday but not tender to examination.  I don't know if NG tube replacement will help this oropharygneal issue but it may help his bowel dilation, ileus that seems to be worsening again.  His very large HH which made NG placement very difficult last week and eventually it was placed using fluoroscopy guidance.  At  time of extubation, the NG tube was removed as well.  If he has any vomiting, NG will need to be replaced (with fluoro guidance).  I will plan on unprepped flex sigmoidoscopy tomorrow AM to decompress and also confirm no distal colon stricture as well.     Owens Loffler, MD [facility] Pager [phone]

## 2017-03-01 NOTE — Progress Notes (Signed)
Derek Crawford NOTE   Pharmacy Consult for TPN Indication: prolonged ileus  Patient Measurements: Height: 5\' 9"  (175.3 cm) Weight: 195 lb 5.2 oz (88.6 kg) IBW/kg (Calculated) : 70.7  AdjBW: 76kg  Body mass index is 28.84 kg/m.   HPI: 83 yo M admitted from NH on 02/22/17 with ileus and difficulty breathing.  He remains intubated.  He is not a candidate for tube feedings due to hiatal hernia/risk of aspiration.  He is having bowel sounds/loose stools & should be able to tolerate oral diet once extubated per Dr Derek Crawford.  Pharmacy consulted to start TPN while patient is NPO.   Significant events:  8/20 Extubated and NGT removed.  MD to consider trickle feeds, but no NGT/OGT in place at this time. 8/21 Changed from ICU to telemetry status, will resume lipids in TPN dosing.  Xray shows persistent SB ileus.  Insulin requirements past 24 hours: 11 units Novolog   Current Nutrition: CLD started 8/21  IVF: none  Central access:  CVC triple lumen placed 8/15 TPN start date: 8/18  ASSESSMENT                                                                                                          Today. 03/01/2017:   Glucose:  CBGs at goal (<150) with SSI coverage q4h.  Hx DM on Lantus 25 units/day PTA.    Electrolytes:  K and Phos low despite replacement yesterday. MD has ordered 6 runs KCl 10 mEq already this morning.  K+ goal 4 in setting of ileus.  Renal:  WNL, stable  LFTs:  WNL (8/20)  TGs:  122 (8/20)  Prealbumin:  9.6 (8/20)   NUTRITIONAL GOALS                                                                                             RD recs (8/20):  90-100 Protein, 1815-2085 Kcal Clinimix 5/15 at a goal rate of 83 ml/hr + 20% fat emulsion 240 ml/day to provide: 100 g of protein and 1894 kCals per day meeting 100% of protein and 100% of kCal needs   PLAN  Now: KPhos 20 mmol IV x1 (also provides ~ 29 mEq K+)  At 1800 today:  Continue Clinimix E 5/15 at 40 ml/hr - do not advance rate today d/t electrolyte abnormalities and risk for refeeding syndrome  20% fat emulsion at 59ml/hr over 12 hr. Reduced rate while Clinimix at reduced rate to keep lipids at ~30% of total daily calories.  Plan to advance as tolerated to the goal rate; advancing slowly d/t risk for refeeding syndrome.  TPN to contain standard multivitamins and trace elements daily.  Continue CBGs and moderate SSI q4h.   TPN lab panels on Mondays & Thursdays.  F/u tolerance to diet advancement.  Derek Crawford, PharmD, BCPS Pager: [phone] 03/01/2017 10:26 AM

## 2017-03-01 NOTE — Progress Notes (Signed)
PROGRESS NOTE    Derek Crawford  CNO:[id number] DOB: 1932/07/14 DOA: 02/22/2017 PCP: No primary care provider on file.  Brief Narrative: 83 year old chronically ill male from a nursing home with CVA and right hemiparesis, seizures,  diabetes, GERD, debility/wheelchair-bound was admitted to the ICU on 8/15 due to respiratory failure from aspiration pneumonia and colonic ileus. CT on admission showed diffuse ileus, suspected cholelithiasis, patchy bilateral airspace opacities was admitted to the ICU, intubated, treated with IV cefepime, also followed by GI and general surgery, treated with NG decompression as well as a rectal tube. Completed 8 days of cefepime on 8/21 -Transferred to [facility] service today 8/22  Assessment & Plan:   1. Acute hypoxic respiratory failure -Due to sepsis/aspiration pneumonia likely related to ileus -s/p VDRF -Improving, completed course of IV cefepime on 8/21 -SLP evaluation -Remains on TNA  2. Colonic ileus - treated conservatively with NG decompression and a rectal tube -Followed by general surgery in ICU -Appears to be improving, NG tube out and rectal tube removed on 8/21 -Trial of clears when mentation improves, pending repeat SLP assessment, suspect he may have some dysphagia related to his prior CVA and now exacerbated by VDRF -Remains on TNA-  3. History of CVAs/right hemiparesis  -resume ASA/statin when PO able  4. History of seizures  -Continue Depakote by IV   5. Metabolic encephalopathy  -secondary to #1 and 2  -also on scheduled Reglan and when necessary fentanyl in ICU which I will stop  - monitor clinically and will consider imaging if no improvement in 1-2 days   6. Diabetes mellitus ,  -lantus on hold, SSI   DVT prophylaxis: Hep SQ Code Status: Full Code Family Communication: None at bedside, called and d/w son [name] Disposition Plan: will need SNF at DC  Consultants:  GI CCS PCCM Transfer  Subjective: Somnolent, no distress,  not alert enough for PO  Objective: Vitals:   02/28/17 2112 03/01/17 0415 03/01/17 0500 03/01/17 1345  BP: (!) 147/64  (!) 150/65 135/60  Pulse: 73  66 75  Resp: (!) 28  (!) 24 18  Temp: 98.3 F (36.8 C)  98.2 F (36.8 C) 98.9 F (37.2 C)  TempSrc: Oral  Oral Axillary  SpO2: 100%  100% 100%  Weight:  88.6 kg (195 lb 5.2 oz)    Height:        Intake/Output Summary (Last 24 hours) at 03/01/17 1540 Last data filed at 03/01/17 1454  Gross per 24 hour  Intake          1471.67 ml  Output             1600 ml  Net          -128.33 ml   Filed Weights   02/27/17 0436 02/28/17 0533 03/01/17 0415  Weight: 92.6 kg (204 lb 2.3 oz) 92.4 kg (203 lb 11.3 oz) 88.6 kg (195 lb 5.2 oz)    Examination:  QHU:TMLYYTK, obese, chronically ill-appearing male, somnolent, arouses to verbal command lungs: Good air movement bilaterallyRhonchi at both bases CVS: RRR,No Gallops,Rubs or new Murmurs Abd: soft obese, mildly distended, nontender, bowel sounds diminished but present Extremities: No Cyanosis, Clubbing or edema Skin: no new rashes Neurology: Chronic right hemiparesis Psych: flat affect    Data Reviewed:   CBC:  Recent Labs Lab 02/25/17 0312 02/26/17 0412 02/27/17 0741 02/28/17 0944 03/01/17 0345  WBC 6.7 6.5 6.8 6.5 8.4  NEUTROABS 4.2  --  4.6  --   --   HGB  10.8* 12.3* 11.1* 10.5* 11.2*  HCT 32.8* 37.5* 33.9* 31.6* 34.8*  MCV 79.4 81.0 79.2 80.6 81.7  PLT 121* 132* 132* 124* 470*   Basic Metabolic Panel:  Recent Labs Lab 02/25/17 0440 02/26/17 0412 02/27/17 0741 02/28/17 0944 03/01/17 0345  NA 140 142 140 141 140  K 3.5 3.8 3.1* 3.0* 3.1*  CL 110 112* 109 104 103  CO2 25 25 25 31 31   GLUCOSE 84 130* 131* 129* 132*  BUN 15 10 10 12 13   CREATININE 0.92 0.84 0.85 0.91 0.84  CALCIUM 7.5* 8.0* 7.8* 8.0* 8.2*  MG 2.4 2.3 1.7 1.6* 1.8  PHOS 2.1* 1.8* 2.1* 2.2* 2.2*   GFR: Estimated Creatinine Clearance: 74.7 mL/min (by C-G formula based on SCr of 0.84  mg/dL). Liver Function Tests:  Recent Labs Lab 02/25/17 0440 02/26/17 0412 02/27/17 0741  AST 23 26 22   ALT 15* 18 17  ALKPHOS 38 42 37*  BILITOT 0.7 0.5 0.6  PROT 5.4* 5.8* 5.2*  ALBUMIN 2.3* 2.5* 2.2*   No results for input(s): LIPASE, AMYLASE in the last 168 hours. No results for input(s): AMMONIA in the last 168 hours. Coagulation Profile: No results for input(s): INR, PROTIME in the last 168 hours. Cardiac Enzymes:  Recent Labs Lab 02/22/17 2145 02/23/17 0314 02/23/17 0816  TROPONINI 0.06* 0.05* 0.04*   BNP (last 3 results) No results for input(s): PROBNP in the last 8760 hours. HbA1C: No results for input(s): HGBA1C in the last 72 hours. CBG:  Recent Labs Lab 02/28/17 2023 03/01/17 0017 03/01/17 0336 03/01/17 0758 03/01/17 1201  GLUCAP 120* 143* 127* 139* 112*   Lipid Profile:  Recent Labs  02/27/17 0741  TRIG 122   Thyroid Function Tests: No results for input(s): TSH, T4TOTAL, FREET4, T3FREE, THYROIDAB in the last 72 hours. Anemia Panel: No results for input(s): VITAMINB12, FOLATE, FERRITIN, TIBC, IRON, RETICCTPCT in the last 72 hours. Urine analysis:    Component Value Date/Time   COLORURINE AMBER (A) 02/22/2017 0817   APPEARANCEUR CLOUDY (A) 02/22/2017 0817   LABSPEC 1.024 02/22/2017 0817   PHURINE 5.0 02/22/2017 0817   GLUCOSEU NEGATIVE 02/22/2017 0817   HGBUR LARGE (A) 02/22/2017 0817   BILIRUBINUR NEGATIVE 02/22/2017 0817   KETONESUR 5 (A) 02/22/2017 0817   PROTEINUR 100 (A) 02/22/2017 0817   UROBILINOGEN 0.2 10/04/2013 2134   NITRITE NEGATIVE 02/22/2017 0817   LEUKOCYTESUR TRACE (A) 02/22/2017 0817   Sepsis Labs: @LABRCNTIP (procalcitonin:4,lacticidven:4)  ) Recent Results (from the past 240 hour(s))  Culture, blood (Routine x 2)     Status: Abnormal   Collection Time: 02/22/17  2:47 AM  Result Value Ref Range Status   Specimen Description BLOOD LEFT ANTECUBITAL  Final   Special Requests   Final    BOTTLES DRAWN AEROBIC AND  ANAEROBIC Blood Culture results may not be optimal due to an excessive volume of blood received in culture bottles   Culture  Setup Time   Final    [place] TO, READ BACK BY AND VERIFIED WITH: T GREEN,PHARMD AT [place] 02/26/17 BY L BENFIELD    Culture (A)  Final    CORYNEBACTERIUM SPECIES THE SIGNIFICANCE OF ISOLATING THIS ORGANISM FROM A SINGLE SET OF BLOOD CULTURES WHEN MULTIPLE SETS ARE DRAWN IS UNCERTAIN. PLEASE NOTIFY THE MICROBIOLOGY DEPARTMENT WITHIN ONE WEEK IF SPECIATION AND SENSITIVITIES ARE REQUIRED. Performed at [facility], [place] [address] [place], [place] [address]    Report Status 02/27/2017 FINAL  Final  Blood Culture ID Panel (Reflexed)     Status: None   Collection Time: 02/22/17  2:47 AM  Result Value Ref Range Status   Enterococcus species NOT DETECTED NOT DETECTED Final   Vancomycin resistance NOT DETECTED NOT DETECTED Final   Listeria monocytogenes NOT DETECTED NOT DETECTED Final   Staphylococcus species NOT DETECTED NOT DETECTED Final   Staphylococcus aureus NOT DETECTED NOT DETECTED Final   Methicillin resistance NOT DETECTED NOT DETECTED Final   Streptococcus species NOT DETECTED NOT DETECTED Final   Streptococcus agalactiae NOT DETECTED NOT DETECTED Final   Streptococcus pneumoniae NOT DETECTED NOT DETECTED Final   Streptococcus pyogenes NOT DETECTED NOT DETECTED Final   Acinetobacter baumannii NOT DETECTED NOT DETECTED Final   Enterobacteriaceae species NOT DETECTED NOT DETECTED Final   Enterobacter cloacae complex NOT DETECTED NOT DETECTED Final   Escherichia coli NOT DETECTED NOT DETECTED Final   Klebsiella oxytoca NOT DETECTED NOT DETECTED Final   Klebsiella pneumoniae NOT DETECTED NOT DETECTED Final   Proteus species NOT DETECTED NOT DETECTED Final   Serratia marcescens NOT DETECTED NOT DETECTED Final   Carbapenem resistance NOT DETECTED NOT DETECTED Final   Haemophilus influenzae NOT DETECTED  NOT DETECTED Final   Neisseria meningitidis NOT DETECTED NOT DETECTED Final   Pseudomonas aeruginosa NOT DETECTED NOT DETECTED Final   Candida albicans NOT DETECTED NOT DETECTED Final   Candida glabrata NOT DETECTED NOT DETECTED Final   Candida krusei NOT DETECTED NOT DETECTED Final   Candida parapsilosis NOT DETECTED NOT DETECTED Final   Candida tropicalis NOT DETECTED NOT DETECTED Final    Comment: Performed at [facility], [address], [place], [place] [address]  Culture, blood (Routine x 2)     Status: None   Collection Time: 02/22/17  3:02 AM  Result Value Ref Range Status   Specimen Description BLOOD BLOOD RIGHT ARM  Final   Special Requests   Final    BOTTLES DRAWN AEROBIC AND ANAEROBIC Blood Culture results may not be optimal due to an excessive volume of blood received in culture bottles   Culture   Final    NO GROWTH 5 DAYS Performed at [facility], [place] [address] [place], [place] [address]    Report Status 02/27/2017 FINAL  Final  MRSA PCR Screening     Status: None   Collection Time: 02/22/17  9:20 AM  Result Value Ref Range Status   MRSA by PCR NEGATIVE NEGATIVE Final    Comment:        The GeneXpert MRSA Assay (FDA approved for NASAL specimens only), is one component of a comprehensive MRSA colonization surveillance program. It is not intended to diagnose MRSA infection nor to guide or monitor treatment for MRSA infections.   Culture, Urine     Status: None   Collection Time: 02/22/17  6:00 PM  Result Value Ref Range Status   Specimen Description URINE, CATHETERIZED  Final   Special Requests NONE  Final   Culture   Final    NO GROWTH Performed at [facility], [address], [place], [place] [address]    Report Status 02/24/2017 FINAL  Final  C difficile quick scan w PCR reflex     Status: None   Collection Time: 02/22/17  6:00 PM  Result Value Ref Range Status   C Diff antigen NEGATIVE NEGATIVE Final   C Diff toxin NEGATIVE  NEGATIVE Final   C Diff interpretation No C. difficile detected.  Final  Radiology Studies: Dg Abd 1 View  Result Date: 02/28/2017 CLINICAL DATA:  Follow-up ileus EXAM: ABDOMEN - 1 VIEW COMPARISON:  Supine abdominal radiograph dated February 27, 2017 FINDINGS: There remain loops of mildly distended gas-filled small and large bowel. The degree of large bowel distention has decreased somewhat. No free extraluminal gas collections are observed. There is no significant rectal gas. There degenerative changes of the lower lumbar spine. IMPRESSION: Decreased distention of large bowel in the right lower quadrant of the abdomen may reflect a resolving sigmoid volvulus. Persistent gaseous distention of small-bowel loops consistent with ileus. Electronically Signed   By: David  Martinique M.D.   On: 02/28/2017 07:06        Scheduled Meds: . Chlorhexidine Gluconate Cloth  6 each Topical Daily  . furosemide  40 mg Intravenous Daily  . heparin  5,000 Units Subcutaneous Q8H  . insulin aspart  0-15 Units Subcutaneous Q4H  . latanoprost  1 drop Both Eyes QHS  . mouth rinse  15 mL Mouth Rinse QID  . sodium chloride flush  10-40 mL Intracatheter Q12H   Continuous Infusions: . sodium chloride Stopped (02/24/17 1600)  . Marland KitchenTPN (CLINIMIX-E) Adult     And  . fat emulsion    . potassium chloride Stopped (03/01/17 1504)  . potassium PHOSPHATE IVPB (mmol) 20 mmol (03/01/17 1200)  . Marland KitchenTPN (CLINIMIX-E) Adult 40 mL/hr at 02/28/17 1752  . valproate sodium Stopped (03/01/17 1504)     LOS: 7 days    Time spent: 30min    Domenic Polite, MD [facility] Page via [email or web address], password [email or web address]  If 7PM-7AM, please contact night-coverage [email or web address] Password [facility] 03/01/2017, 3:40 PM

## 2017-03-01 NOTE — Progress Notes (Signed)
OT Cancellation Note  Patient Details Name: Diyari Cherne MRN: [id number] DOB: 18-May-1933   Cancelled Treatment:    Reason Eval/Treat Not Completed: Other (comment). Noted pt is from SNF with plan to return to SNF. Will defer OT evaluation to that venue.  Mindy Gali 03/01/2017, 12:44 PM  Lesle Chris, OTR/L [phone] 03/01/2017

## 2017-03-01 NOTE — NC FL2 (Signed)
[place] LEVEL OF CARE SCREENING TOOL     IDENTIFICATION  Patient Name: Derek Crawford Birthdate: 06/14/1933 Sex: male Admission Date (Current Location): 02/22/2017  [facility] and [place] Number:  Herbalist and Address:  [facility],  [place], [place]      Provider Number: [id number]  Attending Physician Name and Address:  Domenic Polite, MD  Relative Name and Phone Number:       Current Level of Care: Hospital Recommended Level of Care: [place] Prior Approval Number:    Date Approved/Denied: 03/01/17 PASRR Number: [id number]  Discharge Plan: SNF    Current Diagnoses: Patient Active Problem List   Diagnosis Date Noted  . Respiratory failure ([place]) 02/22/2017  . Ileus ([place])   . Encounter for central line placement   . Depressed mood 12/09/2013  . Allergic rhinitis 12/09/2013  . Psychosis 10/21/2013  . BPH (benign prostatic hyperplasia) 09/16/2013  . Large Tubular adenoma right colon 08/23/2013  . Unspecified constipation 08/07/2013  . Hypertension   . Hyperlipidemia   . Urinary incontinence   . Iron deficiency anemia 05/22/2013  . Dyslipidemia 05/22/2013  . Hemiplegia ([place]) 04/10/2007  . Coronary atherosclerosis 04/10/2007  . CVA 04/10/2007  . GERD 04/10/2007  . GLAUCOMA, HX OF 04/10/2007  . PERCUTANEOUS TRANSLUMINAL CORONARY ANGIOPLASTY, HX OF 04/10/2007    Orientation RESPIRATION BLADDER Height & Weight     Self, Place  O2 (4L) Indwelling catheter Weight: 195 lb 5.2 oz (88.6 kg) Height:  5\' 9"  (175.3 cm)  BEHAVIORAL SYMPTOMS/MOOD NEUROLOGICAL BOWEL NUTRITION STATUS      Incontinent TNA (TPN)  AMBULATORY STATUS COMMUNICATION OF NEEDS Skin   Extensive Assist Verbally Normal                       Personal Care Assistance Level of Assistance  Bathing, Feeding, Dressing Bathing Assistance: Maximum assistance Feeding assistance: Limited assistance Dressing Assistance: Maximum  assistance     Functional Limitations Info  Sight, Hearing, Speech Sight Info: Adequate Hearing Info: Adequate Speech Info: Impaired (pt experiences periods of being nonverbal)    SPECIAL CARE FACTORS FREQUENCY  Speech therapy, PT (By licensed PT)     PT Frequency: 5x       Speech Therapy Frequency: 2x      Contractures Contractures Info: Not present    Additional Factors Info  Allergies, Code Status Code Status Info: full code Allergies Info: nka           Current Medications (03/01/2017):  This is the current hospital active medication list Current Facility-Administered Medications  Medication Dose Route Frequency Provider Last Rate Last Dose  . 0.9 %  sodium chloride infusion  250 mL Intravenous PRN Judeth Porch, MD   Stopped at 02/24/17 1600  . albuterol (PROVENTIL) (2.5 MG/3ML) 0.083% nebulizer solution 2.5 mg  2.5 mg Nebulization Q6H PRN Mannam, Praveen, MD      . Chlorhexidine Gluconate Cloth 2 % PADS 6 each  6 each Topical Daily Rigoberto Noel, MD   6 each at 02/27/17 [phone]  . TPN (CLINIMIX-E) Adult   Intravenous Continuous TPN Dara Hoyer, RPH       And  . fat emulsion 20 % infusion 120 mL  120 mL Intravenous Continuous TPN Dara Hoyer, RPH      . furosemide (LASIX) injection 40 mg  40 mg Intravenous Daily Chesley Mires, MD   40 mg at 03/01/17 0924  . heparin  injection 5,000 Units  5,000 Units Subcutaneous Q8H Judeth Porch, MD   5,000 Units at 03/01/17 1405  . insulin aspart (novoLOG) injection 0-15 Units  0-15 Units Subcutaneous Q4H Chesley Mires, MD   2 Units at 03/01/17 0809  . latanoprost (XALATAN) 0.005 % ophthalmic solution 1 drop  1 drop Both Eyes QHS Chesley Mires, MD   1 drop at 02/28/17 2258  . MEDLINE mouth rinse  15 mL Mouth Rinse QID Rigoberto Noel, MD   15 mL at 03/01/17 1200  . potassium chloride 10 mEq in 100 mL IVPB  10 mEq Intravenous Q1 Hr x 6 Domenic Polite, MD 100 mL/hr at 03/01/17 1332 10 mEq at 03/01/17 1332  . potassium  PHOSPHATE 20 mmol in dextrose 5 % 500 mL infusion  20 mmol Intravenous Once Dara Hoyer, RPH 84 mL/hr at 03/01/17 1200 20 mmol at 03/01/17 1200  . sodium chloride flush (NS) 0.9 % injection 10-40 mL  10-40 mL Intracatheter Q12H Rigoberto Noel, MD   10 mL at 02/28/17 0800  . sodium chloride flush (NS) 0.9 % injection 10-40 mL  10-40 mL Intracatheter PRN Rigoberto Noel, MD      . TPN (CLINIMIX-E) Adult   Intravenous Continuous TPN Randa Spike, RPH 40 mL/hr at 02/28/17 1752    . valproate (DEPACON) 500 mg in dextrose 5 % 50 mL IVPB  500 mg Intravenous Q8H Desai, Rahul P, PA-C 55 mL/hr at 03/01/17 1405 500 mg at 03/01/17 1405     Discharge Medications: Please see discharge summary for a list of discharge medications.  Relevant Imaging Results:  Relevant Lab Results:   Additional Information SS# [id number], Medicaid #[id number] Raynelle Jan, LCSW

## 2017-03-02 ENCOUNTER — Inpatient Hospital Stay (HOSPITAL_COMMUNITY): Payer: Medicare Other | Admitting: Certified Registered Nurse Anesthetist

## 2017-03-02 ENCOUNTER — Encounter (HOSPITAL_COMMUNITY)
Admission: EM | Disposition: A | Discharge status: Skilled Nursing Facility | Payer: Self-pay | Source: Home / Self Care | Attending: Pulmonary Disease

## 2017-03-02 ENCOUNTER — Encounter (HOSPITAL_COMMUNITY): Payer: Self-pay | Admitting: Certified Registered Nurse Anesthetist

## 2017-03-02 HISTORY — PX: FLEXIBLE SIGMOIDOSCOPY: SHX5431

## 2017-03-02 LAB — COMPREHENSIVE METABOLIC PANEL
ALT: 20 U/L (ref 17–63)
AST: 25 U/L (ref 15–41)
Albumin: 2.3 g/dL — ABNORMAL LOW (ref 3.5–5.0)
Alkaline Phosphatase: 39 U/L (ref 38–126)
Anion gap: 4 — ABNORMAL LOW (ref 5–15)
BUN: 13 mg/dL (ref 6–20)
CO2: 33 mmol/L — ABNORMAL HIGH (ref 22–32)
Calcium: 8 mg/dL — ABNORMAL LOW (ref 8.9–10.3)
Chloride: 102 mmol/L (ref 101–111)
Creatinine, Ser: 0.84 mg/dL (ref 0.61–1.24)
Glucose, Bld: 133 mg/dL — ABNORMAL HIGH (ref 65–99)
POTASSIUM: 3.3 mmol/L — AB (ref 3.5–5.1)
Sodium: 139 mmol/L (ref 135–145)
TOTAL PROTEIN: 5.6 g/dL — AB (ref 6.5–8.1)
Total Bilirubin: 0.2 mg/dL — ABNORMAL LOW (ref 0.3–1.2)

## 2017-03-02 LAB — PHOSPHORUS: PHOSPHORUS: 2.4 mg/dL — AB (ref 2.5–4.6)

## 2017-03-02 LAB — GLUCOSE, CAPILLARY
GLUCOSE-CAPILLARY: 138 mg/dL — AB (ref 65–99)
GLUCOSE-CAPILLARY: 134 mg/dL — AB (ref 65–99)
GLUCOSE-CAPILLARY: 151 mg/dL — AB (ref 65–99)
Glucose-Capillary: 118 mg/dL — ABNORMAL HIGH (ref 65–99)
Glucose-Capillary: 138 mg/dL — ABNORMAL HIGH (ref 65–99)
Glucose-Capillary: 127 mg/dL — ABNORMAL HIGH (ref 65–99)

## 2017-03-02 LAB — MAGNESIUM: MAGNESIUM: 1.6 mg/dL — AB (ref 1.7–2.4)

## 2017-03-02 SURGERY — SIGMOIDOSCOPY, FLEXIBLE
Anesthesia: Monitor Anesthesia Care

## 2017-03-02 MED ORDER — POTASSIUM PHOSPHATES 15 MMOLE/5ML IV SOLN
30.0000 mmol | Freq: Once | INTRAVENOUS | Status: AC
  Administered 2017-03-02: 30 mmol via INTRAVENOUS
  Filled 2017-03-02: qty 10

## 2017-03-02 MED ORDER — LACTATED RINGERS IV SOLN
INTRAVENOUS | Status: DC | PRN
  Administered 2017-03-02: 10:00:00 via INTRAVENOUS

## 2017-03-02 MED ORDER — LIDOCAINE HCL (CARDIAC) 20 MG/ML IV SOLN
INTRAVENOUS | Status: DC | PRN
  Administered 2017-03-02: 100 mg via INTRATRACHEAL

## 2017-03-02 MED ORDER — PROPOFOL 10 MG/ML IV BOLUS
INTRAVENOUS | Status: DC | PRN
  Administered 2017-03-02: 10 mg via INTRAVENOUS
  Administered 2017-03-02: 20 mg via INTRAVENOUS

## 2017-03-02 MED ORDER — M.V.I. ADULT IV INJ
INJECTION | INTRAVENOUS | Status: AC
  Administered 2017-03-02: 17:00:00 via INTRAVENOUS
  Filled 2017-03-02: qty 1200

## 2017-03-02 MED ORDER — POTASSIUM CHLORIDE 10 MEQ/100ML IV SOLN
10.0000 meq | INTRAVENOUS | Status: AC
  Administered 2017-03-02 (×2): 10 meq via INTRAVENOUS
  Filled 2017-03-02 (×2): qty 100

## 2017-03-02 MED ORDER — PROPOFOL 10 MG/ML IV BOLUS
INTRAVENOUS | Status: AC
  Filled 2017-03-02: qty 40

## 2017-03-02 MED ORDER — POTASSIUM CHLORIDE 10 MEQ/100ML IV SOLN
10.0000 meq | INTRAVENOUS | Status: AC
  Administered 2017-03-02 (×2): 10 meq via INTRAVENOUS
  Filled 2017-03-02 (×4): qty 100

## 2017-03-02 MED ORDER — MAGNESIUM SULFATE 2 GM/50ML IV SOLN
2.0000 g | Freq: Once | INTRAVENOUS | Status: AC
  Administered 2017-03-02: 2 g via INTRAVENOUS
  Filled 2017-03-02: qty 50

## 2017-03-02 MED ORDER — FAT EMULSION 20 % IV EMUL
120.0000 mL | INTRAVENOUS | Status: AC
  Administered 2017-03-02: 120 mL via INTRAVENOUS
  Filled 2017-03-02: qty 250

## 2017-03-02 MED ORDER — SODIUM CHLORIDE 0.9 % IV SOLN: INTRAVENOUS | Status: DC

## 2017-03-02 MED ORDER — LIDOCAINE 2% (20 MG/ML) 5 ML SYRINGE
INTRAMUSCULAR | Status: AC
  Filled 2017-03-02: qty 5

## 2017-03-02 NOTE — Progress Notes (Signed)
CSW attempted to meet with pt to discuss SNF bed offers- pt in procedure. Spoke with pt's son via phone and provided bed offers. Son states he will visit facilities and made selection.  Sharren Bridge, MSW, LCSW Clinical Social Work 03/02/2017 [phone]

## 2017-03-02 NOTE — Progress Notes (Addendum)
[place] NOTE   Pharmacy Consult for TPN Indication: prolonged ileus  Patient Measurements: Height: 5\' 9"  (175.3 cm) Weight: 195 lb 5.2 oz (88.6 kg) IBW/kg (Calculated) : 70.7  AdjBW: 76kg  Body mass index is 28.84 kg/m.   HPI: 83 yo M admitted from NH on 02/22/17 with acute respiratory failure due from aspiration pneumonia and colonic ileus.  Intubated 8/15-20, ileus treated with NG decompression.  He is not a candidate for tube feedings due to hiatal hernia/risk of aspiration.   Pharmacy consulted to start TPN while patient is NPO.   Significant events:  8/20 Extubated and NGT removed.  MD to consider trickle feeds, but no NGT/OGT in place at this time. 8/21 Changed from ICU to telemetry status, will resume lipids in TPN dosing.  Xray shows persistent SB ileus. 8/23 Flex sigmoidoscopy showed no distal obstructing lesions  Insulin requirements past 24 hours: 7 units Novolog   Current Nutrition: NPO, evaluated by SLP 8/22 as severe aspiration risk  IVF: none  Central access:  CVC triple lumen placed 8/15 TPN start date: 8/18  ASSESSMENT                                                                                                          Today. 03/02/2017:   Glucose:  CBGs at goal (<150) with SSI coverage q4h.  Hx DM on Lantus 25 units/day PTA.    Electrolytes:  K+, Mag, Phos with some improvement but remain low despite replacement.  Patient still passing clear gelatinous/mucus substance per rectum.  Electrolytes low prior to starting TPN and continue to remain low despite aggressive replacement.  Have held TPN at half of goal rate due to continued electrolyte abnormalities.  Will continue aggressive replacement and will advance TPN rate slightly today.  Patient is at risk for refeeding but difficult to differentiate from GI losses of electrolytes.  Renal:  WNL, stable  LFTs:  WNL (8/20)  TGs:  122 (8/20)  Prealbumin:  9.6  (8/20)   NUTRITIONAL GOALS                                                                                             RD recs (8/20):  90-100 Protein, 1815-2085 Kcal Clinimix 5/15 at a goal rate of 83 ml/hr + 20% fat emulsion 240 ml/day to provide: 100 g of protein and 1894 kCals per day meeting 100% of protein and 100% of kCal needs   PLAN  Now: KPhos 30 mmol IV x1 (also provides ~ 44 mEq K+ in addition to 4 runs of KCl 61mEq IVPB ordered by MD today for total of 84 mEq KCl today) Magnesium sulfate 2g IVPB x 1  At 1800 today:  Advance Clinimix E 5/15 slightly to 50 ml/hr.  20% fat emulsion at 67ml/hr over 12 hr. Reduced rate while Clinimix at reduced rate to keep lipids at ~30% of total daily calories.  Plan to advance as tolerated to the goal rate; advancing slowly d/t risk for refeeding syndrome.  TPN to contain standard multivitamins and trace elements daily.  Continue CBGs and moderate SSI q4h.   TPN lab panels on Mondays & Thursdays.  BMET, Mag, Phos in AM.  Hershal Coria, PharmD, BCPS Pager: [phone] 03/02/2017 11:53 AM

## 2017-03-02 NOTE — Progress Notes (Signed)
I have received report from the out going nurse and agree with her assessment. I will continue to monitor.

## 2017-03-02 NOTE — Anesthesia Postprocedure Evaluation (Signed)
Anesthesia Post Note  Patient: Derek Crawford  Procedure(s) Performed: Procedure(s) (LRB): FLEXIBLE SIGMOIDOSCOPY (N/A)     Patient location during evaluation: Endoscopy Anesthesia Type: MAC Level of consciousness: awake and alert Pain management: pain level controlled Vital Signs Assessment: post-procedure vital signs reviewed and stable Respiratory status: spontaneous breathing, nonlabored ventilation, respiratory function stable and patient connected to nasal cannula oxygen Cardiovascular status: stable and blood pressure returned to baseline Anesthetic complications: no    Last Vitals:  Vitals:   03/02/17 1128 03/02/17 1200  BP:  (!) 112/40  Pulse: (!) 59 (!) 51  Resp: 15   Temp:  37.1 C  SpO2: 99% 99%    Last Pain:  Vitals:   03/02/17 1200  TempSrc: Oral  PainSc:                  Shayle Donahoo,JAMES TERRILL

## 2017-03-02 NOTE — Transfer of Care (Signed)
Immediate Anesthesia Transfer of Care Note  Patient: Derek Crawford  Procedure(s) Performed: Procedure(s): FLEXIBLE SIGMOIDOSCOPY (N/A)  Patient Location: PACU and Endoscopy Unit  Anesthesia Type:MAC  Level of Consciousness: awake alert  Airway & Oxygen Therapy: Patient Spontanous Breathing and Patient connected to nasal cannula oxygen  Post-op Assessment: Report given to RN and Post -op Vital signs reviewed and stable  Post vital signs: Reviewed and stable  Last Vitals:  Vitals:   03/02/17 0800 03/02/17 1016  BP: (!) 115/48 (!) 118/45  Pulse: 62 65  Resp: 20 (!) 25  Temp: 37.8 C 36.7 C  SpO2: 100% 97%    Last Pain:  Vitals:   03/02/17 1016  TempSrc: Oral  PainSc:          Complications: No apparent anesthesia complications

## 2017-03-02 NOTE — Interval H&P Note (Signed)
History and Physical Interval Note:  03/02/2017 10:24 AM  Derek Crawford  has presented today for surgery, with the diagnosis of ileus  The various methods of treatment have been discussed with the patient and family. After consideration of risks, benefits and other options for treatment, the patient has consented to  Procedure(s): FLEXIBLE SIGMOIDOSCOPY (N/A) as a surgical intervention .  The patient's history has been reviewed, patient examined, no change in status, stable for surgery.  I have reviewed the patient's chart and labs.  Questions were answered to the patient's satisfaction.     [name]

## 2017-03-02 NOTE — H&P (View-Only) (Signed)
[facility] Progress Note  Subjective:  No abdominal pain.  Nurse reports that he was coughing a lot with clears last night; speech to see him.  Still coughing quite a bit during my visit.  Nurse says that he is still passing clear gelatinous/mucus substance per rectum.  Objective:  Vital signs in last 24 hours: Temp:  [98.2 F (36.8 C)-98.8 F (37.1 C)] 98.2 F (36.8 C) (08/22 0500) Pulse Rate:  [60-73] 66 (08/22 0500) Resp:  [20-28] 24 (08/22 0500) BP: (118-150)/(37-65) 150/65 (08/22 0500) SpO2:  [92 %-100 %] 100 % (08/22 0500) Weight:  [195 lb 5.2 oz (88.6 kg)] 195 lb 5.2 oz (88.6 kg) (08/22 0415) Last BM Date: 03/01/17 General:  Alert, Well-developed, in NAD Heart:  Regular rate and rhythm; no murmurs Pulm:  Still with some crackles B/L. Abdomen:  Soft, minimal distention.  Bowel sounds present.  Non-tender.  Extremities:  Without edema. Neurologic:  Alert but does not respond much verbally.  Lab Results:  Recent Labs  02/27/17 0741 02/28/17 0944 03/01/17 0345  WBC 6.8 6.5 8.4  HGB 11.1* 10.5* 11.2*  HCT 33.9* 31.6* 34.8*  PLT 132* 124* 140*   BMET  Recent Labs  02/27/17 0741 02/28/17 0944 03/01/17 0345  NA 140 141 140  K 3.1* 3.0* 3.1*  CL 109 104 103  CO2 25 31 31   GLUCOSE 131* 129* 132*  BUN 10 12 13   CREATININE 0.85 0.91 0.84  CALCIUM 7.8* 8.0* 8.2*   LFT  Recent Labs  02/27/17 0741  PROT 5.2*  ALBUMIN 2.2*  AST 22  ALT 17  ALKPHOS 37*  BILITOT 0.6   Dg Abd 1 View  Result Date: 02/28/2017 CLINICAL DATA:  Follow-up ileus EXAM: ABDOMEN - 1 VIEW COMPARISON:  Supine abdominal radiograph dated February 27, 2017 FINDINGS: There remain loops of mildly distended gas-filled small and large bowel. The degree of large bowel distention has decreased somewhat. No free extraluminal gas collections are observed. There is no significant rectal gas. There degenerative changes of the lower lumbar spine. IMPRESSION: Decreased distention of  large bowel in the right lower quadrant of the abdomen may reflect a resolving sigmoid volvulus. Persistent gaseous distention of small-bowel loops consistent with ileus. Electronically Signed   By: David  Martinique M.D.   On: 02/28/2017 07:06   Assessment / Plan: *Ileus secondary to PNA, sepsis:  Improving.  Rectal tube is out and is still passing gelatinous/mucusy material per rectum.  Keep electrolytes WNL's, ideally should keep K+ 4 or greater (was 3.1 this AM). Reglan was discontinued today, 8/22. On TNA.  Coughing with clears. *Acute hypoxic respiratory failure due to aspiration PNA:  Extubated 8/20. *AKI: Resolved.  -Continue with present care. -Speech to perform swallow evaluation.   LOS: 7 days   ZEHR, JESSICA D.  03/01/2017, 9:26 AM  Pager number [phone]  ________________________________________________________________________  [facility] MD note:  I personally examined the patient, reviewed the data and agree with the assessment and plan described above.    He failed his swallow evaluation, aspirating even oral secretions (which is an oropharyngeal issue).  Now made strict NPO.  His abd is more distended today than yesterday but not tender to examination.  I don't know if NG tube replacement will help this oropharygneal issue but it may help his bowel dilation, ileus that seems to be worsening again.  His very large HH which made NG placement very difficult last week and eventually it was placed using fluoroscopy guidance.  At  time of extubation, the NG tube was removed as well.  If he has any vomiting, NG will need to be replaced (with fluoro guidance).  I will plan on unprepped flex sigmoidoscopy tomorrow AM to decompress and also confirm no distal colon stricture as well.     Owens Loffler, MD [facility] Pager [phone]

## 2017-03-02 NOTE — Progress Notes (Signed)
PROGRESS NOTE    Derek Crawford  MWN:[id number] DOB: [birth year] DOA: 02/22/2017 PCP: No primary care provider on file.  Brief Narrative: 83 year old chronically ill male from a nursing home with CVA and right hemiparesis, seizures,  diabetes, GERD, debility/wheelchair-bound was admitted to the ICU on 8/15 due to respiratory failure from aspiration pneumonia and colonic ileus. CT on admission showed diffuse ileus, suspected cholelithiasis, patchy bilateral airspace opacities was admitted to the ICU, intubated, treated with IV cefepime, also followed by GI and general surgery, treated with NG decompression as well as a rectal tube. Completed 8 days of cefepime on 8/21 -Transferred to [facility] service  8/22, failed swallow eval-high aspiration risk 8/22 with worsening ileus-Flex sig planned  Assessment & Plan:   1. Acute hypoxic respiratory failure -Due to sepsis/aspiration pneumonia likely related to ileus -s/p VDRF -Improving, completed course of IV cefepime on 8/21 -SLP evaluation -Remains on TNA  2. Colonic ileus - treated conservatively with NG decompression and a rectal tube -Followed by general surgery in ICU -was initially improving and NG tube out and rectal tube removed on 8/21 -then worsened again, GI planning flex sig to r/o obstruction today -trial of PO when mentation better and pending repeat SLP assessment, suspect he may have some dysphagia related to his prior CVA and now exacerbated by VDRF -Remains on TNA-  3. History of CVAs/right hemiparesis  -resume ASA/statin when PO able  4. History of seizures  -Continue Depakote by IV   5. Metabolic encephalopathy  -secondary to #1 and 2  -also on scheduled Reglan and when necessary fentanyl in ICU which I stopped - monitor clinically and will consider imaging if no improvement in 1-2 days   6. Diabetes mellitus ,  -lantus on hold, SSI  7. Dysphagia -multifactorial, see above, SLP to re-assess tomorrow   DVT  prophylaxis: Hep SQ Code Status: Full Code Family Communication: d/w son [name] Disposition Plan: will need SNF at [place]  Consultants:  GI CCS PCCM Transfer  Subjective:  awaiting flex sig this am, no vomiting, remains somnolent  Objective: Vitals:   03/02/17 1110 03/02/17 1120 03/02/17 1128 03/02/17 1200  BP: (!) 107/46 (!) 122/95  (!) 112/40  Pulse: 71 64 (!) 59 (!) 51  Resp: (!) 24 20 15    Temp:    98.8 F (37.1 C)  TempSrc:    Oral  SpO2: 98% 99% 99% 99%  Weight:      Height:        Intake/Output Summary (Last 24 hours) at 03/02/17 1359 Last data filed at 03/02/17 1107  Gross per 24 hour  Intake          1376.33 ml  Output             1325 ml  Net            51.33 ml   Filed Weights   02/27/17 0436 02/28/17 0533 03/01/17 0415  Weight: 92.6 kg (204 lb 2.3 oz) 92.4 kg (203 lb 11.3 oz) 88.6 kg (195 lb 5.2 oz)    Examination:  Gen: Elderly obese male, chronically ill appearing, slightly more lucid this morning HEENT: Pupils equal and reactive, no JVD Lungs:  decreased breath sounds at both bases, some scattered rhonchi  CVS: RRR,No Gallops,Rubs or new Murmurs Abd: soft, Non tender, non distended, BS present Extremities: No Cyanosis, Clubbing or edema Skin: no new rashes Neurology: Chronic right hemiparesis Psych: flat affect    Data Reviewed:   CBC:  Recent Labs Lab 02/25/17  2878 02/26/17 0412 02/27/17 0741 02/28/17 0944 03/01/17 0345  WBC 6.7 6.5 6.8 6.5 8.4  NEUTROABS 4.2  --  4.6  --   --   HGB 10.8* 12.3* 11.1* 10.5* 11.2*  HCT 32.8* 37.5* 33.9* 31.6* 34.8*  MCV 79.4 81.0 79.2 80.6 81.7  PLT 121* 132* 132* 124* 676*   Basic Metabolic Panel:  Recent Labs Lab 02/26/17 0412 02/27/17 0741 02/28/17 0944 03/01/17 0345 03/02/17 0415  NA 142 140 141 140 139  K 3.8 3.1* 3.0* 3.1* 3.3*  CL 112* 109 104 103 102  CO2 25 25 31 31  33*  GLUCOSE 130* 131* 129* 132* 133*  BUN 10 10 12 13 13   CREATININE 0.84 0.85 0.91 0.84 0.84  CALCIUM 8.0* 7.8*  8.0* 8.2* 8.0*  MG 2.3 1.7 1.6* 1.8 1.6*  PHOS 1.8* 2.1* 2.2* 2.2* 2.4*   GFR: Estimated Creatinine Clearance: 74.7 mL/min (by C-G formula based on SCr of 0.84 mg/dL). Liver Function Tests:  Recent Labs Lab 02/25/17 0440 02/26/17 0412 02/27/17 0741 03/02/17 0415  AST 23 26 22 25   ALT 15* 18 17 20   ALKPHOS 38 42 37* 39  BILITOT 0.7 0.5 0.6 0.2*  PROT 5.4* 5.8* 5.2* 5.6*  ALBUMIN 2.3* 2.5* 2.2* 2.3*   No results for input(s): LIPASE, AMYLASE in the last 168 hours. No results for input(s): AMMONIA in the last 168 hours. Coagulation Profile: No results for input(s): INR, PROTIME in the last 168 hours. Cardiac Enzymes: No results for input(s): CKTOTAL, CKMB, CKMBINDEX, TROPONINI in the last 168 hours. BNP (last 3 results) No results for input(s): PROBNP in the last 8760 hours. HbA1C: No results for input(s): HGBA1C in the last 72 hours. CBG:  Recent Labs Lab 03/01/17 2335 03/02/17 0448 03/02/17 0507 03/02/17 0749 03/02/17 1150  GLUCAP 131* 118* 138* 138* 127*   Lipid Profile: No results for input(s): CHOL, HDL, LDLCALC, TRIG, CHOLHDL, LDLDIRECT in the last 72 hours. Thyroid Function Tests: No results for input(s): TSH, T4TOTAL, FREET4, T3FREE, THYROIDAB in the last 72 hours. Anemia Panel: No results for input(s): VITAMINB12, FOLATE, FERRITIN, TIBC, IRON, RETICCTPCT in the last 72 hours. Urine analysis:    Component Value Date/Time   COLORURINE AMBER (A) 02/22/2017 0817   APPEARANCEUR CLOUDY (A) 02/22/2017 0817   LABSPEC 1.024 02/22/2017 0817   PHURINE 5.0 02/22/2017 0817   GLUCOSEU NEGATIVE 02/22/2017 0817   HGBUR LARGE (A) 02/22/2017 0817   BILIRUBINUR NEGATIVE 02/22/2017 0817   KETONESUR 5 (A) 02/22/2017 0817   PROTEINUR 100 (A) 02/22/2017 0817   UROBILINOGEN 0.2 10/04/2013 2134   NITRITE NEGATIVE 02/22/2017 0817   LEUKOCYTESUR TRACE (A) 02/22/2017 0817   Sepsis Labs: @LABRCNTIP (procalcitonin:4,lacticidven:4)  ) Recent Results (from the past 240  hour(s))  Culture, blood (Routine x 2)     Status: Abnormal   Collection Time: 02/22/17  2:47 AM  Result Value Ref Range Status   Specimen Description BLOOD LEFT ANTECUBITAL  Final   Special Requests   Final    BOTTLES DRAWN AEROBIC AND ANAEROBIC Blood Culture results may not be optimal due to an excessive volume of blood received in culture bottles   Culture  Setup Time   Final    GRAM POSITIVE RODS AEROBIC BOTTLE ONLY CRITICAL RESULT CALLED TO, READ BACK BY AND VERIFIED WITH: T GREEN,PHARMD AT [place] 02/26/17 BY L BENFIELD    Culture (A)  Final    CORYNEBACTERIUM SPECIES THE SIGNIFICANCE OF ISOLATING THIS ORGANISM FROM A SINGLE SET OF BLOOD CULTURES WHEN MULTIPLE SETS ARE  DRAWN IS UNCERTAIN. PLEASE NOTIFY THE MICROBIOLOGY DEPARTMENT WITHIN ONE WEEK IF SPECIATION AND SENSITIVITIES ARE REQUIRED. Performed at [facility], [place] [address] [place], [place] [address]    Report Status 02/27/2017 FINAL  Final  Blood Culture ID Panel (Reflexed)     Status: None   Collection Time: 02/22/17  2:47 AM  Result Value Ref Range Status   Enterococcus species NOT DETECTED NOT DETECTED Final   Vancomycin resistance NOT DETECTED NOT DETECTED Final   Listeria monocytogenes NOT DETECTED NOT DETECTED Final   Staphylococcus species NOT DETECTED NOT DETECTED Final   Staphylococcus aureus NOT DETECTED NOT DETECTED Final   Methicillin resistance NOT DETECTED NOT DETECTED Final   Streptococcus species NOT DETECTED NOT DETECTED Final   Streptococcus agalactiae NOT DETECTED NOT DETECTED Final   Streptococcus pneumoniae NOT DETECTED NOT DETECTED Final   Streptococcus pyogenes NOT DETECTED NOT DETECTED Final   Acinetobacter baumannii NOT DETECTED NOT DETECTED Final   Enterobacteriaceae species NOT DETECTED NOT DETECTED Final   Enterobacter cloacae complex NOT DETECTED NOT DETECTED Final   Escherichia coli NOT DETECTED NOT DETECTED Final   Klebsiella oxytoca NOT DETECTED NOT DETECTED Final   Klebsiella  pneumoniae NOT DETECTED NOT DETECTED Final   Proteus species NOT DETECTED NOT DETECTED Final   Serratia marcescens NOT DETECTED NOT DETECTED Final   Carbapenem resistance NOT DETECTED NOT DETECTED Final   Haemophilus influenzae NOT DETECTED NOT DETECTED Final   Neisseria meningitidis NOT DETECTED NOT DETECTED Final   Pseudomonas aeruginosa NOT DETECTED NOT DETECTED Final   Candida albicans NOT DETECTED NOT DETECTED Final   Candida glabrata NOT DETECTED NOT DETECTED Final   Candida krusei NOT DETECTED NOT DETECTED Final   Candida parapsilosis NOT DETECTED NOT DETECTED Final   Candida tropicalis NOT DETECTED NOT DETECTED Final    Comment: Performed at [facility], [place] [address], [place], [place] [address]  Culture, blood (Routine x 2)     Status: None   Collection Time: 02/22/17  3:02 AM  Result Value Ref Range Status   Specimen Description BLOOD BLOOD RIGHT ARM  Final   Special Requests   Final    BOTTLES DRAWN AEROBIC AND ANAEROBIC Blood Culture results may not be optimal due to an excessive volume of blood received in culture bottles   Culture   Final    NO GROWTH 5 DAYS Performed at [facility], [place] [address], [place], [place] [address]    Report Status 02/27/2017 FINAL  Final  MRSA PCR Screening     Status: None   Collection Time: 02/22/17  9:20 AM  Result Value Ref Range Status   MRSA by PCR NEGATIVE NEGATIVE Final    Comment:        The GeneXpert MRSA Assay (FDA approved for NASAL specimens only), is one component of a comprehensive MRSA colonization surveillance program. It is not intended to diagnose MRSA infection nor to guide or monitor treatment for MRSA infections.   Culture, Urine     Status: None   Collection Time: 02/22/17  6:00 PM  Result Value Ref Range Status   Specimen Description URINE, CATHETERIZED  Final   Special Requests NONE  Final   Culture   Final    NO GROWTH Performed at [facility], [address], [place], [place]  [address]    Report Status 02/24/2017 FINAL  Final  C difficile quick scan w PCR reflex     Status: None  Collection Time: 02/22/17  6:00 PM  Result Value Ref Range Status   C Diff antigen NEGATIVE NEGATIVE Final   C Diff toxin NEGATIVE NEGATIVE Final   C Diff interpretation No C. difficile detected.  Final         Radiology Studies: No results found.      Scheduled Meds: . Chlorhexidine Gluconate Cloth  6 each Topical Daily  . furosemide  40 mg Intravenous Daily  . heparin  5,000 Units Subcutaneous Q8H  . insulin aspart  0-15 Units Subcutaneous Q4H  . latanoprost  1 drop Both Eyes QHS  . mouth rinse  15 mL Mouth Rinse QID  . sodium chloride flush  10-40 mL Intracatheter Q12H   Continuous Infusions: . Marland KitchenTPN (CLINIMIX-E) Adult     And  . fat emulsion    . sodium chloride Stopped (02/24/17 1600)  . magnesium sulfate 1 - 4 g bolus IVPB    . potassium chloride Stopped (03/02/17 1329)  . potassium PHOSPHATE IVPB (mmol) 30 mmol (03/02/17 1342)  . Marland KitchenTPN (CLINIMIX-E) Adult 40 mL/hr at 03/01/17 1758  . valproate sodium 500 mg (03/02/17 1342)     LOS: 8 days    Time spent: 96min    Domenic Polite, MD [facility] Page via [email or web address], password [email or web address]  If 7PM-7AM, please contact night-coverage [email or web address] Password [email or web address] 03/02/2017, 1:59 PM

## 2017-03-02 NOTE — Anesthesia Preprocedure Evaluation (Addendum)
Anesthesia Evaluation  Patient identified by MRN, date of birth, ID band Patient awake and Patient confused    Reviewed: Allergy & Precautions, NPO status , Patient's Chart, lab work & pertinent test results  Airway Mallampati: II   Neck ROM: Full    Dental  (+) Edentulous Upper   Pulmonary former smoker,     + decreased breath sounds      Cardiovascular hypertension, + CAD and + Peripheral Vascular Disease   Rhythm:Regular Rate:Normal     Neuro/Psych CVA    GI/Hepatic GERD  ,  Endo/Other  diabetes  Renal/GU      Musculoskeletal   Abdominal   Peds  Hematology  (+) anemia ,   Anesthesia Other Findings   Reproductive/Obstetrics                            Anesthesia Physical Anesthesia Plan  ASA: IV  Anesthesia Plan: MAC   Post-op Pain Management:    Induction: Intravenous  PONV Risk Score and Plan: 1 and Ondansetron and Dexamethasone  Airway Management Planned: Natural Airway and Simple Face Mask  Additional Equipment:   Intra-op Plan:   Post-operative Plan:   Informed Consent: I have reviewed the patients History and Physical, chart, labs and discussed the procedure including the risks, benefits and alternatives for the proposed anesthesia with the patient or authorized representative who has indicated his/her understanding and acceptance.     Plan Discussed with:   Anesthesia Plan Comments:         Anesthesia Quick Evaluation

## 2017-03-02 NOTE — Op Note (Signed)
[facility] Patient Name: Derek Crawford Procedure Date: 03/02/2017 MRN: [id number] Attending MD: [name] , MD Date of Birth: 1933-06-24 CSN: [id number] Age: 83 Admit Type: Inpatient Procedure:                Flexible Sigmoidoscopy Indications:              Abnormal abdominal x-ray of the GI tract; persisent                            colon dilation presumed to be ileus however recent                            abd imaging raised question about possible distal                            obstruction of colon. Providers:                [name], MD, Cleda Daub, RN, Alan Mulder, Technician Referring MD:              Medicines:                Monitored Anesthesia Care Complications:            No immediate complications. Estimated blood loss:                            None. Estimated Blood Loss:     Estimated blood loss: none. Procedure:                Pre-Anesthesia Assessment:                           - Prior to the procedure, a History and Physical                            was performed, and patient medications and                            allergies were reviewed. The patient's tolerance of                            previous anesthesia was also reviewed. The risks                            and benefits of the procedure and the sedation                            options and risks were discussed with the patient.                            All questions were answered, and informed consent                            was  obtained. Prior Anticoagulants: The patient has                            taken no previous anticoagulant or antiplatelet                            agents. ASA Grade Assessment: IV - A patient with                            severe systemic disease that is a constant threat                            to life. After reviewing the risks and benefits,                            the patient was deemed  in satisfactory condition to                            undergo the procedure.                           After obtaining informed consent, the scope was                            passed under direct vision. The [id number] ([id number])                            scope was introduced through the anus and advanced                            to the the splenic flexure. The flexible                            sigmoidoscopy was accomplished without difficulty.                            The patient tolerated the procedure well. The bowel                            was unprepped but still this was adequate. Findings:      The entire examined colon (to the region of the splenic flexure) was       normal.      There were no stricture, stenosis and the mucosa apppeared normal. There       was a bit more air within the colon than is usual and I suctioned this       clear. There was normal appearing mixed solid/liquid stool begining at       the level of the splenic flexure and amore proximally. Impression:               - There are no distal obstructing lesions. There                            was somewhat more air in the colon than is  usual, I                            suctioned this clear.                           - He passed gas, moblized the air, while on left                            side during this examination. Moderate Sedation:      N/A- Per Anesthesia Care Recommendation:           - Return patient to hospital ward for ongoing care.                           - Continued mobilization will probably help resolve                            his ileus.                           - Given his very frail state and mounting                            comorbidities, I recommend palliative care                            consultation for goals of care.                           - For future reference, PEG is not possible given                            his very large hiatal hernia. Procedure  Code(s):        --- Professional ---                           [phone], Sigmoidoscopy, flexible; diagnostic,                            including collection of specimen(s) by brushing or                            washing, when performed (separate procedure) Diagnosis Code(s):        --- Professional ---                           R93.3, Abnormal findings on diagnostic imaging of                            other parts of digestive tract CPT copyright 2016 [employer]. All rights reserved. The codes documented in this report are preliminary and upon coder review may  be revised to meet current compliance requirements. [name], MD 03/02/2017 10:55:44 AM This report has been signed electronically. Number of Addenda: 0

## 2017-03-02 NOTE — Progress Notes (Signed)
Nutrition Follow-up  DOCUMENTATION CODES:   Not applicable  INTERVENTION:  - Continue TPN per Pharmacy. - Monitor for initiation of CLD and plan for further diet advancement.  - RD will provide interventions as warranted with diet advancement.   Spoke with Estill Bamberg, Pharmacist to recommend advancement of TPN rate if possible to improve nutritional status; persistently low lytes despite TPN only at 40 mL/hr since initiation on 8/17--question if related to gastric losses, Lasix-induced hypokalemia rather than refeeding.    NUTRITION DIAGNOSIS:   Inadequate oral intake related to inability to eat as evidenced by NPO status. -ongoing  GOAL:   Patient will meet greater than or equal to 90% of their needs -unmet with current TPN rate.   MONITOR:   Weight trends, Labs, I & O's, Other (Comment) (TPN regimen)  ASSESSMENT:   83 y.o. 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  Required intubation after arrival to ICU. Surgery consulted for ileus.  8/23 Pt with triple lumen L IJ and continues with Clinimix E 5/15 @ 40 mL/hr d/t persistently low K, Mg, and Phos. Addition of 20% ILE @ 10 mL/hr x12 hours/day (120 mL). Total TPN regimen is providing 48 grams of protein (53% minimum estimated protein need) and 922 kcal (51% minimum estimated kcal need). Weight fluctuations during admission; continue to monitor closely.   SLP saw pt yesterday AM and recommended maintaining NPO status. Per Dr. Delene Loll note yesterday AM, plan for trial of CLD when mentation improves with suspection of some degree of dysphagia 2/2 prior CVA and exacerbation by VDRF, rectal tube removed 8/21. Plan for SNF when medically appropriate for d/c.   Pt being taken off the floor at the time of RD arrival to the floor; visualized pt in the hallway near the elevators. GI note this AM states pt to have flex sig today.   Medications reviewed; 40 mg  IV Lasix/day, sliding scale Novolog, 10 mEq IV KCl x4 runs today, 20 mmol IV KPhos x1 run yesterday. Labs reviewed; CBGs: 118 mg/dL and 138 mg/dL x2 this AM, K: 3.3 mmol/L, Phos: 2.4 mg/dL, Mg: 1.6 mg/dL, Ca: 8 mg/dL.    8/20 - Extubated and NGT removed this AM; RN reports ~30-40 minutes prior to RD visit. - RN reports awaiting plan per Surgery at this time; no note yet this AM.  - Per Dr. Pollie Friar note yesterday AM, if pt was to remain intubated today/longer than today could try trickle feeds.  - No NGT/OGT in place at this time.  - Estimated nutrition needs updated based on extubation and resolved sepsis and based on weight from 8/15 (90.7 kg).  - Weight +1.9 kg since that time.  - Triple lumen L IJ and receiving Clinimix E 5/15 @ 40 mL/hr with ILE on hold per ICU protocol per ASPEN guidelines.  - This regimen is providing 48 grams of protein and 682 kcal.   30 mmol IV KPhos x1 dose yesterday, no K runs ordered. K: 3.1 mmol/L, Phos: 2.1 mg/dL IVF: LR @ 10 mL/hr.    8/17 - RD consulted for patient expected to start TPN.  - Weight is +7 lb since 8/16, used weight from 8/16 for calculation of needs.  - Given low levels of Mg, Phos, K, would continue to monitor for signs of refeeding syndrome.  Patient is currently intubated on ventilator support MV: 10L/min Temp (24hrs), Avg:98.2 F (36.8 C), Min:97.6 F (36.4 C), Max:98.4  F (36.9 C)  Plan per Pharmacy 8/17: -Will defer starting TPN until 8/18 after electrolytes corrected, hopefully Magnesium normalized. Patient is at high risk for refeeding (?already refeeding without TPN based on labs) so will need to titrate TPN slowly once started.    Diet Order:  TPN (CLINIMIX-E) Adult Diet NPO time specified  Skin:  Reviewed, no issues  Last BM:  8/23  Height:   Ht Readings from Last 1 Encounters:  02/22/17 5\' 9"  (1.753 m)    Weight:   Wt Readings from Last 1 Encounters:  03/01/17 195 lb 5.2 oz (88.6 kg)     Ideal Body Weight:  72.7 kg  BMI:  Body mass index is 28.84 kg/m.  Estimated Nutritional Needs:   Kcal:  1815-2085 (20-23 kcal/kg)  Protein:  90-100 grams  Fluid:  >1.7L/day   EDUCATION NEEDS:   No education needs identified at this time    Jarome Matin, MS, RD, LDN, CNSC Inpatient Clinical Dietitian Pager # [phone] After hours/weekend pager # [phone]

## 2017-03-03 ENCOUNTER — Encounter (HOSPITAL_COMMUNITY): Payer: Self-pay | Admitting: Gastroenterology

## 2017-03-03 LAB — BASIC METABOLIC PANEL
ANION GAP: 3 — AB (ref 5–15)
BUN: 12 mg/dL (ref 6–20)
CALCIUM: 8.1 mg/dL — AB (ref 8.9–10.3)
CHLORIDE: 101 mmol/L (ref 101–111)
CO2: 33 mmol/L — AB (ref 22–32)
Creatinine, Ser: 0.79 mg/dL (ref 0.61–1.24)
GFR calc Af Amer: >60 mL/min (ref >60)
GFR calc non Af Amer: >60 mL/min (ref >60)
GLUCOSE: 136 mg/dL — AB (ref 65–99)
Potassium: 3.5 mmol/L (ref 3.5–5.1)
Sodium: 137 mmol/L (ref 135–145)

## 2017-03-03 LAB — CBC
HCT: 32.3 % — ABNORMAL LOW (ref 39.0–52.0)
HEMOGLOBIN: 10.4 g/dL — AB (ref 13.0–17.0)
MCH: 26 pg (ref 26.0–34.0)
MCHC: 32.2 g/dL (ref 30.0–36.0)
MCV: 80.8 fL (ref 78.0–100.0)
Platelets: 135 10*3/uL — ABNORMAL LOW (ref 150–400)
RBC: 4 MIL/uL — AB (ref 4.22–5.81)
RDW: 13.8 % (ref 11.5–15.5)
WBC: 6.5 10*3/uL (ref 4.0–10.5)

## 2017-03-03 LAB — GLUCOSE, CAPILLARY
GLUCOSE-CAPILLARY: 122 mg/dL — AB (ref 65–99)
GLUCOSE-CAPILLARY: 146 mg/dL — AB (ref 65–99)
GLUCOSE-CAPILLARY: 153 mg/dL — AB (ref 65–99)
GLUCOSE-CAPILLARY: 158 mg/dL — AB (ref 65–99)
Glucose-Capillary: 145 mg/dL — ABNORMAL HIGH (ref 65–99)

## 2017-03-03 LAB — MAGNESIUM: Magnesium: 1.9 mg/dL (ref 1.7–2.4)

## 2017-03-03 LAB — PHOSPHORUS: Phosphorus: 2.6 mg/dL (ref 2.5–4.6)

## 2017-03-03 MED ORDER — FAT EMULSION 20 % IV EMUL
120.0000 mL | INTRAVENOUS | Status: DC
  Filled 2017-03-03 (×2): qty 200

## 2017-03-03 MED ORDER — TRACE MINERALS CR-CU-MN-SE-ZN 10-1000-500-60 MCG/ML IV SOLN
INTRAVENOUS | Status: DC
  Filled 2017-03-03: qty 1560

## 2017-03-03 MED ORDER — TRACE MINERALS CR-CU-MN-SE-ZN 10-1000-500-60 MCG/ML IV SOLN
INTRAVENOUS | Status: AC
  Administered 2017-03-03: 18:00:00 via INTRAVENOUS
  Filled 2017-03-03: qty 1560

## 2017-03-03 MED ORDER — FAT EMULSION 20 % IV EMUL
250.0000 mL | INTRAVENOUS | Status: AC
  Administered 2017-03-03: 250 mL via INTRAVENOUS
  Filled 2017-03-03: qty 250

## 2017-03-03 MED ORDER — POTASSIUM CHLORIDE 10 MEQ/100ML IV SOLN
10.0000 meq | INTRAVENOUS | Status: AC
  Administered 2017-03-03 – 2017-03-04 (×4): 10 meq via INTRAVENOUS
  Filled 2017-03-03 (×4): qty 100

## 2017-03-03 NOTE — Progress Notes (Signed)
[name] NOTE   Pharmacy Consult for TPN Indication: prolonged ileus  Patient Measurements: Height: 5\' 9"  (175.3 cm) Weight: 196 lb 10.4 oz (89.2 kg) IBW/kg (Calculated) : 70.7  AdjBW: 76kg  Body mass index is 29.04 kg/m.   HPI: 83 yo M admitted from NH on 02/22/17 with acute respiratory failure due from aspiration pneumonia and colonic ileus.  Intubated 8/15-20, ileus treated with NG decompression.  He is not a candidate for tube feedings due to hiatal hernia/risk of aspiration.   Pharmacy consulted to start TPN while patient is NPO.   Significant events:  8/20 Extubated and NGT removed.  MD to consider trickle feeds, but no NGT/OGT in place at this time. 8/21 Changed from ICU to telemetry status, will resume lipids in TPN dosing.  Xray shows persistent SB ileus. 8/23 Flex sigmoidoscopy showed no distal obstructing lesions  Insulin requirements past 24 hours: 7 units Novolog   Current Nutrition: NPO, evaluated by SLP 8/22 as severe aspiration risk  IVF: none  Central access:  CVC triple lumen placed 8/15 TPN start date: 8/18  ASSESSMENT                                                                                                          Today. 03/03/2017:   Glucose:  CBGs good.  Hx DM on Lantus 25 units/day PTA, not currently restarted as inpatient   Electrolytes:  K+, Mag, Phos now WNL after replacement and increase in rate of TPN. Patient is at risk for refeeding but difficult to differentiate from GI losses of electrolytes so will increase TPN to goal rate cautiously  Renal:  WNL, stable  LFTs:  WNL (8/20)  TGs:  122 (8/20)  Prealbumin:  9.6 (8/20)   NUTRITIONAL GOALS                                                                                             RD recs (8/20):  90-100 Protein, 1815-2085 Kcal Clinimix 5/15 at a goal rate of 83 ml/hr + 20% fat emulsion 240 ml/day to provide: 100 g of protein and 1894 kCals per  day meeting 100% of protein and 100% of kCal needs   PLAN  At 1800 today:  Advance Clinimix E 5/15 slightly more toward goal rate of 83 ml/hr and advance to  65 ml/hr this evening  Increase 20% fat emulsion to 70ml/hr over 12 hr.   Plan to advance as tolerated to the goal rate; advancing slowly d/t risk for refeeding syndrome.  TPN to contain standard multivitamins and trace elements daily.  Continue CBGs and moderate SSI q4h for now - likely reduce requirements tomorrow if CBGs remain at goal levels   TPN lab panels on Mondays & Thursdays.  BMET, Mag, Phos in AM.   Adrian Saran, PharmD, BCPS Pager [phone] 03/03/2017 11:05 AM

## 2017-03-03 NOTE — Progress Notes (Signed)
     [facility] Progress Note  Subjective:  Denies abdominal pain.  Continues to have minimal verbal responses.  Objective:  Vital signs in last 24 hours: Temp:  [98 F (36.7 C)-98.8 F (37.1 C)] 98.7 F (37.1 C) (08/24 0422) Pulse Rate:  [51-71] 61 (08/24 0422) Resp:  [15-25] 20 (08/24 0422) BP: (95-150)/(37-95) 108/40 (08/24 0422) SpO2:  [97 %-100 %] 100 % (08/24 0422) Weight:  [196 lb 10.4 oz (89.2 kg)] 196 lb 10.4 oz (89.2 kg) (08/24 0426) Last BM Date: 03/02/17 General:  Alert, Well-developed, in NAD Heart:  Regular rate and rhythm; no murmurs Pulm:  Coarse breath sounds noted.  No increased WOB. Abdomen:  Soft, non-distended.  BS present.  Non-tender.   Extremities:  Without edema. Neurologic:  Alert and oriented x 4;  grossly normal neurologically. Psych:  Alert and cooperative. Normal mood and affect. Lab Results:  Recent Labs  02/28/17 0944 03/01/17 0345 03/03/17 0810  WBC 6.5 8.4 6.5  HGB 10.5* 11.2* 10.4*  HCT 31.6* 34.8* 32.3*  PLT 124* 140* 135*   BMET  Recent Labs  03/01/17 0345 03/02/17 0415 03/03/17 0326  NA 140 139 137  K 3.1* 3.3* 3.5  CL 103 102 101  CO2 31 33* 33*  GLUCOSE 132* 133* 136*  BUN 13 13 12   CREATININE 0.84 0.84 0.79  CALCIUM 8.2* 8.0* 8.1*   LFT  Recent Labs  03/02/17 0415  PROT 5.6*  ALBUMIN 2.3*  AST 25  ALT 20  ALKPHOS 39  BILITOT 0.2*   Assessment / Plan: *Ileus secondary to PNA, sepsis: Improving.  S/p flex sig on 8/23 without any sign of obstructing lesion; excess air suctioned.  Keep electrolytes WNL's, ideally should keep K+ 4 or greater.   *Severe oropharyngeal dysphagia:  On TNA currently since speech recommending NPO.  **Needs palliative care consult to establish goals of care.   LOS: 9 days   ZEHR, JESSICA D.  03/03/2017, 9:39 AM  Pager number [phone]  ________________________________________________________________________  Vel[facility] Heckler GI MD note:  I personally examined the  patient, reviewed the data and agree with the assessment and plan described above.  He is on strict NPO for oropharyngeal dsyphagia.  Ileus seems to wax and wane.  Clearly no distal colon strictures, cancer that are contributing.  Has most of his stomach in his chest, would not be safe to place a PEG if/when his bowels are clearly functioning better. From ileus perspective, mobilizing him as much as possible can help (laying on left side then right side, then back again each for 1-2 hours). Sitting upright as much as possible.  Keep his electrolytes in normal range (K especially).  Given his frailty and mounting comorbidities, would consider changing goals of care to comfort measures only, recommend palliative care input as well.   Please call or page with any further questions or concerns. Will sign off for now.    Owens Loffler, MD [facility] Pager [phone]

## 2017-03-03 NOTE — Progress Notes (Signed)
  Speech Language Pathology Treatment: Dysphagia  Patient Details Name: Derek Crawford MRN: [id number] DOB: 31-Jul-1932 Today's Date: 03/03/2017 Time: 7673-4193 SLP Time Calculation (min) (ACUTE ONLY): 17 min  Assessment / Plan / Recommendation Clinical Impression  Pt sleepy today but participative during session.  He continues with gross weakness and congested nonproductive cough.    Pt willingly accepted only single boluses of nectar thick water, jello and ice chip.  Excessive delay suspected in oral transiting and pharyngeal swallow presumed.  Pt demonstrated multiple breath cycles between swallows despite max cues to swallow.  Immediately post=swallow he would wince and say "I don't want that".  In addition, after only 3 small boluses, pt presented with excessive coughing with appearance of minimal distress that resolved after a few minutes. Suspect overt aspiration of secretions and pharyngeal residuals of intake.   SLP left pt fully upright in bed and requested NT to leave him upright for at least 20 minutes to hopefully allow pharyngeal clearance.   Unfortunately [name] has not demonstrated functional improvement with swallowing over the last 2 days.  Concern present for swallow prognosis given level of dysphagia/deconditioning.  Recommend consider palliative consult to help establish goals of care.  Will follow up Monday 8/27 in hopes of improvement - however gross deficits remain.   Informed RN of findings/recommendations.    HPI HPI: 83 yo M NH resident presented with ileus and difficulty of breathing. CT abdomen confirmed ileus and blood work showed acute renal failure with signs of sepsis.  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  Pt had NG placed but it is now removed and he was placed on clears.  Pt was coughing with all intake yesterday per RN.  MBS conducted 2017 as an OP showed mild difficulties with no aspiration/penetration.        SLP Plan  Continue with current plan of care;Other (Comment) (recommend consider pallaitive consult as pt with severe dysphgia and does not want intake per his statement with po trials)       Recommendations  Diet recommendations: NPO (oral moisture/care for comfort, hygeine) Medication Administration: Via alternative means                Oral Care Recommendations: Oral care QID Follow up Recommendations:  (tbd) SLP Visit Diagnosis: Dysphagia, oropharyngeal phase (R13.12) Plan: Continue with current plan of care;Other (Comment) (recommend consider pallaitive consult as pt with severe dysphgia and does not want intake per his statement with po trials)       GO               Luanna Salk, [place] [facility] SLP [phone]  Macario Golds 03/03/2017, 11:07 AM

## 2017-03-03 NOTE — Progress Notes (Addendum)
PROGRESS NOTE    Derek Crawford  HYQ:[id number] DOB: Oct 25, 1932 DOA: 02/22/2017 PCP: No primary care provider on file.  Brief Narrative: 83 year old chronically ill male from a nursing home with CVA and right hemiparesis, seizures,  diabetes, GERD, debility/wheelchair-bound was admitted to the ICU on 8/15 due to respiratory failure from aspiration pneumonia and colonic ileus. CT on admission showed diffuse ileus, suspected cholelithiasis, patchy bilateral airspace opacities was admitted to the ICU, intubated, treated with IV cefepime, also followed by GI and general surgery, treated with NG decompression as well as a rectal tube. Completed 8 days of cefepime on 8/21 -Transferred to [facility] service  8/22, failed swallow eval-high aspiration risk 8/22 with worsening ileus s/p Flex sig on 8/23 8/24: failed swallow eval again, Palliative recommended  Assessment & Plan:   1. Acute hypoxic respiratory failure -Due to sepsis/aspiration pneumonia likely related to ileus -s/p VDRF -completed course of IV cefepime on 8/21 -s/p repeat SLP evaluation today-NPO /high aspiration risk and palliative care meeting recommended -Remains on TNA  2. Colonic ileus - treated conservatively with NG decompression and a rectal tube -Followed by general surgery in ICU -was initially improving and NG tube out and rectal tube removed on 8/21 -then worsened again, s/p flex sig by GI which ruled out obstruction but continues to have ongoing intermittent ileus -severe dysphagia also another challenging issue at this time, per SLP eval in 2017 mild dysphagia then, but now severe and very high aspiration risk -unable to place PEG due to advance age/decreased cognition and anatomically most of stomach is in the chest,  -Remains on TNA- -GI also recommends palliative care/comfort care, will contact son and request palliative consult for Goals of care  3. History of CVAs/right hemiparesis  -resume ASA/statin when PO  able  4. History of seizures  -Continue Depakote by IV   5. Metabolic encephalopathy  -secondary to #1 and 2  -also on scheduled Reglan and when necessary fentanyl in ICU which I stopped few days back -no improvement despite medical management  6. Diabetes mellitus ,  -lantus on hold, SSI  7. Dysphagia -multifactorial, see above, s/p SLP re-eval again, high aspiration risk, palliative consult recommended   DVT prophylaxis: Hep SQ Code Status: Full Code Family Communication:none at bedside, will call and d/w son Marden Noble Disposition Plan: will need SNF at DC  Consultants:  GI CCS PCCM Transfer  Subjective: + BM overnight and this am, remains obtunded  Objective: Vitals:   03/02/17 1200 03/02/17 2044 03/03/17 0422 03/03/17 0426  BP: (!) 112/40 (!) 150/49 (!) 108/40   Pulse: (!) 51 66 61   Resp:  20 20   Temp: 98.8 F (37.1 C) 98.1 F (36.7 C) 98.7 F (37.1 C)   TempSrc: Oral Oral Oral   SpO2: 99% 100% 100%   Weight:    89.2 kg (196 lb 10.4 oz)  Height:        Intake/Output Summary (Last 24 hours) at 03/03/17 1346 Last data filed at 03/03/17 0600  Gross per 24 hour  Intake              858 ml  Output             1450 ml  Net             -592 ml   Filed Weights   02/28/17 0533 03/01/17 0415 03/03/17 0426  Weight: 92.4 kg (203 lb 11.3 oz) 88.6 kg (195 lb 5.2 oz) 89.2 kg (196 lb 10.4 oz)  Examination:  Gen: Elderly obese male, chronically ill appearing, remains poorly responsive HEENT: Pupils equal and reactive, no JVD Lungs:  Few scattered ronchi CVS: RRR,No Gallops,Rubs or new Murmurs Abd: soft, Non tender, non distended, BS present Extremities: No Cyanosis, Clubbing or edema Skin: no new rashes Neurology: Chronic right hemiparesis Psych: flat affect    Data Reviewed:   CBC:  Recent Labs Lab 02/25/17 0312 02/26/17 0412 02/27/17 0741 02/28/17 0944 03/01/17 0345 03/03/17 0810  WBC 6.7 6.5 6.8 6.5 8.4 6.5  NEUTROABS 4.2  --  4.6  --   --    --   HGB 10.8* 12.3* 11.1* 10.5* 11.2* 10.4*  HCT 32.8* 37.5* 33.9* 31.6* 34.8* 32.3*  MCV 79.4 81.0 79.2 80.6 81.7 80.8  PLT 121* 132* 132* 124* 140* 557*   Basic Metabolic Panel:  Recent Labs Lab 02/27/17 0741 02/28/17 0944 03/01/17 0345 03/02/17 0415 03/03/17 0326  NA 140 141 140 139 137  K 3.1* 3.0* 3.1* 3.3* 3.5  CL 109 104 103 102 101  CO2 25 31 31  33* 33*  GLUCOSE 131* 129* 132* 133* 136*  BUN 10 12 13 13 12   CREATININE 0.85 0.91 0.84 0.84 0.79  CALCIUM 7.8* 8.0* 8.2* 8.0* 8.1*  MG 1.7 1.6* 1.8 1.6* 1.9  PHOS 2.1* 2.2* 2.2* 2.4* 2.6   GFR: Estimated Creatinine Clearance: 78.6 mL/min (by C-G formula based on SCr of 0.79 mg/dL). Liver Function Tests:  Recent Labs Lab 02/25/17 0440 02/26/17 0412 02/27/17 0741 03/02/17 0415  AST 23 26 22 25   ALT 15* 18 17 20   ALKPHOS 38 42 37* 39  BILITOT 0.7 0.5 0.6 0.2*  PROT 5.4* 5.8* 5.2* 5.6*  ALBUMIN 2.3* 2.5* 2.2* 2.3*   No results for input(s): LIPASE, AMYLASE in the last 168 hours. No results for input(s): AMMONIA in the last 168 hours. Coagulation Profile: No results for input(s): INR, PROTIME in the last 168 hours. Cardiac Enzymes: No results for input(s): CKTOTAL, CKMB, CKMBINDEX, TROPONINI in the last 168 hours. BNP (last 3 results) No results for input(s): PROBNP in the last 8760 hours. HbA1C: No results for input(s): HGBA1C in the last 72 hours. CBG:  Recent Labs Lab 03/02/17 1150 03/02/17 1707 03/02/17 2041 03/03/17 0031 03/03/17 0420  GLUCAP 127* 134* 151* 146* 145*   Lipid Profile: No results for input(s): CHOL, HDL, LDLCALC, TRIG, CHOLHDL, LDLDIRECT in the last 72 hours. Thyroid Function Tests: No results for input(s): TSH, T4TOTAL, FREET4, T3FREE, THYROIDAB in the last 72 hours. Anemia Panel: No results for input(s): VITAMINB12, FOLATE, FERRITIN, TIBC, IRON, RETICCTPCT in the last 72 hours. Urine analysis:    Component Value Date/Time   COLORURINE AMBER (A) 02/22/2017 0817   APPEARANCEUR  CLOUDY (A) 02/22/2017 0817   LABSPEC 1.024 02/22/2017 0817   PHURINE 5.0 02/22/2017 0817   GLUCOSEU NEGATIVE 02/22/2017 0817   HGBUR LARGE (A) 02/22/2017 0817   BILIRUBINUR NEGATIVE 02/22/2017 0817   KETONESUR 5 (A) 02/22/2017 0817   PROTEINUR 100 (A) 02/22/2017 0817   UROBILINOGEN 0.2 10/04/2013 2134   NITRITE NEGATIVE 02/22/2017 0817   LEUKOCYTESUR TRACE (A) 02/22/2017 0817   Sepsis Labs: @LABRCNTIP (procalcitonin:4,lacticidven:4)  ) Recent Results (from the past 240 hour(s))  Culture, blood (Routine x 2)     Status: Abnormal   Collection Time: 02/22/17  2:47 AM  Result Value Ref Range Status   Specimen Description BLOOD LEFT ANTECUBITAL  Final   Special Requests   Final    BOTTLES DRAWN AEROBIC AND ANAEROBIC Blood Culture results may not  be optimal due to an excessive volume of blood received in culture bottles   Culture  Setup Time   Final    GRAM POSITIVE RODS AEROBIC BOTTLE ONLY CRITICAL RESULT CALLED TO, READ BACK BY AND VERIFIED WITH: T GREEN,PHARMD AT [place] 02/26/17 BY L BENFIELD    Culture (A)  Final    CORYNEBACTERIUM SPECIES THE SIGNIFICANCE OF ISOLATING THIS ORGANISM FROM A SINGLE SET OF BLOOD CULTURES WHEN MULTIPLE SETS ARE DRAWN IS UNCERTAIN. PLEASE NOTIFY THE MICROBIOLOGY DEPARTMENT WITHIN ONE WEEK IF SPECIATION AND SENSITIVITIES ARE REQUIRED. Performed at [facility], [place] [address], [place], [place] [address]    Report Status 02/27/2017 FINAL  Final  Blood Culture ID Panel (Reflexed)     Status: None   Collection Time: 02/22/17  2:47 AM  Result Value Ref Range Status   Enterococcus species NOT DETECTED NOT DETECTED Final   Vancomycin resistance NOT DETECTED NOT DETECTED Final   Listeria monocytogenes NOT DETECTED NOT DETECTED Final   Staphylococcus species NOT DETECTED NOT DETECTED Final   Staphylococcus aureus NOT DETECTED NOT DETECTED Final   Methicillin resistance NOT DETECTED NOT DETECTED Final   Streptococcus species NOT DETECTED NOT DETECTED  Final   Streptococcus agalactiae NOT DETECTED NOT DETECTED Final   Streptococcus pneumoniae NOT DETECTED NOT DETECTED Final   Streptococcus pyogenes NOT DETECTED NOT DETECTED Final   Acinetobacter baumannii NOT DETECTED NOT DETECTED Final   Enterobacteriaceae species NOT DETECTED NOT DETECTED Final   Enterobacter cloacae complex NOT DETECTED NOT DETECTED Final   Escherichia coli NOT DETECTED NOT DETECTED Final   Klebsiella oxytoca NOT DETECTED NOT DETECTED Final   Klebsiella pneumoniae NOT DETECTED NOT DETECTED Final   Proteus species NOT DETECTED NOT DETECTED Final   Serratia marcescens NOT DETECTED NOT DETECTED Final   Carbapenem resistance NOT DETECTED NOT DETECTED Final   Haemophilus influenzae NOT DETECTED NOT DETECTED Final   Neisseria meningitidis NOT DETECTED NOT DETECTED Final   Pseudomonas aeruginosa NOT DETECTED NOT DETECTED Final   Candida albicans NOT DETECTED NOT DETECTED Final   Candida glabrata NOT DETECTED NOT DETECTED Final   Candida krusei NOT DETECTED NOT DETECTED Final   Candida parapsilosis NOT DETECTED NOT DETECTED Final   Candida tropicalis NOT DETECTED NOT DETECTED Final    Comment: Performed at [facility], [place] [address] [place], [place] [address]  Culture, blood (Routine x 2)     Status: None   Collection Time: 02/22/17  3:02 AM  Result Value Ref Range Status   Specimen Description BLOOD BLOOD RIGHT ARM  Final   Special Requests   Final    BOTTLES DRAWN AEROBIC AND ANAEROBIC Blood Culture results may not be optimal due to an excessive volume of blood received in culture bottles   Culture   Final    NO GROWTH 5 DAYS Performed at [facility], [place] [address] [place],  [address]    Report Status 02/27/2017 FINAL  Final  MRSA PCR Screening     Status: None   Collection Time: 02/22/17  9:20 AM  Result Value Ref Range Status   MRSA by PCR NEGATIVE NEGATIVE Final    Comment:        The GeneXpert MRSA Assay (FDA approved for NASAL  specimens only), is one component of a comprehensive MRSA colonization surveillance program. It is not intended to diagnose MRSA infection nor to guide or monitor treatment for MRSA infections.   Culture, Urine     Status: None  Collection Time: 02/22/17  6:00 PM  Result Value Ref Range Status   Specimen Description URINE, CATHETERIZED  Final   Special Requests NONE  Final   Culture   Final    NO GROWTH Performed at [facility], [address], [place], [place] [address]    Report Status 02/24/2017 FINAL  Final  C difficile quick scan w PCR reflex     Status: None   Collection Time: 02/22/17  6:00 PM  Result Value Ref Range Status   C Diff antigen NEGATIVE NEGATIVE Final   C Diff toxin NEGATIVE NEGATIVE Final   C Diff interpretation No C. difficile detected.  Final         Radiology Studies: No results found.      Scheduled Meds: . Chlorhexidine Gluconate Cloth  6 each Topical Daily  . heparin  5,000 Units Subcutaneous Q8H  . insulin aspart  0-15 Units Subcutaneous Q4H  . latanoprost  1 drop Both Eyes QHS  . mouth rinse  15 mL Mouth Rinse QID  . sodium chloride flush  10-40 mL Intracatheter Q12H   Continuous Infusions: . Marland KitchenTPN (CLINIMIX-E) Adult 50 mL/hr at 03/02/17 1722  . fat emulsion     And  . Marland KitchenTPN (CLINIMIX-E) Adult    . sodium chloride Stopped (02/24/17 1600)  . valproate sodium 500 mg (03/03/17 1300)     LOS: 9 days    Time spent: 70min    Domenic Polite, MD [facility] Page via [email or web address], password [email or web address]  If 7PM-7AM, please contact night-coverage [email or web address] Password [email or web address] 03/03/2017, 1:46 PM

## 2017-03-04 LAB — CBC
HCT: 31.6 % — ABNORMAL LOW (ref 39.0–52.0)
HEMOGLOBIN: 10.2 g/dL — AB (ref 13.0–17.0)
MCH: 26.6 pg (ref 26.0–34.0)
MCHC: 32.3 g/dL (ref 30.0–36.0)
MCV: 82.5 fL (ref 78.0–100.0)
Platelets: 144 10*3/uL — ABNORMAL LOW (ref 150–400)
RBC: 3.83 MIL/uL — AB (ref 4.22–5.81)
RDW: 13.9 % (ref 11.5–15.5)
WBC: 7.1 10*3/uL (ref 4.0–10.5)

## 2017-03-04 LAB — GLUCOSE, CAPILLARY
GLUCOSE-CAPILLARY: 130 mg/dL — AB (ref 65–99)
GLUCOSE-CAPILLARY: 153 mg/dL — AB (ref 65–99)
GLUCOSE-CAPILLARY: 152 mg/dL — AB (ref 65–99)
GLUCOSE-CAPILLARY: 155 mg/dL — AB (ref 65–99)
Glucose-Capillary: 149 mg/dL — ABNORMAL HIGH (ref 65–99)
Glucose-Capillary: 155 mg/dL — ABNORMAL HIGH (ref 65–99)
Glucose-Capillary: 172 mg/dL — ABNORMAL HIGH (ref 65–99)

## 2017-03-04 LAB — BASIC METABOLIC PANEL
Anion gap: 5 (ref 5–15)
BUN: 15 mg/dL (ref 6–20)
CO2: 31 mmol/L (ref 22–32)
CREATININE: 0.82 mg/dL (ref 0.61–1.24)
Calcium: 8.1 mg/dL — ABNORMAL LOW (ref 8.9–10.3)
Chloride: 101 mmol/L (ref 101–111)
GFR calc Af Amer: >60 mL/min (ref >60)
GLUCOSE: 169 mg/dL — AB (ref 65–99)
POTASSIUM: 3.6 mmol/L (ref 3.5–5.1)
SODIUM: 137 mmol/L (ref 135–145)

## 2017-03-04 LAB — MAGNESIUM: MAGNESIUM: 1.8 mg/dL (ref 1.7–2.4)

## 2017-03-04 LAB — PHOSPHORUS: Phosphorus: 2.4 mg/dL — ABNORMAL LOW (ref 2.5–4.6)

## 2017-03-04 MED ORDER — MORPHINE SULFATE (PF) 2 MG/ML IV SOLN
1.0000 mg | INTRAVENOUS | Status: DC | PRN
  Administered 2017-03-04 – 2017-03-07 (×13): 1 mg via INTRAVENOUS
  Filled 2017-03-04 (×13): qty 1

## 2017-03-04 MED ORDER — TRACE MINERALS CR-CU-MN-SE-ZN 10-1000-500-60 MCG/ML IV SOLN
INTRAVENOUS | Status: AC
  Administered 2017-03-04: 19:00:00 via INTRAVENOUS
  Filled 2017-03-04: qty 1992

## 2017-03-04 MED ORDER — INSULIN GLARGINE 100 UNIT/ML ~~LOC~~ SOLN
10.0000 [IU] | Freq: Every day | SUBCUTANEOUS | Status: DC
  Administered 2017-03-04 – 2017-03-08 (×5): 10 [IU] via SUBCUTANEOUS
  Filled 2017-03-04 (×6): qty 0.1

## 2017-03-04 MED ORDER — POTASSIUM PHOSPHATES 15 MMOLE/5ML IV SOLN
20.0000 mmol | Freq: Once | INTRAVENOUS | Status: AC
  Administered 2017-03-04: 20 mmol via INTRAVENOUS
  Filled 2017-03-04: qty 6.67

## 2017-03-04 MED ORDER — FAT EMULSION 20 % IV EMUL
250.0000 mL | INTRAVENOUS | Status: AC
  Administered 2017-03-04: 250 mL via INTRAVENOUS
  Filled 2017-03-04: qty 250

## 2017-03-04 NOTE — Progress Notes (Addendum)
PMT no charge note  PMT consult request received Chart reviewed Patient seen briefly this am, moaning and uncomfortable Discussed with [facility] MD Dr. Broadus John.   Call placed and discussed with son Anthonio Mizzell. He is appreciative of the information he has received from hospital doctors and understands the serious nature of his father's multiple life limiting conditions. We did discuss about hospice philosophy of care and comfort measures.   He is currently in [place], [place]. He will arrive at the hospital to see his father later today.  He has been given our contact information. Awaiting to hear back from him when is is at the patient's bedside later today for additional goals of care discussions.  Full note and final recommendations to follow Thank you for the consult.  Loistine Chance MD [phone]  [facility].   Addendum: 03-04-17 at [place] call from son Marden Noble, he has not been able to come to the hospital today so far. He thinks he might be able to come by tonight. He is speaking with his brothers and sisters about the patient's condition. He asks for more time to think these things through. I will call him again in am. Full note to follow.

## 2017-03-04 NOTE — Progress Notes (Addendum)
[facility] NOTE   Pharmacy Consult for TPN Indication: prolonged ileus  Patient Measurements: Height: 5\' 9"  (175.3 cm) Weight: 196 lb 10.4 oz (89.2 kg) IBW/kg (Calculated) : 70.7  AdjBW: 76kg  Body mass index is 29.04 kg/m.   HPI: 83 yo M admitted from NH on 02/22/17 with acute respiratory failure due from aspiration pneumonia and colonic ileus.  Intubated 8/15-20, ileus treated with NG decompression.  He is not a candidate for tube feedings due to hiatal hernia/risk of aspiration.   Pharmacy consulted to start TPN while patient is NPO.   Significant events:  8/20 Extubated and NGT removed.  MD to consider trickle feeds, but no NGT/OGT in place at this time. 8/21 Changed from ICU to telemetry status, will resume lipids in TPN dosing.  Xray shows persistent SB ileus. 8/23 Flex sigmoidoscopy showed no distal obstructing lesions  Insulin requirements past 24 hours: 19 units Novolog likely due to increasing rate of TPN  Current Nutrition: NPO, evaluated by SLP 8/22 as severe aspiration risk  IVF: none  Central access:  CVC triple lumen placed 8/15 TPN start date: 8/18  ASSESSMENT                                                                                                          Today. 03/04/2017:   Glucose:  CBGs trending upward now (since new TPN started 130, 155, 172, 153) likely due to increase in TPN rate.  Hx DM on Lantus 25 units/day PTA, not currently restarted as inpatient   Electrolytes:  WNL except phos slightly low  Renal:  WNL, stable  LFTs:  WNL (8/20)  TGs:  122 (8/20)  Prealbumin:  9.6 (8/20)   NUTRITIONAL GOALS                                                                                             RD recs (8/20):  90-100 Protein, 1815-2085 Kcal Clinimix 5/15 at a goal rate of 83 ml/hr + 20% fat emulsion 240 ml/day to provide: 100 g of protein and 1894 kCals per day meeting 100% of protein and 100% of kCal needs    PLAN  Start Lantus 10 units SQ daily  Kphos 15mMol x 1 today  At 1800 today:  Advance Clinimix E 5/15 to goal rate of 83 ml/hr tonight    Continue 20% fat emulsion to 39ml/hr over 12 hr.   Plan to advance as tolerated to the goal rate; advancing slowly d/t risk for refeeding syndrome.  TPN to contain standard multivitamins and trace elements daily.  Continue CBGs and moderate SSI q4h for now but will start Lantus 10 units SQ daily due to CBGs trending upward  TPN lab panels on Mondays & Thursdays.  BMET, Mag, Phos in AM.   Adrian Saran, PharmD, BCPS Pager [phone] 03/04/2017 10:26 AM

## 2017-03-04 NOTE — Progress Notes (Signed)
PROGRESS NOTE    Derek Crawford  UTM:[id number] DOB: 1933-03-27 DOA: 02/22/2017 PCP: No primary care provider on file.  Brief Narrative: 83 year old chronically ill male from a nursing home with CVA and right hemiparesis, seizures,  diabetes, GERD, debility/wheelchair-bound was admitted to the ICU on 8/15 due to respiratory failure from aspiration pneumonia and colonic ileus. CT on admission showed diffuse ileus, suspected cholelithiasis, patchy bilateral airspace opacities was admitted to the ICU, intubated, treated with IV cefepime, also followed by GI and general surgery, treated with NG decompression as well as a rectal tube. Completed 8 days of cefepime on 8/21 -Transferred to [facility] service  8/22, failed swallow eval-high aspiration risk 8/22 with worsening ileus s/p Flex sig on 8/23 8/24: failed swallow eval again, Palliative recommended, Gi signed off and recommended comfort care/palliative  Assessment & Plan:   1. Acute hypoxic respiratory failure -Due to sepsis/aspiration pneumonia possibly related to ileus and dysphagia -s/p VDRF -completed course of IV cefepime on 8/21 -s/p repeat SLP evaluation 8/24->NPO /high aspiration risk and palliative care meeting recommended -Remains on TNA -I called and discussed situation with patient's son Marden Noble, recommended consideration of comfort focused care and participation in a palliative care meeting  2. Colonic ileus - treated conservatively with NG decompression and a rectal tube -Followed by general surgery in ICU -was initially improving and NG tube out and rectal tube removed on 8/21 -then worsened again, s/p flex sig by GI which ruled out obstruction but continues to have ongoing intermittent ileus -severe dysphagia also another challenging issue at this time, per SLP eval in 2017 mild dysphagia then, but now severe and very high aspiration risk -unable to place PEG due to advance age/decreased cognition and anatomically most of stomach  is in the chest,  -Remains on TNA- -GI also recommends palliative care/comfort care, -Palliative care consulted for goals of care meeting, discussed with son this morning about this  3. History of CVAs/right hemiparesis  -resume ASA/statin when PO able  4. History of seizures  -Continue Depakote by IV   5. Metabolic encephalopathy  -secondary to #1 and 2  -also on scheduled Reglan and when necessary fentanyl in ICU which I stopped few days back -no improvement despite medical management  6. Diabetes mellitus ,  -lantus on hold, SSI  7. Dysphagia -multifactorial, see above -Suspect mild compensated dysphagia related to previous stroke however exacerbated significantly in the setting of critical illness, long ICU stay and mechanical ventilation,  -s/p SLP re-eval again, high aspiration risk, palliative consult recommended  DVT prophylaxis: Hep SQ Code Status: Full Code Family Communication:none at bedside, called and d/w son Marden Noble Disposition Plan: To be determined  Consultants:  GI CCS PCCM Transfer  Subjective: -Remains lethargic,   Objective: Vitals:   03/03/17 0426 03/03/17 1400 03/03/17 2020 03/04/17 0448  BP:  (!) 138/48 (!) 139/56 (!) 123/46  Pulse:  78 65 (!) 58  Resp:   20 18  Temp:  98.4 F (36.9 C) 98.2 F (36.8 C) 98.2 F (36.8 C)  TempSrc:  Oral Oral Oral  SpO2:  100% 99% 100%  Weight: 89.2 kg (196 lb 10.4 oz)     Height:        Intake/Output Summary (Last 24 hours) at 03/04/17 1301 Last data filed at 03/04/17 1154  Gross per 24 hour  Intake           780.09 ml  Output             2525 ml  Net         -  1744.91 ml   Filed Weights   02/28/17 0533 03/01/17 0415 03/03/17 0426  Weight: 92.4 kg (203 lb 11.3 oz) 88.6 kg (195 lb 5.2 oz) 89.2 kg (196 lb 10.4 oz)    Examination:  Gen: Elderly chronically ill-appearing male, poorly responsive  HEENT: Pupils sluggish but reactive Lungs: Scattered rhonchi CVS: RRR,No Gallops,Rubs or new Murmurs Abd:  Soft, obese, mildly distended, bowel sounds increased Extremities: No Cyanosis, Clubbing or edema Skin: no new rashes Neurology: Chronic right hemiparesis   Data Reviewed:   CBC:  Recent Labs Lab 02/27/17 0741 02/28/17 0944 03/01/17 0345 03/03/17 0810 03/04/17 0424  WBC 6.8 6.5 8.4 6.5 7.1  NEUTROABS 4.6  --   --   --   --   HGB 11.1* 10.5* 11.2* 10.4* 10.2*  HCT 33.9* 31.6* 34.8* 32.3* 31.6*  MCV 79.2 80.6 81.7 80.8 82.5  PLT 132* 124* 140* 135* 559*   Basic Metabolic Panel:  Recent Labs Lab 02/28/17 0944 03/01/17 0345 03/02/17 0415 03/03/17 0326 03/04/17 0424  NA 141 140 139 137 137  K 3.0* 3.1* 3.3* 3.5 3.6  CL 104 103 102 101 101  CO2 31 31 33* 33* 31  GLUCOSE 129* 132* 133* 136* 169*  BUN 12 13 13 12 15   CREATININE 0.91 0.84 0.84 0.79 0.82  CALCIUM 8.0* 8.2* 8.0* 8.1* 8.1*  MG 1.6* 1.8 1.6* 1.9 1.8  PHOS 2.2* 2.2* 2.4* 2.6 2.4*   GFR: Estimated Creatinine Clearance: 76.7 mL/min (by C-G formula based on SCr of 0.82 mg/dL). Liver Function Tests:  Recent Labs Lab 02/26/17 0412 02/27/17 0741 03/02/17 0415  AST 26 22 25   ALT 18 17 20   ALKPHOS 42 37* 39  BILITOT 0.5 0.6 0.2*  PROT 5.8* 5.2* 5.6*  ALBUMIN 2.5* 2.2* 2.3*   No results for input(s): LIPASE, AMYLASE in the last 168 hours. No results for input(s): AMMONIA in the last 168 hours. Coagulation Profile: No results for input(s): INR, PROTIME in the last 168 hours. Cardiac Enzymes: No results for input(s): CKTOTAL, CKMB, CKMBINDEX, TROPONINI in the last 168 hours. BNP (last 3 results) No results for input(s): PROBNP in the last 8760 hours. HbA1C: No results for input(s): HGBA1C in the last 72 hours. CBG:  Recent Labs Lab 03/03/17 1615 03/03/17 2016 03/04/17 0013 03/04/17 0445 03/04/17 0803  GLUCAP 122* 130* 155* 172* 153*   Lipid Profile: No results for input(s): CHOL, HDL, LDLCALC, TRIG, CHOLHDL, LDLDIRECT in the last 72 hours. Thyroid Function Tests: No results for input(s):  TSH, T4TOTAL, FREET4, T3FREE, THYROIDAB in the last 72 hours. Anemia Panel: No results for input(s): VITAMINB12, FOLATE, FERRITIN, TIBC, IRON, RETICCTPCT in the last 72 hours. Urine analysis:    Component Value Date/Time   COLORURINE AMBER (A) 02/22/2017 0817   APPEARANCEUR CLOUDY (A) 02/22/2017 0817   LABSPEC 1.024 02/22/2017 0817   PHURINE 5.0 02/22/2017 0817   GLUCOSEU NEGATIVE 02/22/2017 0817   HGBUR LARGE (A) 02/22/2017 0817   BILIRUBINUR NEGATIVE 02/22/2017 0817   KETONESUR 5 (A) 02/22/2017 0817   PROTEINUR 100 (A) 02/22/2017 0817   UROBILINOGEN 0.2 10/04/2013 2134   NITRITE NEGATIVE 02/22/2017 0817   LEUKOCYTESUR TRACE (A) 02/22/2017 0817   Sepsis Labs: @LABRCNTIP (procalcitonin:4,lacticidven:4)  ) Recent Results (from the past 240 hour(s))  Culture, Urine     Status: None   Collection Time: 02/22/17  6:00 PM  Result Value Ref Range Status   Specimen Description URINE, CATHETERIZED  Final   Special Requests NONE  Final   Culture   Final  NO GROWTH Performed at [facility], [place] [address], [place], [place] [address]    Report Status 02/24/2017 FINAL  Final  C difficile quick scan w PCR reflex     Status: None   Collection Time: 02/22/17  6:00 PM  Result Value Ref Range Status   C Diff antigen NEGATIVE NEGATIVE Final   C Diff toxin NEGATIVE NEGATIVE Final   C Diff interpretation No C. difficile detected.  Final         Radiology Studies: No results found.      Scheduled Meds: . Chlorhexidine Gluconate Cloth  6 each Topical Daily  . heparin  5,000 Units Subcutaneous Q8H  . insulin aspart  0-15 Units Subcutaneous Q4H  . insulin glargine  10 Units Subcutaneous Daily  . latanoprost  1 drop Both Eyes QHS  . mouth rinse  15 mL Mouth Rinse QID  . sodium chloride flush  10-40 mL Intracatheter Q12H   Continuous Infusions: . Marland KitchenTPN (CLINIMIX-E) Adult 65 mL/hr at 03/03/17 1752  . Marland KitchenTPN (CLINIMIX-E) Adult     And  . fat emulsion    . sodium chloride  Stopped (02/24/17 1600)  . potassium PHOSPHATE IVPB (mmol)    . valproate sodium Stopped (03/04/17 0812)     LOS: 10 days    Time spent: 78min    Domenic Polite, MD [facility] Page via [email or web address], password [email or web address]  If 7PM-7AM, please contact night-coverage [email or web address] Password [email or web address] 03/04/2017, 1:01 PM

## 2017-03-05 DIAGNOSIS — A419 Sepsis, unspecified organism: Secondary | ICD-10-CM

## 2017-03-05 DIAGNOSIS — Z7189 Other specified counseling: Secondary | ICD-10-CM

## 2017-03-05 DIAGNOSIS — Z515 Encounter for palliative care: Secondary | ICD-10-CM

## 2017-03-05 DIAGNOSIS — R1310 Dysphagia, unspecified: Secondary | ICD-10-CM

## 2017-03-05 LAB — GLUCOSE, CAPILLARY
GLUCOSE-CAPILLARY: 158 mg/dL — AB (ref 65–99)
GLUCOSE-CAPILLARY: 175 mg/dL — AB (ref 65–99)
Glucose-Capillary: 135 mg/dL — ABNORMAL HIGH (ref 65–99)
Glucose-Capillary: 123 mg/dL — ABNORMAL HIGH (ref 65–99)
Glucose-Capillary: 158 mg/dL — ABNORMAL HIGH (ref 65–99)
Glucose-Capillary: 138 mg/dL — ABNORMAL HIGH (ref 65–99)
Glucose-Capillary: 143 mg/dL — ABNORMAL HIGH (ref 65–99)

## 2017-03-05 LAB — BASIC METABOLIC PANEL
Anion gap: 5 (ref 5–15)
BUN: 17 mg/dL (ref 6–20)
CHLORIDE: 103 mmol/L (ref 101–111)
CO2: 33 mmol/L — ABNORMAL HIGH (ref 22–32)
CREATININE: 0.76 mg/dL (ref 0.61–1.24)
Calcium: 8.3 mg/dL — ABNORMAL LOW (ref 8.9–10.3)
GFR calc Af Amer: >60 mL/min (ref >60)
GFR calc non Af Amer: >60 mL/min (ref >60)
GLUCOSE: 153 mg/dL — AB (ref 65–99)
Potassium: 3.7 mmol/L (ref 3.5–5.1)
SODIUM: 141 mmol/L (ref 135–145)

## 2017-03-05 LAB — COMPREHENSIVE METABOLIC PANEL
ALBUMIN: 2.4 g/dL — AB (ref 3.5–5.0)
ALT: 13 U/L — AB (ref 17–63)
AST: 16 U/L (ref 15–41)
Alkaline Phosphatase: 37 U/L — ABNORMAL LOW (ref 38–126)
Anion gap: 5 (ref 5–15)
BILIRUBIN TOTAL: 0.3 mg/dL (ref 0.3–1.2)
BUN: 18 mg/dL (ref 6–20)
CHLORIDE: 103 mmol/L (ref 101–111)
CO2: 32 mmol/L (ref 22–32)
CREATININE: 0.75 mg/dL (ref 0.61–1.24)
Calcium: 8.6 mg/dL — ABNORMAL LOW (ref 8.9–10.3)
GFR calc Af Amer: >60 mL/min (ref >60)
GLUCOSE: 148 mg/dL — AB (ref 65–99)
POTASSIUM: 3.7 mmol/L (ref 3.5–5.1)
Sodium: 140 mmol/L (ref 135–145)
TOTAL PROTEIN: 6.3 g/dL — AB (ref 6.5–8.1)

## 2017-03-05 LAB — CBC
HCT: 31.8 % — ABNORMAL LOW (ref 39.0–52.0)
HEMATOCRIT: 32.5 % — AB (ref 39.0–52.0)
HEMOGLOBIN: 10.4 g/dL — AB (ref 13.0–17.0)
Hemoglobin: 10.3 g/dL — ABNORMAL LOW (ref 13.0–17.0)
MCH: 26.7 pg (ref 26.0–34.0)
MCH: 26.3 pg (ref 26.0–34.0)
MCHC: 32.4 g/dL (ref 30.0–36.0)
MCHC: 32 g/dL (ref 30.0–36.0)
MCV: 82.4 fL (ref 78.0–100.0)
MCV: 82.3 fL (ref 78.0–100.0)
PLATELETS: 144 10*3/uL — AB (ref 150–400)
Platelets: 185 10*3/uL (ref 150–400)
RBC: 3.86 MIL/uL — ABNORMAL LOW (ref 4.22–5.81)
RBC: 3.95 MIL/uL — AB (ref 4.22–5.81)
RDW: 14 % (ref 11.5–15.5)
RDW: 13.9 % (ref 11.5–15.5)
WBC: 6.9 10*3/uL (ref 4.0–10.5)
WBC: 6.4 10*3/uL (ref 4.0–10.5)

## 2017-03-05 LAB — TROPONIN I

## 2017-03-05 LAB — PHOSPHORUS: PHOSPHORUS: 3 mg/dL (ref 2.5–4.6)

## 2017-03-05 LAB — MAGNESIUM: Magnesium: 1.8 mg/dL (ref 1.7–2.4)

## 2017-03-05 MED ORDER — TRACE MINERALS CR-CU-MN-SE-ZN 10-1000-500-60 MCG/ML IV SOLN
INTRAVENOUS | Status: AC
  Administered 2017-03-05: 17:00:00 via INTRAVENOUS
  Filled 2017-03-05: qty 1992

## 2017-03-05 MED ORDER — FAT EMULSION 20 % IV EMUL
250.0000 mL | INTRAVENOUS | Status: AC
  Administered 2017-03-05: 250 mL via INTRAVENOUS
  Filled 2017-03-05: qty 250

## 2017-03-05 MED ORDER — CHLORHEXIDINE GLUCONATE 0.12 % MT SOLN
15.0000 mL | Freq: Two times a day (BID) | OROMUCOSAL | Status: DC
  Administered 2017-03-05 – 2017-03-14 (×13): 15 mL via OROMUCOSAL
  Filled 2017-03-05 (×14): qty 15

## 2017-03-05 NOTE — Progress Notes (Signed)
PROGRESS NOTE    Derek Crawford  NWG:[id number] DOB: [birth year] DOA: 02/22/2017 PCP: No primary care provider on file.  Brief Narrative: 83 year old chronically ill male from a nursing home with CVA and right hemiparesis, seizures,  diabetes, GERD, debility/wheelchair-bound was admitted to the ICU on 8/15 due to respiratory failure from aspiration pneumonia and colonic ileus. CT on admission showed diffuse ileus, suspected cholelithiasis, patchy bilateral airspace opacities was admitted to the ICU, intubated, treated with IV cefepime, also followed by GI and general surgery, treated with NG decompression as well as a rectal tube. Completed 8 days of cefepime on 8/21 -Transferred to [facility] service  8/22, failed swallow eval-high aspiration risk 8/22 with worsening ileus s/p Flex sig on 8/23 8/24: failed swallow eval again, Palliative recommended, Gi signed off and recommended comfort care/palliative  Assessment & Plan:   1. Acute hypoxic respiratory failure -Due to sepsis/aspiration pneumonia possibly related to ileus and dysphagia -s/p VDRF -completed course of IV cefepime on 8/21 -s/p repeat SLP evaluation 8/24->NPO /high aspiration risk and palliative care meeting recommended -Remains on TNA -I called and discussed situation with patient's son Marden Noble, recommended consideration of comfort focused care and participation in a palliative care meeting -pt more alert and hence SLP asked to re-assess, son is resistant to DNR/Hospice at this time  2. Colonic ileus - treated conservatively with NG decompression and a rectal tube -Followed by general surgery in ICU -was initially improving and NG tube out and rectal tube removed on 8/21 -then worsened again, s/p flex sig by GI which ruled out obstruction but continues to have ongoing intermittent ileus -severe dysphagia also another challenging issue at this time, per SLP eval in 2017 mild dysphagia then, but now severe and very high aspiration  risk -unable to place PEG due to advance age/decreased cognition and anatomically most of stomach is in the chest,  -Remains on TNA- -GI also recommends palliative care/comfort care, -Palliative care consulted for goals of care meeting  3. History of CVAs/right hemiparesis  -resume ASA/statin when PO able  4. History of seizures  -Continue Depakote by IV   5. Metabolic encephalopathy  -secondary to #1 and 2  -also on scheduled Reglan and when necessary fentanyl in ICU which I stopped few days back -no improvement despite medical management  6. Diabetes mellitus ,  -lantus on hold, SSI  7. Dysphagia -multifactorial, see above -Suspect mild compensated dysphagia related to previous stroke however exacerbated significantly in the setting of critical illness, long ICU stay and mechanical ventilation,  -s/p SLP re-eval again, high aspiration risk, palliative consult recommended, see above  DVT prophylaxis: Hep SQ Code Status: Full Code Family Communication:none at bedside, called and d/w son Marden Noble 8/25 Disposition Plan: To be determined  Consultants:  GI CCS PCCM Transfer  Subjective: -more alert this am, mumbles and says few things that make sense  Objective: Vitals:   03/04/17 1750 03/04/17 2147 03/05/17 0526 03/05/17 0548  BP:  (!) 123/47  (!) 122/49  Pulse:  61  63  Resp:  18  17  Temp:  98.1 F (36.7 C)  98.5 F (36.9 C)  TempSrc:  Oral  Oral  SpO2:  100%  99%  Weight: 90.1 kg (198 lb 10.2 oz)  90.7 kg (199 lb 15.3 oz)   Height:        Intake/Output Summary (Last 24 hours) at 03/05/17 1229 Last data filed at 03/05/17 5784  Gross per 24 hour  Intake  2300.75 ml  Output             1075 ml  Net          1225.75 ml   Filed Weights   03/03/17 0426 03/04/17 1750 03/05/17 0526  Weight: 89.2 kg (196 lb 10.4 oz) 90.1 kg (198 lb 10.2 oz) 90.7 kg (199 lb 15.3 oz)    Examination:  Gen: ELderly frail, chronically ill male, remains sluggish but more  responsive today  HEENT: PERRLA, Neck supple, no JVD Lungs: decreased BS at both bases CVS: RRR,No Gallops,Rubs or new Murmurs Abd: soft, Non tender, slightly distended, BS increased Extremities: No Cyanosis, Clubbing or edema Skin: no new rashes Neurology: Chronic right hemiparesis   Data Reviewed:   CBC:  Recent Labs Lab 02/27/17 0741 02/28/17 0944 03/01/17 0345 03/03/17 0810 03/04/17 0424 03/05/17 0341  WBC 6.8 6.5 8.4 6.5 7.1 6.9  NEUTROABS 4.6  --   --   --   --   --   HGB 11.1* 10.5* 11.2* 10.4* 10.2* 10.3*  HCT 33.9* 31.6* 34.8* 32.3* 31.6* 31.8*  MCV 79.2 80.6 81.7 80.8 82.5 82.4  PLT 132* 124* 140* 135* 144* 195*   Basic Metabolic Panel:  Recent Labs Lab 03/01/17 0345 03/02/17 0415 03/03/17 0326 03/04/17 0424 03/05/17 0341  NA 140 139 137 137 141  K 3.1* 3.3* 3.5 3.6 3.7  CL 103 102 101 101 103  CO2 31 33* 33* 31 33*  GLUCOSE 132* 133* 136* 169* 153*  BUN 13 13 12 15 17   CREATININE 0.84 0.84 0.79 0.82 0.76  CALCIUM 8.2* 8.0* 8.1* 8.1* 8.3*  MG 1.8 1.6* 1.9 1.8 1.8  PHOS 2.2* 2.4* 2.6 2.4* 3.0   GFR: Estimated Creatinine Clearance: 79.2 mL/min (by C-G formula based on SCr of 0.76 mg/dL). Liver Function Tests:  Recent Labs Lab 02/27/17 0741 03/02/17 0415  AST 22 25  ALT 17 20  ALKPHOS 37* 39  BILITOT 0.6 0.2*  PROT 5.2* 5.6*  ALBUMIN 2.2* 2.3*   No results for input(s): LIPASE, AMYLASE in the last 168 hours. No results for input(s): AMMONIA in the last 168 hours. Coagulation Profile: No results for input(s): INR, PROTIME in the last 168 hours. Cardiac Enzymes: No results for input(s): CKTOTAL, CKMB, CKMBINDEX, TROPONINI in the last 168 hours. BNP (last 3 results) No results for input(s): PROBNP in the last 8760 hours. HbA1C: No results for input(s): HGBA1C in the last 72 hours. CBG:  Recent Labs Lab 03/04/17 2008 03/05/17 0031 03/05/17 0425 03/05/17 0736 03/05/17 1203  GLUCAP 155* 175* 135* 123* 158*   Lipid Profile: No  results for input(s): CHOL, HDL, LDLCALC, TRIG, CHOLHDL, LDLDIRECT in the last 72 hours. Thyroid Function Tests: No results for input(s): TSH, T4TOTAL, FREET4, T3FREE, THYROIDAB in the last 72 hours. Anemia Panel: No results for input(s): VITAMINB12, FOLATE, FERRITIN, TIBC, IRON, RETICCTPCT in the last 72 hours. Urine analysis:    Component Value Date/Time   COLORURINE AMBER (A) 02/22/2017 0817   APPEARANCEUR CLOUDY (A) 02/22/2017 0817   LABSPEC 1.024 02/22/2017 0817   PHURINE 5.0 02/22/2017 0817   GLUCOSEU NEGATIVE 02/22/2017 0817   HGBUR LARGE (A) 02/22/2017 0817   BILIRUBINUR NEGATIVE 02/22/2017 0817   KETONESUR 5 (A) 02/22/2017 0817   PROTEINUR 100 (A) 02/22/2017 0817   UROBILINOGEN 0.2 10/04/2013 2134   NITRITE NEGATIVE 02/22/2017 0817   LEUKOCYTESUR TRACE (A) 02/22/2017 0817   Sepsis Labs: @LABRCNTIP (procalcitonin:4,lacticidven:4)  ) No results found for this or any previous visit (from the  past 240 hour(s)).       Radiology Studies: No results found.      Scheduled Meds: . Chlorhexidine Gluconate Cloth  6 each Topical Daily  . heparin  5,000 Units Subcutaneous Q8H  . insulin aspart  0-15 Units Subcutaneous Q4H  . insulin glargine  10 Units Subcutaneous Daily  . latanoprost  1 drop Both Eyes QHS  . mouth rinse  15 mL Mouth Rinse QID  . sodium chloride flush  10-40 mL Intracatheter Q12H   Continuous Infusions: . Marland KitchenTPN (CLINIMIX-E) Adult 83 mL/hr at 03/04/17 1835  . Marland KitchenTPN (CLINIMIX-E) Adult     And  . fat emulsion    . sodium chloride Stopped (02/24/17 1600)  . valproate sodium Stopped (03/05/17 0625)     LOS: 11 days    Time spent: 37min    Domenic Polite, MD [facility] Page via [email or web address], password [email or web address]  If 7PM-7AM, please contact night-coverage [email or web address] Password [facility] 03/05/2017, 12:29 PM

## 2017-03-05 NOTE — Progress Notes (Addendum)
CCM alerted RN patient going in and out of ventricular rhythm. VS stable, EKG obtained which shows BBB with HR 100-110. results called to oncall MD. Rapid response paged to assess patient. Patient alert, more restless than baseline. Report given to oncoming RN Tommie Raymond and tele certified RN whitney.  Barbee Shropshire. Brigitte Pulse, RN

## 2017-03-05 NOTE — Consult Note (Signed)
Consultation Note Date: 03/05/2017   Patient Name: Derek Crawford  DOB: 30-Jun-1933  MRN: [id number]  Age / Sex: 83 y.o., male  PCP: No primary care provider on file. Referring Physician: Domenic Polite, MD  Reason for Consultation: Establishing goals of care  HPI/Patient Profile: 83 y.o. male  admitted on 02/22/2017   Clinical Assessment and Goals of Care:  83 year old gentleman nursing home resident prior past medical history of stroke with right-sided weakness seizures, diabetes, reflux, baseline status of debility/wheelchair bound. Patient was initially admitted to the ICU and has been hospitalized since August 15. Initially for respiratory failure from aspiration pneumonia and colonic ileus. Patient was also intubated during early part of this hospitalization. He has been on broad-spectrum antibiotics. He has been followed by gastroenterology and general surgery. The patient underwent flexible sigmoidoscopy and has been regularly followed by speech and swallow therapy.  A palliative consult has been requested for goals of care discussions. Call placed and discussed with the patient's son Jones Viviani. I introduced myself and palliative care as follows: Palliative medicine is specialized medical care for people living with serious illness. It focuses on providing relief from the symptoms and stress of a serious illness. The goal is to improve quality of life for both the patient and the family.  We discussed about the serious underlying comorbidities the patient already had prior to this hospitalization. We also discussed about the patient's acute medical conditions that have prolonged his hospitalization. Discussed about speech therapy's recommendations. Discussed that the patient has very large hiatal hernia and a PEG tube is not indicated. Goals wishes and values discussed in detail. I have been in discussions  with the patient's son since 03-04-17.   See recommendations below. Thank you for the consult.  NEXT OF KIN  Son Laurence Crofford [place] Repeat speech evaluation, please start safest possible PO diet. Patient's son is aware of why a PEG tube is not indicated for Mr Wilber.  2. Patient's son wants patient to go to [facility] on D/C. Recommend SNF rehab with palliative following on discharge.  3. Full code for now. We did discuss about code status in detail, patient's son Marden Noble wishes to continue discussions with patient and with his siblings regarding the patient's code status and wishes at end of life.    Code Status/Advance Care Planning:  Full code    Symptom Management:    Continue current treatment  Palliative Prophylaxis:   Bowel Regimen  Additional Recommendations (Limitations, Scope, Preferences):  Full Scope Treatment  Psycho-social/Spiritual:   Desire for further Chaplaincy support:yes  Additional Recommendations: Education on Hospice  Prognosis:   Guarded.   Discharge Planning: [facility] for rehab with Palliative care service follow-up      Primary Diagnoses: Present on Admission: . Respiratory failure ([place])   I have reviewed the medical record, interviewed the patient and family, and examined the patient. The following aspects are pertinent.  Past Medical History:  Diagnosis Date  .  Diabetes mellitus without complication ([place])   . GERD (gastroesophageal reflux disease)   . Glaucoma   . Hemiplegia following CVA (cerebrovascular accident) ([place])    R side weakness  . Hyperlipidemia   . Hypertension   . Iron deficiency anemia   . Muscle weakness   . Urinary incontinence    Social History   Social History  . Marital status: Widowed    Spouse name: N/A  . Number of children: N/A  . Years of education: N/A   Social History Main Topics  . Smoking status: Former Research scientist (life sciences)  . Smokeless tobacco:  Never Used  . Alcohol use No  . Drug use: No  . Sexual activity: No   Other Topics Concern  . None   Social History Narrative   Resides in [facility]. One of his sons is in charge of the [facility] and that is why the patient moved here from [place],  [place].   Family History  Problem Relation Age of Onset  . Diabetes Maternal Grandfather   . Kidney disease Father   . Colon cancer Neg Hx   . Throat cancer Neg Hx   . Heart disease Neg Hx   . Liver disease Neg Hx    Scheduled Meds: . Chlorhexidine Gluconate Cloth  6 each Topical Daily  . heparin  5,000 Units Subcutaneous Q8H  . insulin aspart  0-15 Units Subcutaneous Q4H  . insulin glargine  10 Units Subcutaneous Daily  . latanoprost  1 drop Both Eyes QHS  . mouth rinse  15 mL Mouth Rinse QID  . sodium chloride flush  10-40 mL Intracatheter Q12H   Continuous Infusions: . Marland KitchenTPN (CLINIMIX-E) Adult 83 mL/hr at 03/04/17 1835  . Marland KitchenTPN (CLINIMIX-E) Adult     And  . fat emulsion    . sodium chloride Stopped (02/24/17 1600)  . valproate sodium Stopped (03/05/17 0625)   PRN Meds:.sodium chloride, albuterol, morphine injection, sodium chloride flush Medications Prior to Admission:  Prior to Admission medications   Medication Sig Start Date End Date Taking? Authorizing Provider  Amino Acids-Protein Hydrolys (PRO-STAT PO) Take by mouth. Take 30 ml twice daily due to low albumin   Yes [provider]  amLODipine (NORVASC) 2.5 MG tablet Take 2.5 mg by mouth daily. Hold for BP<100/60 and HR <60   Yes [provider]  aspirin 81 MG tablet Take 81 mg by mouth daily. 08/23/13  Yes Gatha Mayer, MD  atorvastatin (LIPITOR) 10 MG tablet Take 10 mg by mouth daily. Take one tablet at bedtime for hyperlipidemia   Yes [provider]  bisacodyl (DULCOLAX) 10 MG suppository Place 10 mg rectally as needed for moderate constipation.   Yes [provider]    divalproex (DEPAKOTE) 250 MG DR tablet Take 500 mg by mouth 3 (three) times daily between meals.    Yes [provider]  fluticasone (FLONASE) 50 MCG/ACT nasal spray Place 1 spray into the nose 2 (two) times daily.    Yes [provider]  furosemide (LASIX) 40 MG tablet Take 40 mg by mouth daily.    Yes [provider]  insulin glargine (LANTUS) 100 UNIT/ML injection Inject 25 Units into the skin. Inject every morning    Yes [provider]  latanoprost (XALATAN) 0.005 % ophthalmic solution Place 1 drop into both eyes at bedtime.   Yes [provider]  lisinopril (PRINIVIL,ZESTRIL) 5 MG tablet Take 5 mg by mouth daily.   Yes  [provider]  lubiprostone (AMITIZA) 24 MCG capsule Take 24 mcg by mouth 2 (two) times daily with a meal.   Yes [provider]  Multiple Vitamins-Minerals (CERTAGEN SILVER PO) Take by mouth. Take one tablet daily   Yes [provider]  tamsulosin (FLOMAX) 0.4 MG CAPS capsule Take 0.4 mg by mouth daily.   Yes [provider]  traZODone (DESYREL) 50 MG tablet Take 25 mg by mouth 2 (two) times daily.   Yes [provider]  vitamin B-12 (CYANOCOBALAMIN) 1000 MCG tablet Take 1,000 mcg by mouth daily.   Yes [provider]  Vitamin D, Ergocalciferol, (DRISDOL) 50000 units CAPS capsule Take 50,000 Units by mouth every 30 (thirty) days.   Yes [provider]   No Known Allergies Review of Systems Denies pain.   Physical Exam Elderly appearing gentleman resting in bed awake follows simple commands S1-S2 Coarse breath sounds Abdomen soft nontender Trace edema Chronic right sided weakness  Vital Signs: BP (!) 122/49 (BP Location: Right Arm)   Pulse 63   Temp 98.5 F (36.9 C) (Oral)   Resp 17   Ht 5\' 9"  (1.753 m)   Wt 90.7 kg (199 lb 15.3 oz)   SpO2 99%   BMI 29.53 kg/m  Pain Assessment: PAINAD POSS *See Group Information*: 2-Acceptable,Slightly drowsy, easily  aroused Pain Score: Asleep   SpO2: SpO2: 99 % O2 Device:SpO2: 99 % O2 Flow Rate: .O2 Flow Rate (L/min): 4 L/min  IO: Intake/output summary:  Intake/Output Summary (Last 24 hours) at 03/05/17 1130 Last data filed at 03/05/17 5732  Gross per 24 hour  Intake          2300.75 ml  Output             1550 ml  Net           750.75 ml    LBM: Last BM Date: 03/03/17 Baseline Weight: Weight: 90.7 kg (200 lb) Most recent weight: Weight: 90.7 kg (199 lb 15.3 oz)     Palliative Assessment/Data:     Time In:  11 Time Out:  12.10 Time Total:  70 min  Greater than 50%  of this time was spent counseling and coordinating care related to the above assessment and plan.  Signed by: Loistine Chance, MD  [phone]  Please contact Palliative Medicine Team phone at [phone] for questions and concerns.  For individual provider: See [facility]

## 2017-03-05 NOTE — Evaluation (Addendum)
Clinical/Bedside Swallow Evaluation Patient Details  Name: Derek Crawford MRN: [id number] Date of Birth: Dec 16, 1932  Today's Date: 03/05/2017 Time: SLP Start Time (ACUTE ONLY): 6387 SLP Stop Time (ACUTE ONLY): 1458 SLP Time Calculation (min) (ACUTE ONLY): 30 min  Past Medical History:  Past Medical History:  Diagnosis Date  . Diabetes mellitus without complication ([place])   . GERD (gastroesophageal reflux disease)   . Glaucoma   . Hemiplegia following CVA (cerebrovascular accident) ([place])    R side weakness  . Hyperlipidemia   . Hypertension   . Iron deficiency anemia   . Muscle weakness   . Urinary incontinence    Past Surgical History:  Past Surgical History:  Procedure Laterality Date  . CATARACT EXTRACTION Left   . CIRCUMCISION    . COLONOSCOPY N/A 08/23/2013   Procedure: COLONOSCOPY;  Surgeon: Gatha Mayer, MD;  Location: [facility] ENDOSCOPY;  Service: Endoscopy;  Laterality: N/A;  . ESOPHAGOGASTRODUODENOSCOPY N/A 08/23/2013   Procedure: ESOPHAGOGASTRODUODENOSCOPY (EGD);  Surgeon: Gatha Mayer, MD;  Location: [facility];  Service: Endoscopy;  Laterality: N/A;  . FLEXIBLE SIGMOIDOSCOPY N/A 03/02/2017   Procedure: FLEXIBLE SIGMOIDOSCOPY;  Surgeon: [name], MD;  Location: [facility] ENDOSCOPY;  Service: Endoscopy;  Laterality: N/A;  . TONSILLECTOMY     HPI:  83 yo M NH resident presented with ileus and difficulty of breathing. CT abdomen confirmed ileus and blood work showed acute renal failure with signs of sepsis.  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  Pt had NG placed but it is now removed and he was placed on clears.  Pt was coughing with all intake yesterday per RN.  MBS conducted 2017 as an OP showed mild difficulties with no aspiration/penetration.     Assessment / Plan / Recommendation Clinical Impression  Pts son present during repeat bedside swallow evaluation to maximize pt participation and for safe swallow education. Pt continues to  exhibit a suspected moderate oropharyngeal dysphagia of multlifactoral etiology inlcuding deconditioning, decreased respiratory status with 5 day intubation (extubation date 8-20), and hx of CVA. Pt also with hx of GERD and hiatal hernia per prior ST encounters. Pt alert however required increased cueing for accepting PO trials. Pt with intermittent wet vocal quality across PO trials concerning for reduced airway protection as well as delayed coughing. Pt also exhibited difficutly with coordinating sequencing for respiration and swallow function. Most recent chest x ray concerning for bibasilar atelectasis/infiltrates. Pts aspiration risk remains increased in setting of acute and chronic medical conditions. Recommned MBS to objectively identify aspiration risk. Discussed plans with son who is in agreement. SLP to follow up next date for readiness for MBS/PO trials pending pt condition.  SLP Visit Diagnosis: Dysphagia, oropharyngeal phase (R13.12)    Aspiration Risk  Moderate aspiration risk;Risk for inadequate nutrition/hydration;Severe aspiration risk    Diet Recommendation   NPO   Medication Administration: Via alternative means    Other  Recommendations Oral Care Recommendations: Oral care QID   Follow up Recommendations   MBS as pt medically ready     Frequency and Duration min 2x/week  2 weeks       Prognosis Prognosis for Safe Diet Advancement: Guarded Barriers to Reach Goals: Severity of deficits      Swallow Study   General Date of Onset: 03/01/17 HPI: 83 yo M NH resident presented with ileus and difficulty of breathing. CT abdomen confirmed ileus and blood work showed acute renal failure with signs of sepsis.  PMH of R  hemiparesis after CVA, DM, GERD  Swallow evaluation ordered.  Pt had NG placed but it is now removed and he was placed on clears.  Pt was coughing with all intake yesterday per RN.  MBS conducted 2017 as an OP showed mild difficulties with no aspiration/penetration.    Type of Study: Bedside Swallow Evaluation Previous Swallow Assessment: mbs 2017,  Diet Prior to this Study: NPO Temperature Spikes Noted: No Respiratory Status: Nasal cannula History of Recent Intubation: Yes Length of Intubations (days): 5 days Date extubated: 02/27/17 Behavior/Cognition: Alert;Requires cueing;Pleasant mood Oral Cavity - Dentition: Edentulous;Dentures, not available Self-Feeding Abilities: Total assist Patient Positioning: Upright in bed Baseline Vocal Quality: Low vocal intensity Volitional Cough: Weak;Congested Volitional Swallow: Unable to elicit    Oral/Motor/Sensory Function Overall Oral Motor/Sensory Function: Generalized oral weakness   Ice Chips     Thin Liquid Thin Liquid: Impaired Presentation: Cup;Spoon Oral Phase Impairments: Reduced labial seal;Reduced lingual movement/coordination Oral Phase Functional Implications: Prolonged oral transit Pharyngeal  Phase Impairments: Suspected delayed Swallow;Cough - Delayed;Wet Vocal Quality;Multiple swallows    Nectar Thick Nectar Thick Liquid: Not tested   Honey Thick Honey Thick Liquid: Not tested   Puree Puree: Impaired Oral Phase Impairments: Reduced lingual movement/coordination Oral Phase Functional Implications: Prolonged oral transit Pharyngeal Phase Impairments: Suspected delayed Swallow;Multiple swallows;Wet Vocal Quality   Solid   GO   Solid: Not tested       Arvil Chaco MA, CCC-SLP Acute Care Speech Language Pathologist    Levi Aland 03/05/2017,3:21 PM

## 2017-03-05 NOTE — Progress Notes (Addendum)
Toast NOTE   Pharmacy Consult for TPN Indication: prolonged ileus  Patient Measurements: Height: 5\' 9"  (175.3 cm) Weight: 199 lb 15.3 oz (90.7 kg) IBW/kg (Calculated) : 70.7  AdjBW: 76kg  Body mass index is 29.53 kg/m.   HPI: 83 yo M admitted from NH on 02/22/17 with acute respiratory failure due from aspiration pneumonia and colonic ileus.  Intubated 8/15-20, ileus treated with NG decompression.  He is not a candidate for tube feedings due to hiatal hernia/risk of aspiration.   Pharmacy consulted to start TPN while patient is NPO.   Significant events:  8/20 Extubated and NGT removed.  MD to consider trickle feeds, but no NGT/OGT in place at this time. 8/21 Changed from ICU to telemetry status, will resume lipids in TPN dosing.  Xray shows persistent SB ileus. 8/23 Flex sigmoidoscopy showed no distal obstructing lesions  Insulin requirements past 24 hours: 18 units Novolog likely due to increasing rate of TPN and Lantus 10 units daily started 8/25  Current Nutrition: NPO, evaluated by SLP 8/22 as severe aspiration risk  IVF: none  Central access:  CVC triple lumen placed 8/15 TPN start date: 8/18  ASSESSMENT                                                                                                          Today. 03/05/2017:   Glucose:  CBGs trending upward but fairly stable after start of Lantus 10 units daily (since Lantus given 8/25 152, 149, 155, 175, 135, 123) Hx DM on Lantus 25 units/day PTA  Electrolytes:  WNL  Renal:  WNL, stable  LFTs:  WNL (8/20)  TGs:  122 (8/20)  Prealbumin:  9.6 (8/20)   NUTRITIONAL GOALS                                                                                             RD recs (8/20):  90-100 Protein, 1815-2085 Kcal Clinimix 5/15 at a goal rate of 83 ml/hr + 20% fat emulsion 240 ml/day to provide: 100 g of protein and 1894 kCals per day meeting 100% of protein and 100% of kCal  needs   PLAN  At 1800 today:  Continue Clinimix E 5/15 at goal rate of 83 ml/hr    Continue 20% fat emulsion to 25ml/hr over 12 hr.   Plan to advance as tolerated to the goal rate; advancing slowly d/t risk for refeeding syndrome.  TPN to contain standard multivitamins and trace elements daily.  Continue CBGs and moderate SSI q4h for now and Lantus 10 units SQ daily due to CBGs trending upward  TPN lab panels on Mondays & Thursdays.  BMET, Mag, Phos in AM.   Adrian Saran, PharmD, BCPS Pager [phone] 03/05/2017 10:13 AM

## 2017-03-06 ENCOUNTER — Inpatient Hospital Stay (HOSPITAL_COMMUNITY): Payer: Medicare Other

## 2017-03-06 DIAGNOSIS — Z7189 Other specified counseling: Secondary | ICD-10-CM

## 2017-03-06 LAB — COMPREHENSIVE METABOLIC PANEL
ALK PHOS: 33 U/L — AB (ref 38–126)
ALT: 12 U/L — AB (ref 17–63)
AST: 15 U/L (ref 15–41)
Albumin: 2.4 g/dL — ABNORMAL LOW (ref 3.5–5.0)
Anion gap: 4 — ABNORMAL LOW (ref 5–15)
BUN: 19 mg/dL (ref 6–20)
CALCIUM: 8.5 mg/dL — AB (ref 8.9–10.3)
CHLORIDE: 102 mmol/L (ref 101–111)
CO2: 32 mmol/L (ref 22–32)
CREATININE: 0.76 mg/dL (ref 0.61–1.24)
Glucose, Bld: 154 mg/dL — ABNORMAL HIGH (ref 65–99)
Potassium: 3.5 mmol/L (ref 3.5–5.1)
SODIUM: 138 mmol/L (ref 135–145)
Total Bilirubin: 0.1 mg/dL — ABNORMAL LOW (ref 0.3–1.2)
Total Protein: 6.5 g/dL (ref 6.5–8.1)

## 2017-03-06 LAB — GLUCOSE, CAPILLARY
GLUCOSE-CAPILLARY: 163 mg/dL — AB (ref 65–99)
GLUCOSE-CAPILLARY: 132 mg/dL — AB (ref 65–99)
GLUCOSE-CAPILLARY: 145 mg/dL — AB (ref 65–99)
Glucose-Capillary: 153 mg/dL — ABNORMAL HIGH (ref 65–99)
Glucose-Capillary: 130 mg/dL — ABNORMAL HIGH (ref 65–99)

## 2017-03-06 LAB — DIFFERENTIAL
BASOS PCT: 0 %
Basophils Absolute: 0 10*3/uL (ref 0.0–0.1)
EOS ABS: 0.3 10*3/uL (ref 0.0–0.7)
EOS PCT: 5 %
LYMPHS ABS: 1.4 10*3/uL (ref 0.7–4.0)
Lymphocytes Relative: 21 %
MONO ABS: 0.9 10*3/uL (ref 0.1–1.0)
MONOS PCT: 14 %
Neutro Abs: 4.1 10*3/uL (ref 1.7–7.7)
Neutrophils Relative %: 60 %

## 2017-03-06 LAB — CBC
HEMATOCRIT: 32.5 % — AB (ref 39.0–52.0)
HEMOGLOBIN: 10.6 g/dL — AB (ref 13.0–17.0)
MCH: 26.6 pg (ref 26.0–34.0)
MCHC: 32.6 g/dL (ref 30.0–36.0)
MCV: 81.5 fL (ref 78.0–100.0)
Platelets: 185 10*3/uL (ref 150–400)
RBC: 3.99 MIL/uL — ABNORMAL LOW (ref 4.22–5.81)
RDW: 14 % (ref 11.5–15.5)
WBC: 6.7 10*3/uL (ref 4.0–10.5)

## 2017-03-06 LAB — MAGNESIUM: Magnesium: 1.8 mg/dL (ref 1.7–2.4)

## 2017-03-06 LAB — TRIGLYCERIDES: Triglycerides: 129 mg/dL (ref <150)

## 2017-03-06 LAB — PREALBUMIN: Prealbumin: 18 mg/dL (ref 18–38)

## 2017-03-06 LAB — PHOSPHORUS: PHOSPHORUS: 3.1 mg/dL (ref 2.5–4.6)

## 2017-03-06 MED ORDER — FAT EMULSION 20 % IV EMUL
240.0000 mL | INTRAVENOUS | Status: AC
  Administered 2017-03-06: 240 mL via INTRAVENOUS
  Filled 2017-03-06: qty 250

## 2017-03-06 MED ORDER — FUROSEMIDE 10 MG/ML IJ SOLN
40.0000 mg | Freq: Once | INTRAMUSCULAR | Status: AC
  Administered 2017-03-06: 40 mg via INTRAVENOUS
  Filled 2017-03-06: qty 4

## 2017-03-06 MED ORDER — FAT EMULSION 20 % IV EMUL: 240.0000 mL | INTRAVENOUS | Status: DC

## 2017-03-06 MED ORDER — MAGNESIUM SULFATE IN D5W 1-5 GM/100ML-% IV SOLN
1.0000 g | Freq: Once | INTRAVENOUS | Status: AC
  Administered 2017-03-06: 1 g via INTRAVENOUS
  Filled 2017-03-06: qty 100

## 2017-03-06 MED ORDER — TRACE MINERALS CR-CU-MN-SE-ZN 10-1000-500-60 MCG/ML IV SOLN
INTRAVENOUS | Status: AC
  Administered 2017-03-06: 17:00:00 via INTRAVENOUS
  Filled 2017-03-06: qty 1992

## 2017-03-06 MED ORDER — POTASSIUM CHLORIDE 10 MEQ/100ML IV SOLN
10.0000 meq | INTRAVENOUS | Status: AC
  Administered 2017-03-06 (×2): 10 meq via INTRAVENOUS
  Filled 2017-03-06 (×2): qty 100

## 2017-03-06 MED ORDER — DIPHENHYDRAMINE HCL 25 MG PO CAPS
25.0000 mg | ORAL_CAPSULE | Freq: Four times a day (QID) | ORAL | Status: DC | PRN
  Filled 2017-03-06: qty 1

## 2017-03-06 MED ORDER — M.V.I. ADULT IV INJ
INJECTION | INTRAVENOUS | Status: DC
Start: 2017-03-06 — End: 2017-03-06

## 2017-03-06 NOTE — Progress Notes (Signed)
PROGRESS NOTE    Derek Crawford  DVV:[id number] DOB: Dec 27, 1932 DOA: 02/22/2017 PCP: No primary care provider on file.  Brief Narrative: 83 year old chronically ill male from a nursing home with CVA and right hemiparesis, seizures,  diabetes, GERD, debility/wheelchair-bound was admitted to the ICU on 8/15 due to respiratory failure from aspiration pneumonia and colonic ileus. CT on admission showed diffuse ileus, suspected cholelithiasis, patchy bilateral airspace opacities was admitted to the ICU, intubated, treated with IV cefepime, also followed by GI and general surgery, treated with NG decompression as well as a rectal tube. Completed 8 days of cefepime on 8/21 -Transferred to [facility] service  8/22, failed swallow eval-high aspiration risk 8/22 with worsening ileus s/p Flex sig on 8/23 8/24: failed swallow eval again, Palliative recommended, Gi signed off and recommended comfort care/palliative  Assessment & Plan:   1. Acute hypoxic respiratory failure -Due to sepsis/aspiration pneumonia possibly related to ileus and dysphagia -s/p VDRF -completed course of IV cefepime on 8/21 -s/p repeat SLP evaluation 8/24->NPO /high aspiration risk and palliative care meeting recommended -Remains on TNA -I called and discussed situation with patient's son Marden Noble, recommended consideration of comfort focused care and participation in a palliative care meeting, son is resistant to DNR/Hospice at this time -Pt more alert 8/26 onwards hence SLP re-assessment requested, MBS today   2. Colonic ileus - treated conservatively with NG decompression and a rectal tube -Followed by general surgery in ICU -was initially improving and NG tube out and rectal tube removed on 8/21 -then worsened again, s/p flex sig by GI which ruled out obstruction but continues to have ongoing intermittent ileus -severe dysphagia also another challenging issue at this time, per SLP eval in 2017 mild dysphagia then, but now severe and  very high aspiration risk -unable to place PEG due to advance age/decreased cognition and anatomically most of stomach is in the chest, GI also recommends palliative care/comfort care, -Palliative care following -Remains on TNA-wean this off when PO intake improves-if this happens  3. History of CVAs/right hemiparesis  -resume ASA/statin when PO able  4. History of seizures  -Continue Depakote by IV   5. Metabolic encephalopathy  -secondary to #1 and 2  -also on scheduled Reglan and when necessary fentanyl in ICU which I stopped few days back -slight improvement now  6. Diabetes mellitus ,  -lantus on hold, SSI  7. Dysphagia -multifactorial, see above -Suspect mild compensated dysphagia related to previous stroke however exacerbated significantly in the setting of critical illness, long ICU stay and mechanical ventilation,  -s/p SLP re-eval again, high aspiration risk, palliative consult recommended, see above  DVT prophylaxis: Hep SQ Code Status: Full Code Family Communication:none at bedside, called and d/w son Marden Noble 8/25 and 8/26 Disposition Plan: To be determined  Consultants:  GI CCS PCCM Transfer  Subjective: -more lucid, a BM yesterday, is angry and frustrated  Objective: Vitals:   03/05/17 1913 03/05/17 2047 03/06/17 0436 03/06/17 1507  BP: (!) 112/51 (!) 132/51 (!) 115/44 (!) 130/52  Pulse: 63 65 65 60  Resp: 18 18 18    Temp: 98.7 F (37.1 C) 97.6 F (36.4 C) 98.2 F (36.8 C) 97.7 F (36.5 C)  TempSrc: Oral Oral Oral Oral  SpO2: 95% 100% 95% 98%  Weight:      Height:        Intake/Output Summary (Last 24 hours) at 03/06/17 1533 Last data filed at 03/06/17 1500  Gross per 24 hour  Intake  1430.85 ml  Output              700 ml  Net           730.85 ml   Filed Weights   03/03/17 0426 03/04/17 1750 03/05/17 0526  Weight: 89.2 kg (196 lb 10.4 oz) 90.1 kg (198 lb 10.2 oz) 90.7 kg (199 lb 15.3 oz)    Examination:  Gen: elderly chronically  ill male, more lucid and responsive HEENT: PERRLA, Neck supple, no JVD Lungs: decreased Bs at bases CVS: RRR,No Gallops,Rubs or new Murmurs Abd: soft, Non tender, non distended, BS present Extremities: No Cyanosis, Clubbing , trace edema Skin: no new rashes Neurology: Chronic right hemiparesis   Data Reviewed:   CBC:  Recent Labs Lab 03/03/17 0810 03/04/17 0424 03/05/17 0341 03/05/17 2142 03/06/17 0342  WBC 6.5 7.1 6.9 6.4 6.7  NEUTROABS  --   --   --   --  4.1  HGB 10.4* 10.2* 10.3* 10.4* 10.6*  HCT 32.3* 31.6* 31.8* 32.5* 32.5*  MCV 80.8 82.5 82.4 82.3 81.5  PLT 135* 144* 144* 185 427   Basic Metabolic Panel:  Recent Labs Lab 03/02/17 0415 03/03/17 0326 03/04/17 0424 03/05/17 0341 03/05/17 2142 03/06/17 0342  NA 139 137 137 141 140 138  K 3.3* 3.5 3.6 3.7 3.7 3.5  CL 102 101 101 103 103 102  CO2 33* 33* 31 33* 32 32  GLUCOSE 133* 136* 169* 153* 148* 154*  BUN 13 12 15 17 18 19   CREATININE 0.84 0.79 0.82 0.76 0.75 0.76  CALCIUM 8.0* 8.1* 8.1* 8.3* 8.6* 8.5*  MG 1.6* 1.9 1.8 1.8  --  1.8  PHOS 2.4* 2.6 2.4* 3.0  --  3.1   GFR: Estimated Creatinine Clearance: 79.2 mL/min (by C-G formula based on SCr of 0.76 mg/dL). Liver Function Tests:  Recent Labs Lab 03/02/17 0415 03/05/17 2142 03/06/17 0342  AST 25 16 15   ALT 20 13* 12*  ALKPHOS 39 37* 33*  BILITOT 0.2* 0.3 0.1*  PROT 5.6* 6.3* 6.5  ALBUMIN 2.3* 2.4* 2.4*   No results for input(s): LIPASE, AMYLASE in the last 168 hours. No results for input(s): AMMONIA in the last 168 hours. Coagulation Profile: No results for input(s): INR, PROTIME in the last 168 hours. Cardiac Enzymes:  Recent Labs Lab 03/05/17 2142  TROPONINI <0.03   BNP (last 3 results) No results for input(s): PROBNP in the last 8760 hours. HbA1C: No results for input(s): HGBA1C in the last 72 hours. CBG:  Recent Labs Lab 03/05/17 2105 03/05/17 2302 03/06/17 0314 03/06/17 0926 03/06/17 1222  GLUCAP 143* 158* 153* 130*  163*   Lipid Profile:  Recent Labs  03/06/17 0342  TRIG 129   Thyroid Function Tests: No results for input(s): TSH, T4TOTAL, FREET4, T3FREE, THYROIDAB in the last 72 hours. Anemia Panel: No results for input(s): VITAMINB12, FOLATE, FERRITIN, TIBC, IRON, RETICCTPCT in the last 72 hours. Urine analysis:    Component Value Date/Time   COLORURINE AMBER (A) 02/22/2017 0817   APPEARANCEUR CLOUDY (A) 02/22/2017 0817   LABSPEC 1.024 02/22/2017 0817   PHURINE 5.0 02/22/2017 0817   GLUCOSEU NEGATIVE 02/22/2017 0817   HGBUR LARGE (A) 02/22/2017 0817   BILIRUBINUR NEGATIVE 02/22/2017 0817   KETONESUR 5 (A) 02/22/2017 0817   PROTEINUR 100 (A) 02/22/2017 0817   UROBILINOGEN 0.2 10/04/2013 2134   NITRITE NEGATIVE 02/22/2017 0817   LEUKOCYTESUR TRACE (A) 02/22/2017 0817   Sepsis Labs: @LABRCNTIP (procalcitonin:4,lacticidven:4)  ) No results found for this  or any previous visit (from the past 240 hour(s)).       Radiology Studies: No results found.      Scheduled Meds: . chlorhexidine  15 mL Mouth Rinse BID  . Chlorhexidine Gluconate Cloth  6 each Topical Daily  . heparin  5,000 Units Subcutaneous Q8H  . insulin aspart  0-15 Units Subcutaneous Q4H  . insulin glargine  10 Units Subcutaneous Daily  . latanoprost  1 drop Both Eyes QHS  . mouth rinse  15 mL Mouth Rinse QID  . sodium chloride flush  10-40 mL Intracatheter Q12H   Continuous Infusions: . Marland KitchenTPN (CLINIMIX-E) Adult 83 mL/hr at 03/05/17 1723  . sodium chloride Stopped (02/24/17 1600)  . Marland KitchenTPN (CLINIMIX-E) Adult     And  . fat emulsion    . valproate sodium 500 mg (03/06/17 1430)     LOS: 12 days    Time spent: 8min    Domenic Polite, MD [facility] Page via [email or web address], password [email or web address]  If 7PM-7AM, please contact night-coverage [email or web address] Password [facility] 03/06/2017, 3:33 PM

## 2017-03-06 NOTE — Progress Notes (Signed)
MBS completed with son Marden Noble present.  Pt with moderate oral and mild pharyngeal dysphagia without aspiration of any consistency tested. He demonstrates intermittent delayed oral transiting/coordination resulting in premature spillage of boluses into pharynx - poorly controlled.  Mild weakness in pharyngeal swallow - pt with mild residuals without sensation.  Cued dry swallows assist to decrease residuals.  Education live conducted with pt and son.  Educated to ongoing aspiration risk due to his dysphagia and weakness.  Pt was willing to accept intake with son present but otherwise frequently refuses and nutritional risk present.    ? Start clears vs fulls  Strict precautions Medicine with puree/pudding or applesauce Intermittent double swallow Esophageal/reflux precautions    Luanna Salk, [facility] SLP [phone]

## 2017-03-06 NOTE — Progress Notes (Signed)
PT Cancellation Note  Patient Details Name: Derek Crawford MRN: [id number] DOB: Feb 03, 1933   Cancelled Treatment:     pt just returned from [facility] too fatigued to attempt any further OOB activity.  Pt has been evaluated with rec to return to SNF.    Rica Koyanagi  PTA [facility]  Acute  Rehab Pager      [phone]

## 2017-03-06 NOTE — Progress Notes (Addendum)
  Speech Language Pathology Treatment: Dysphagia  Patient Details Name: Derek Crawford MRN: [id number] DOB: [birth year] Today's Date: 03/06/2017 Time: 0900 (additional time with son on phone 15 minutes total - 2 calls)-0909- 9 minutes  SLP Time Calculation (min) (ACUTE ONLY): 9 min  Assessment / Plan / Recommendation Clinical Impression  Pt seen today for po trials - voice is clear but pt with decreased breath control with ability to phonate 2-3 words before becoming breathy - indicative of diaphragmatic function.  Pt declined trials repeating "I don't want it", "I just don't want it" many times.  Pt did not expand on reasoning even with choices (suspect aphasia impairing communication).  Spoke to son re: concern for pt dysphagia- worse since SLP MBS in 2017, lack of desire for po regardless of swallow function and possible options (clinical eval or MBS if MD agrees).  MD in agreement for MBS and pt on schedule for 1230 today.  Advised that pt will not consume palatable items and he may decline barium - Son wishes to proceed with MBS.    Informed pt of plan.     2 attempts to call son re: plan made with phone going to voice mail.  Will continue efforts.     HPI HPI: 83 yo M NH resident presented with ileus and difficulty of breathing. CT abdomen confirmed ileus and blood work showed acute renal failure with signs of sepsis.  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  Pt had NG placed but it is now removed and he was placed on clears.  Pt was coughing with all intake yesterday per RN.  MBS conducted 2017 as an OP showed mild difficulties with no aspiration/penetration.        SLP Plan  MBS (today)       Recommendations  Diet recommendations: NPO Medication Administration: Via alternative means                Oral Care Recommendations: Oral care QID SLP Visit Diagnosis: Dysphagia, oropharyngeal phase (R13.12) Plan: MBS (today)       GO                Derek Crawford, [facility] SLP [phone]  Derek Crawford 03/06/2017, 9:33 AM

## 2017-03-06 NOTE — Progress Notes (Signed)
Daily Progress Note   Patient Name: Derek Crawford       Date: 03/06/2017 DOB: 1932-10-26  Age: 83 y.o. MRN#: [id number] Attending Physician: Domenic Polite, MD Primary Care Physician: No primary care provider on file. Admit Date: 02/22/2017  Reason for Consultation/Follow-up: Establishing goals of care  Subjective: Derek Crawford is lying in bed shaking his head and says " Lord help". He is confused. He denies pain. He is scheduled for a MBS today, and patient does not want the test. I spoke with patient's son Derek Crawford) via telephone, and he states patient can be stubborn and would like to contiinue plans for the test. Met with Derek Crawford outside patient's room and discussed current medical issues, trajectory, and prognosis. Will reevaluate tomorrow, patient leaving for MBS.   [name] inquired about creating POA papers and chaplain consult was placed to assist.     Length of Stay: 12  Current Medications: Scheduled Meds:  . chlorhexidine  15 mL Mouth Rinse BID  . Chlorhexidine Gluconate Cloth  6 each Topical Daily  . furosemide  40 mg Intravenous Once  . heparin  5,000 Units Subcutaneous Q8H  . insulin aspart  0-15 Units Subcutaneous Q4H  . insulin glargine  10 Units Subcutaneous Daily  . latanoprost  1 drop Both Eyes QHS  . mouth rinse  15 mL Mouth Rinse QID  . sodium chloride flush  10-40 mL Intracatheter Q12H    Continuous Infusions: . Marland KitchenTPN (CLINIMIX-E) Adult 83 mL/hr at 03/05/17 1723  . sodium chloride Stopped (02/24/17 1600)  . Marland KitchenTPN (CLINIMIX-E) Adult     And  . fat emulsion    . magnesium sulfate 1 - 4 g bolus IVPB    . potassium chloride    . valproate sodium Stopped (03/06/17 0620)    PRN Meds: sodium chloride, albuterol, diphenhydrAMINE, morphine injection, sodium chloride  flush  Physical Exam  Constitutional: No distress.  HENT:  Head: Normocephalic and atraumatic.  Eyes: EOM are normal.  Cardiovascular:  Warm and dry  Pulmonary/Chest: Effort normal. No respiratory distress.  Abdominal: He exhibits distension.  Neurological: He is alert.            Vital Signs: BP (!) 115/44 (BP Location: Left Arm)   Pulse 65   Temp 98.2 F (36.8 C) (Oral)   Resp 18  Ht _0  (1.753 m)   Wt 90.7 kg (199 lb 15.3 oz)   SpO2 95%   BMI 29.53 kg/m  SpO2: SpO2: 95 % O2 Device: O2 Device: Not Delivered O2 Flow Rate: O2 Flow Rate (L/min): 4 L/min  Intake/output summary:  Intake/Output Summary (Last 24 hours) at 03/06/17 1152 Last data filed at 03/06/17 0900  Gross per 24 hour  Intake          2241.15 ml  Output                0 ml  Net          2241.15 ml   LBM: Last BM Date: 03/05/17 (per night shift rn) Baseline Weight: Weight: 90.7 kg (200 lb) Most recent weight: Weight: 90.7 kg (199 lb 15.3 oz)       Palliative Assessment/Data: 30%      Patient Active Problem List   Diagnosis Date Noted  . Sepsis ([place])   . Dysphagia   . Encounter for palliative care   . Goals of care, counseling/discussion   . Abdominal distention   . Respiratory failure ([place]) 02/22/2017  . Ileus ([place])   . Encounter for central line placement   . Depressed mood 12/09/2013  . Allergic rhinitis 12/09/2013  . Psychosis 10/21/2013  . BPH (benign prostatic hyperplasia) 09/16/2013  . Large Tubular adenoma right colon 08/23/2013  . Unspecified constipation 08/07/2013  . Hypertension   . Hyperlipidemia   . Urinary incontinence   . Iron deficiency anemia 05/22/2013  . Dyslipidemia 05/22/2013  . Hemiplegia ([place]) 04/10/2007  . Coronary atherosclerosis 04/10/2007  . CVA 04/10/2007  . GERD 04/10/2007  . GLAUCOMA, HX OF 04/10/2007  . PERCUTANEOUS TRANSLUMINAL CORONARY ANGIOPLASTY, HX OF 04/10/2007    Palliative Care Assessment & Plan   Patient Profile:  83 year old  gentleman nursing home resident prior past medical history of stroke with right-sided weakness seizures, diabetes, reflux, baseline status of debility/wheelchair bound. Patient was initially admitted to the ICU and has been hospitalized since August 15. Initially for respiratory failure from aspiration pneumonia and colonic ileus. Patient was also intubated during early part of this hospitalization. He has been on broad-spectrum antibiotics. He has been followed by gastroenterology and general surgery. The patient underwent flexible sigmoidoscopy and has been regularly followed by speech and swallow therapy.   Assessment: Derek Crawford is resting in bed. He is confused and appears uncomfortable but denies pain. His son is present today.   Recommendations/Plan:  MBS today, will reevaluate tomorrow following results. Extensive discussions with son about issues relating to artifical nutrition and hydration.   Ongoing overall goals of care discussions with son.   Chaplain service consulted for creation of POA papers as requested by son.   Goals of Care and Additional Recommendations:  Full code at this time.   Code Status:    Code Status Orders        Start     Ordered   02/22/17 0651  Full code  Continuous     02/22/17 0651    Code Status History    Date Active Date Inactive Code Status Order ID Comments User Context   This patient has a current code status but no historical code status.       Prognosis:   < 6 months Depending on oral intake and swallow eval and adherence to SLP recs.   Discharge Planning:  SNF rehab with palliative services on d/c.   Care plan was discussed with son Derek Crawford.  Thank you for allowing the Palliative Medicine Team to assist in the care of this patient.   Time In: 12:15 Time Out: 13:20 Total Time 65 minutes Prolonged Time Billed No      Greater than 50%  of this time was spent counseling and coordinating care related to the above assessment and  plan.  Asencion Gowda, NP Loistine Chance MD [phone]) Please contact Palliative Medicine Team phone at [phone] for questions and concerns.

## 2017-03-06 NOTE — Care Management Important Message (Signed)
Important Message  Patient Details  Name: Derek Crawford MRN: [id number] Date of Birth: [birth year]   Medicare Important Message Given:  Yes    Kerin Salen 03/06/2017, 11:38 AMImportant Message  Patient Details  Name: Derek Crawford MRN: [id number] Date of Birth: 12-31-1932   Medicare Important Message Given:  Yes    Kerin Salen 03/06/2017, 11:38 AM

## 2017-03-06 NOTE — Progress Notes (Signed)
[place] NOTE   Pharmacy Consult for TPN Indication: prolonged ileus  Patient Measurements: Height: 5\' 9"  (175.3 cm) Weight: 199 lb 15.3 oz (90.7 kg) IBW/kg (Calculated) : 70.7  AdjBW: 76kg  Body mass index is 29.53 kg/m.   HPI: 83 yo M admitted from NH on 02/22/17 with acute respiratory failure due from aspiration pneumonia and colonic ileus.  Intubated 8/15-20, ileus treated with NG decompression.  He is not a candidate for tube feedings due to hiatal hernia/risk of aspiration.   Pharmacy consulted to start TPN while patient is NPO.   Significant events:  8/20 Extubated and NGT removed.  MD to consider trickle feeds, but no NGT/OGT in place at this time. 8/21 Changed from ICU to telemetry status, will resume lipids in TPN dosing.  Xray shows persistent SB ileus. 8/23 Flex sigmoidoscopy showed no distal obstructing lesions  Insulin requirements past 24 hours: 15 units Novolog and Lantus 10 units daily started 8/25  Current Nutrition: NPO, evaluated by SLP 8/22 as severe aspiration risk  IVF: none  Central access:  CVC triple lumen placed 8/15 TPN start date: 8/18  ASSESSMENT                                                                                                          Today. 03/06/2017:   Glucose:  CBGs trending down after start of Lantus 10 units daily but still not at goal.  Hx DM on Lantus 25 units/day PTA  Electrolytes:  WNL but K and Mag  on very low end of normal  Renal:  WNL, stable  LFTs:  WNL (8/20)  TGs:  122 (8/20), 129 (8/27)  Prealbumin:  9.6 (8/20), 18 (8/27)   NUTRITIONAL GOALS                                                                                             RD recs (8/20):  90-100 Protein, 1815-2085 Kcal Clinimix 5/15 at a goal rate of 83 ml/hr + 20% fat emulsion 240 ml/day to provide: 100 g of protein and 1894 kCals per day meeting 100% of protein and 100% of kCal needs   PLAN       KCl  94meq IV x 2 Mag sulfate 1gm IV x 1  At 1800 today:  Continue Clinimix E 5/15 at goal rate of 83 ml/hr    Continue 20% fat emulsion to 60ml/hr over 12 hr.   TPN to contain standard multivitamins and trace elements daily.  Continue CBGs and moderate SSI q4h for now and Lantus 10 units SQ daily   TPN lab panels on Mondays & Thursdays.  BMET, Mag, Phos in AM.   Dolly Rias RPh 03/06/2017, 10:13 AM Pager [phone]

## 2017-03-07 LAB — BASIC METABOLIC PANEL
ANION GAP: 5 (ref 5–15)
BUN: 21 mg/dL — ABNORMAL HIGH (ref 6–20)
CALCIUM: 8.3 mg/dL — AB (ref 8.9–10.3)
CHLORIDE: 102 mmol/L (ref 101–111)
CO2: 32 mmol/L (ref 22–32)
Creatinine, Ser: 0.91 mg/dL (ref 0.61–1.24)
GFR calc Af Amer: >60 mL/min (ref >60)
GFR calc non Af Amer: >60 mL/min (ref >60)
GLUCOSE: 166 mg/dL — AB (ref 65–99)
Potassium: 3.4 mmol/L — ABNORMAL LOW (ref 3.5–5.1)
Sodium: 139 mmol/L (ref 135–145)

## 2017-03-07 LAB — GLUCOSE, CAPILLARY
GLUCOSE-CAPILLARY: 150 mg/dL — AB (ref 65–99)
GLUCOSE-CAPILLARY: 157 mg/dL — AB (ref 65–99)
GLUCOSE-CAPILLARY: 162 mg/dL — AB (ref 65–99)
Glucose-Capillary: 153 mg/dL — ABNORMAL HIGH (ref 65–99)
Glucose-Capillary: 162 mg/dL — ABNORMAL HIGH (ref 65–99)
Glucose-Capillary: 174 mg/dL — ABNORMAL HIGH (ref 65–99)

## 2017-03-07 LAB — PHOSPHORUS: Phosphorus: 3.1 mg/dL (ref 2.5–4.6)

## 2017-03-07 LAB — MAGNESIUM: Magnesium: 1.7 mg/dL (ref 1.7–2.4)

## 2017-03-07 MED ORDER — POTASSIUM CHLORIDE 10 MEQ/100ML IV SOLN
10.0000 meq | INTRAVENOUS | Status: AC
  Administered 2017-03-07 (×4): 10 meq via INTRAVENOUS
  Filled 2017-03-07 (×4): qty 100

## 2017-03-07 MED ORDER — ASPIRIN 81 MG PO CHEW
81.0000 mg | CHEWABLE_TABLET | Freq: Every day | ORAL | Status: DC
  Administered 2017-03-08 – 2017-03-14 (×7): 81 mg via ORAL
  Filled 2017-03-07 (×8): qty 1

## 2017-03-07 MED ORDER — TRACE MINERALS CR-CU-MN-SE-ZN 10-1000-500-60 MCG/ML IV SOLN
INTRAVENOUS | Status: AC
  Administered 2017-03-07: 18:00:00 via INTRAVENOUS
  Filled 2017-03-07: qty 1992

## 2017-03-07 MED ORDER — MAGNESIUM SULFATE 2 GM/50ML IV SOLN
2.0000 g | Freq: Once | INTRAVENOUS | Status: AC
  Administered 2017-03-07: 2 g via INTRAVENOUS
  Filled 2017-03-07: qty 50

## 2017-03-07 MED ORDER — ATORVASTATIN CALCIUM 10 MG PO TABS
10.0000 mg | ORAL_TABLET | Freq: Every day | ORAL | Status: DC
  Administered 2017-03-08 – 2017-03-12 (×5): 10 mg via ORAL
  Filled 2017-03-07 (×5): qty 1

## 2017-03-07 MED ORDER — FAT EMULSION 20 % IV EMUL
240.0000 mL | INTRAVENOUS | Status: AC
  Administered 2017-03-07: 240 mL via INTRAVENOUS
  Filled 2017-03-07: qty 250

## 2017-03-07 MED ORDER — DIPHENHYDRAMINE HCL 50 MG/ML IJ SOLN
25.0000 mg | Freq: Once | INTRAMUSCULAR | Status: AC
  Administered 2017-03-07: 25 mg via INTRAVENOUS
  Filled 2017-03-07: qty 1

## 2017-03-07 NOTE — Progress Notes (Signed)
Daily Progress Note   Patient Name: Derek Crawford       Date: 03/07/2017 DOB: 06/18/1933  Age: 83 y.o. MRN#: [id number] Attending Physician: Domenic Polite, MD Primary Care Physician: No primary care provider on file. Admit Date: 02/22/2017  Reason for Consultation/Follow-up: Establishing goals of care  Subjective: Derek Crawford is sitting in bedside chair today. He denies pain or discomfort. MBS yesterday with recs for oral diet. Patient encouraged to increase oral intake per SLP recommended diet. TPN remains in place.   8/27: Derek Crawford inquired about creating POA papers and chaplain consult was placed to assist.     Length of Stay: 13  Current Medications: Scheduled Meds:  . chlorhexidine  15 mL Mouth Rinse BID  . Chlorhexidine Gluconate Cloth  6 each Topical Daily  . heparin  5,000 Units Subcutaneous Q8H  . insulin aspart  0-15 Units Subcutaneous Q4H  . insulin glargine  10 Units Subcutaneous Daily  . latanoprost  1 drop Both Eyes QHS  . mouth rinse  15 mL Mouth Rinse QID  . sodium chloride flush  10-40 mL Intracatheter Q12H    Continuous Infusions: . sodium chloride 250 mL (03/07/17 1332)  . Marland KitchenTPN (CLINIMIX-E) Adult     And  . fat emulsion    . Marland KitchenTPN (CLINIMIX-E) Adult 83 mL/hr at 03/06/17 1726  . valproate sodium 500 mg (03/07/17 1349)    PRN Meds: sodium chloride, albuterol, morphine injection, sodium chloride flush  Physical Exam  Constitutional: No distress.  HENT:  Head: Normocephalic and atraumatic.  Eyes: EOM are normal.  Cardiovascular:  Warm and dry  Pulmonary/Chest: Effort normal. No respiratory distress.  Abdominal: He exhibits distension.  Neurological: He is alert.            Vital Signs: BP (!) 104/53 (BP Location: Right Arm)   Pulse (!) 58   Temp 98.7 F  (37.1 C) (Oral)   Resp 20   Ht 5\' 9"  (1.753 m)   Wt 89.3 kg (196 lb 13.9 oz)   SpO2 93%   BMI 29.07 kg/m  SpO2: SpO2: 93 % O2 Device: O2 Device: Not Delivered O2 Flow Rate: O2 Flow Rate (L/min): 4 L/min  Intake/output summary:   Intake/Output Summary (Last 24 hours) at 03/07/17 1408 Last data filed at 03/07/17 1339  Gross per 24 hour  Intake  1462.7 ml  Output             2000 ml  Net           -537.3 ml   LBM: Last BM Date: 03/05/17 Baseline Weight: Weight: 90.7 kg (200 lb) Most recent weight: Weight: 89.3 kg (196 lb 13.9 oz)       Palliative Assessment/Data: 40%      Patient Active Problem List   Diagnosis Date Noted  . Sepsis ([place])   . Dysphagia   . Encounter for palliative care   . Goals of care, counseling/discussion   . Abdominal distention   . Respiratory failure ([place]) 02/22/2017  . Ileus ([place])   . Encounter for central line placement   . Depressed mood 12/09/2013  . Allergic rhinitis 12/09/2013  . Psychosis 10/21/2013  . BPH (benign prostatic hyperplasia) 09/16/2013  . Large Tubular adenoma right colon 08/23/2013  . Unspecified constipation 08/07/2013  . Hypertension   . Hyperlipidemia   . Urinary incontinence   . Iron deficiency anemia 05/22/2013  . Dyslipidemia 05/22/2013  . Hemiplegia ([place]) 04/10/2007  . Coronary atherosclerosis 04/10/2007  . CVA 04/10/2007  . GERD 04/10/2007  . GLAUCOMA, HX OF 04/10/2007  . PERCUTANEOUS TRANSLUMINAL CORONARY ANGIOPLASTY, HX OF 04/10/2007    Palliative Care Assessment & Plan   Patient Profile:  83 year old gentleman nursing home resident prior past medical history of stroke with right-sided weakness seizures, diabetes, reflux, baseline status of debility/wheelchair bound. Patient was initially admitted to the ICU and has been hospitalized since August 15. Initially for respiratory failure from aspiration pneumonia and colonic ileus. Patient was also intubated during early part of this hospitalization.  He has been on broad-spectrum antibiotics. He has been followed by gastroenterology and general surgery. The patient underwent flexible sigmoidoscopy and has been regularly followed by speech and swallow therapy.   Assessment: Derek Crawford is sitting in bedside chair, he appears to be in good spirits. He appears improved cognitively from yesterday.   Recommendations/Plan:  MBS yesterday, cleared to a liquid diet. Patient encouraged to increase oral intake.    Recommend calorie count when feasible.   Patient remains on TPN  Will reevaluate tomorrow.     Chaplain service consulted 8/27 for creation of POA papers as requested by Derek Crawford.   Goals of Care and Additional Recommendations:  Full code at this time.   Code Status:    Code Status Orders        Start     Ordered   02/22/17 0651  Full code  Continuous     02/22/17 0651    Code Status History    Date Active Date Inactive Code Status Order ID Comments User Context   This patient has a current code status but no historical code status.       Prognosis:   < 6 months Depending on oral intake, TPN continuation, and swallow eval and adherence to SLP recs.   Discharge Planning:  SNF rehab with palliative services on d/c.    Thank you for allowing the Palliative Medicine Team to assist in the care of this patient.   Time In: 12:15 Time Out: 12:45 Total Time 30 minutes Prolonged Time Billed No      Greater than 50%  of this time was spent counseling and coordinating care related to the above assessment and plan.  Derek Gowda, NP  Please contact Palliative Medicine Team phone at [phone] for questions and concerns.   I saw and examined Derek Crawford  in conjunction with Derek Crawford.  Please see above for details.  Micheline Rough, MD [place] Team [phone]

## 2017-03-07 NOTE — Progress Notes (Signed)
Nutrition Follow-up  DOCUMENTATION CODES:   Not applicable  INTERVENTION:   Continue TPN per pharmacy recommendations  Monitor for GOC post palliative care meeting  Monitor and supplement electrolytes as need per MD descretion.   NUTRITION DIAGNOSIS:   Inadequate oral intake related to inability to eat as evidenced by NPO status.  Ongoing  GOAL:   Patient will meet greater than or equal to 90% of their needs  Meeting with TPN goal rate  MONITOR:   Weight trends, Labs, I & O's, Other (Comment) (TPN regimen)  REASON FOR ASSESSMENT:   Consult New TPN/TNA  ASSESSMENT:   83 y.o. 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  Required intubation after arrival to ICU. Surgery consulted for ileus.   8/15- colonic ileus detected 8/22- failed swallow evaluation 8/23- s/p flex sigmoidoscopy- rule out obstruction, ongoing intermittent ileus 8/24- failed swallow evaluation again 8/27- Pt upgraded to full liquid diet  Pt confused upon visit. Potassium noted to be low, pharmacy following. Awaiting palliative care meeting post failed MBS, for ongoing goals of care. Wt noted to be stable for last five days. Pt +5602 ml fluid balance. Will continue to monitor weight trends and electrolytes.   Clinimix 5/15 at a goal rate of 83 ml/hr + 20% fat emulsion 240 ml/day to provide: 100 g of protein and 1894 kCals per day meeting 100% of protein and 100% of kCal needs   Medications reviewed and include: SSI, Mag Sulfate, TPN Labs reviewed: K 3.4 (L) CBG 148-166 BUN 21 (H) Calcium 8.3 (L)   Diet Order:  TPN (CLINIMIX-E) Adult Diet full liquid Room service appropriate? Yes; Fluid consistency: Thin  Skin:  Reviewed, no issues  Last BM:  8/23  Height:   Ht Readings from Last 1 Encounters:  02/22/17 5\' 9"  (1.753 m)    Weight:   Wt Readings from Last 1 Encounters:  03/07/17 196 lb 13.9 oz (89.3 kg)     Ideal Body Weight:  72.7 kg  BMI:  Body mass index is 29.07 kg/m.  Estimated Nutritional Needs:   Kcal:  1815-2085 (20-23 kcal/kg)  Protein:  90-100 grams  Fluid:  >1.7L/day   EDUCATION NEEDS:   No education needs identified at this time  [place], LDN Clinical Nutrition Pager # - [phone]

## 2017-03-07 NOTE — Progress Notes (Signed)
[place] NOTE   Pharmacy Consult for TPN Indication: prolonged ileus  Patient Measurements: Height: 5\' 9"  (175.3 cm) Weight: 196 lb 13.9 oz (89.3 kg) IBW/kg (Calculated) : 70.7  AdjBW: 76kg  Body mass index is 29.07 kg/m.   HPI: 83 yo M admitted from NH on 02/22/17 with acute respiratory failure due from aspiration pneumonia and colonic ileus.  Intubated 8/15-20, ileus treated with NG decompression.  He is not a candidate for tube feedings due to hiatal hernia/risk of aspiration.   Pharmacy consulted to start TPN while patient is NPO.   Significant events:  8/20 Extubated and NGT removed.  MD to consider trickle feeds, but no NGT/OGT in place at this time. 8/21 Changed from ICU to telemetry status, will resume lipids in TPN dosing.  Xray shows persistent SB ileus. 8/23 Flex sigmoidoscopy showed no distal obstructing lesions  Insulin requirements past 24 hours: 15 units Novolog and Lantus 10 units daily started 8/25  Current Nutrition: NPO, evaluated by SLP 8/22 as severe aspiration risk  IVF: none  Central access:  CVC triple lumen placed 8/15 TPN start date: 8/18  ASSESSMENT                                                                                                          Today. 03/07/2017:   Glucose:  CBGs slightly above goal.  Hx DM on Lantus 25 units/day PTA  Electrolytes:  K low,  Mag on very low end of normal, all other WNL  Renal:  WNL, stable  LFTs:  WNL (8/20)  TGs:  122 (8/20), 129 (8/27)  Prealbumin:  9.6 (8/20), 18 (8/27)   NUTRITIONAL GOALS                                                                                             RD recs (8/20):  90-100 Protein, 1815-2085 Kcal Clinimix 5/15 at a goal rate of 83 ml/hr + 20% fat emulsion 240 ml/day to provide: 100 g of protein and 1894 kCals per day meeting 100% of protein and 100% of kCal needs   PLAN       KCl 34meq IV x 4 Mag sulfate 2gm IV x 1  At 1800 today:  Continue Clinimix E 5/15 at goal rate of 83 ml/hr    Continue 20% fat emulsion to 71ml/hr over 12 hr.   TPN to contain standard multivitamins and trace elements daily.  Continue CBGs and moderate SSI q4h for now and Lantus 10 units SQ daily   TPN lab panels on Mondays & Thursdays.  F/u GOC post palliative care meeting  BMET, Mag, Phos in AM.   Dolly Rias RPh 03/07/2017, 12:22 PM Pager [phone]

## 2017-03-07 NOTE — Progress Notes (Signed)
Physical Therapy Treatment Patient Details Name: Derek Crawford MRN: [id number] DOB: [birth year] Today's Date: 03/07/2017    History of Present Illness 83 yo M NH resident presented with ileus and difficulty of breathing. CT abdomen confirmed ileus and blood work showed acute renal failure with signs of sepsis.  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  Pt would open eyes briefly and respond to name only.  Perorrmed side to side rolling to increase cognition.  Pt remained groggy/sleepy.  Attempted sitting EOB howeever unable.  Used Maxi Lift to transfer from bed to recliner per nursing request and positioned to comfort.   Follow Up Recommendations  SNF     Equipment Recommendations  None recommended by PT    Recommendations for Other Services       Precautions / Restrictions Precautions Precautions: Fall Restrictions Weight Bearing Restrictions: No    Mobility  Bed Mobility Overal bed mobility: Needs Assistance Bed Mobility: Rolling Rolling: Total assist;+2 for physical assistance;+2 for safety/equipment (pt 0%)         General bed mobility comments: side to side rolling to perform hygiene and place HOYER Lift pad under pt.    Transfers                 General transfer comment: used Maxi Move to assist OOB to recliner per nursing request  Ambulation/Gait                 Stairs            Wheelchair Mobility    Modified Rankin (Stroke Patients Only)       Balance                                            Cognition Arousal/Alertness: Lethargic Behavior During Therapy: Flat affect                                   General Comments: difficult to maintain arousal.        Exercises      General Comments        Pertinent Vitals/Pain Pain Assessment: No/denies pain    Home Living                      Prior  Function            PT Goals (current goals can now be found in the care plan section) Progress towards PT goals: Progressing toward goals    Frequency    Min 2X/week      PT Plan Current plan remains appropriate    Co-evaluation              AM-PAC PT "6 Clicks" Daily Activity  Outcome Measure  Difficulty turning over in bed (including adjusting bedclothes, sheets and blankets)?: Unable Difficulty moving from lying on back to sitting on the side of the bed? : Unable Difficulty sitting down on and standing up from a chair with arms (e.g., wheelchair, bedside commode, etc,.)?: Unable Help needed moving to and from a bed to chair (including a wheelchair)?: Total Help needed walking in hospital room?: Total   6 Click Score: 5    End of  Session   Activity Tolerance: Patient limited by lethargy Patient left: in chair;with call bell/phone within reach;with chair alarm set Nurse Communication: Need for lift equipment PT Visit Diagnosis: Muscle weakness (generalized) (M62.81)     Time: 1040-1057 PT Time Calculation (min) (ACUTE ONLY): 17 min  Charges:  $Therapeutic Activity: 8-22 mins                    G Codes:       Rica Koyanagi  PTA [facility]  Acute  Rehab Pager      [phone]

## 2017-03-07 NOTE — Care Management Note (Signed)
Case Management Note  Patient Details  Name: Derek Crawford MRN: [id number] Date of Birth: 03-30-1933  Subjective/Objective:   Respiratory failure                  Action/Plan: Plan to discharge to SNF   Expected Discharge Date:   (UNKNOWN)               Expected Discharge Plan:  Skilled Nursing Facility  In-House Referral:  Clinical Social Work  Discharge planning Services  CM Consult  Post Acute Care Choice:    Choice offered to:     DME Arranged:    DME Agency:     HH Arranged:    [place] Agency:     Status of Service:  Completed, signed off  If discussed at [employer], dates discussed:    Additional CommentsPurcell Mouton, RN 03/07/2017, 12:48 PM

## 2017-03-07 NOTE — Progress Notes (Signed)
PROGRESS NOTE    Derek Crawford  UXN:[id number] DOB: 12-Jun-1933 DOA: 02/22/2017 PCP: No primary care provider on file.  Brief Narrative: 83 year old chronically ill male from a nursing home with CVA and right hemiparesis, seizures,  diabetes, GERD, debility/wheelchair-bound was admitted to the ICU on 8/15 due to respiratory failure from aspiration pneumonia and colonic ileus. CT on admission showed diffuse ileus, suspected cholelithiasis, patchy bilateral airspace opacities was admitted to the ICU, intubated, treated with IV cefepime, also followed by GI and general surgery, treated with NG decompression as well as a rectal tube. Completed 8 days of cefepime on 8/21 -Transferred to [facility] service  8/22, failed swallow eval-high aspiration risk 8/22 with worsening ileus s/p Flex sig on 8/23 8/24: failed swallow eval again, Palliative recommended, Gi signed off and recommended comfort care/palliative 8/26: Palliative meeting: remains Full Code, plan SNF with palliative care when tolerating PO 8/27 completed MBS, moderate oral and mild pharyngeal dysphagia-ordered liquid diet/Po intake -None so far  Assessment & Plan:   1. Acute hypoxic respiratory failure -Due to sepsis/aspiration pneumonia possibly related to ileus and dysphagia -s/p VDRF -completed course of IV cefepime on 8/21 -s/p repeat SLP evaluation 8/24->NPO /high aspiration risk and palliative care meeting recommended -Remains on TNA -I called and discussed situation with patient's son Marden Noble, recommended consideration of comfort focused care and participation in a palliative care meeting, son is resistant to DNR/Hospice at this time -Pt more alert 8/26 onwards hence SLP re-assessment requested, MBS completed: remains mod aspiration risk but started on Full liquids since yesterday, but declines any PO intake -will ask Palliative to Follow along   2. Colonic ileus - treated conservatively with NG decompression and a rectal  tube -Followed by general surgery in ICU -was initially improving and NG tube out and rectal tube removed on 8/21 -then worsened again, s/p flex sig by GI which ruled out obstruction but continues to have ongoing intermittent ileus -severe dysphagia also another challenging issue at this time, per SLP eval in 2017 mild dysphagia then, but now severe and very high aspiration risk -unable to place PEG due to advance age/decreased cognition and anatomically most of stomach is in the chest, GI also recommends palliative care/comfort care, -Palliative care following -Remains on TNA-wean this off when PO intake improves-if this happens -having mucosy BMs intermittently-last was overnight  3. History of CVAs/right hemiparesis  -resume ASA/statin   4. History of seizures  -Continue Depakote by IV, change to Po when oral intake reliable  5. Metabolic encephalopathy  -secondary to #1 and 2  -also on scheduled Reglan and when necessary fentanyl in ICU which I stopped few days back -slight improvement now  6. Diabetes mellitus ,  -lantus on hold, SSI  7. Dysphagia -multifactorial, see above -Suspect mild compensated dysphagia related to previous stroke however exacerbated significantly in the setting of critical illness, long ICU stay and mechanical ventilation,  -s/p SLP re-eval again, high aspiration risk, palliative consult recommended, see above -s/p MBS 8/27 and liquids started-but pt declines any Po intake so far  DVT prophylaxis: Hep SQ Code Status: Full Code Family Communication:none at bedside, called and d/w son Marden Noble 8/23, 8/25 and 8/26 Disposition Plan: To be determined  Consultants:  GI CCS PCCM Transfer  Subjective: -mucosy BM overnight, no vomiting, moans from time to time, " no Po intake"  Objective: Vitals:   03/06/17 1507 03/06/17 2032 03/07/17 0429 03/07/17 1339  BP: (!) 130/52 (!) 142/54 (!) 111/37 (!) 104/53  Pulse: 60 64 63 (!)  58  Resp:  (!) 23 20   Temp:  97.7 F (36.5 C) 98.5 F (36.9 C) 98.7 F (37.1 C) 98.7 F (37.1 C)  TempSrc: Oral Oral Oral Oral  SpO2: 98% 95% 94% 93%  Weight:   89.3 kg (196 lb 13.9 oz)   Height:        Intake/Output Summary (Last 24 hours) at 03/07/17 1406 Last data filed at 03/07/17 1339  Gross per 24 hour  Intake           1462.7 ml  Output             2000 ml  Net           -537.3 ml   Filed Weights   03/04/17 1750 03/05/17 0526 03/07/17 0429  Weight: 90.1 kg (198 lb 10.2 oz) 90.7 kg (199 lb 15.3 oz) 89.3 kg (196 lb 13.9 oz)    Examination:  Gen: elderly frail male, eyes open, mumbles and says few words, very frustrated  HEENT: PERRLA, Neck supple, no JVD Lungs: decreased BS at bases CVS: RRR,No Gallops,Rubs or new Murmurs Abd: soft, Non tender, non distended, BS present Extremities: No Cyanosis, Clubbing or edema Skin: no new rashes Neurology: Chronic right hemiparesis   Data Reviewed:   CBC:  Recent Labs Lab 03/03/17 0810 03/04/17 0424 03/05/17 0341 03/05/17 2142 03/06/17 0342  WBC 6.5 7.1 6.9 6.4 6.7  NEUTROABS  --   --   --   --  4.1  HGB 10.4* 10.2* 10.3* 10.4* 10.6*  HCT 32.3* 31.6* 31.8* 32.5* 32.5*  MCV 80.8 82.5 82.4 82.3 81.5  PLT 135* 144* 144* 185 683   Basic Metabolic Panel:  Recent Labs Lab 03/03/17 0326 03/04/17 0424 03/05/17 0341 03/05/17 2142 03/06/17 0342 03/07/17 0545  NA 137 137 141 140 138 139  K 3.5 3.6 3.7 3.7 3.5 3.4*  CL 101 101 103 103 102 102  CO2 33* 31 33* 32 32 32  GLUCOSE 136* 169* 153* 148* 154* 166*  BUN 12 15 17 18 19  21*  CREATININE 0.79 0.82 0.76 0.75 0.76 0.91  CALCIUM 8.1* 8.1* 8.3* 8.6* 8.5* 8.3*  MG 1.9 1.8 1.8  --  1.8 1.7  PHOS 2.6 2.4* 3.0  --  3.1 3.1   GFR: Estimated Creatinine Clearance: 69.1 mL/min (by C-G formula based on SCr of 0.91 mg/dL). Liver Function Tests:  Recent Labs Lab 03/02/17 0415 03/05/17 2142 03/06/17 0342  AST 25 16 15   ALT 20 13* 12*  ALKPHOS 39 37* 33*  BILITOT 0.2* 0.3 0.1*  PROT 5.6* 6.3*  6.5  ALBUMIN 2.3* 2.4* 2.4*   No results for input(s): LIPASE, AMYLASE in the last 168 hours. No results for input(s): AMMONIA in the last 168 hours. Coagulation Profile: No results for input(s): INR, PROTIME in the last 168 hours. Cardiac Enzymes:  Recent Labs Lab 03/05/17 2142  TROPONINI <0.03   BNP (last 3 results) No results for input(s): PROBNP in the last 8760 hours. HbA1C: No results for input(s): HGBA1C in the last 72 hours. CBG:  Recent Labs Lab 03/06/17 1953 03/07/17 0011 03/07/17 0421 03/07/17 0752 03/07/17 1212  GLUCAP 145* 157* 153* 162* 150*   Lipid Profile:  Recent Labs  03/06/17 0342  TRIG 129   Thyroid Function Tests: No results for input(s): TSH, T4TOTAL, FREET4, T3FREE, THYROIDAB in the last 72 hours. Anemia Panel: No results for input(s): VITAMINB12, FOLATE, FERRITIN, TIBC, IRON, RETICCTPCT in the last 72 hours. Urine analysis:    Component  Value Date/Time   COLORURINE AMBER (A) 02/22/2017 0817   APPEARANCEUR CLOUDY (A) 02/22/2017 0817   LABSPEC 1.024 02/22/2017 0817   PHURINE 5.0 02/22/2017 0817   GLUCOSEU NEGATIVE 02/22/2017 0817   HGBUR LARGE (A) 02/22/2017 0817   BILIRUBINUR NEGATIVE 02/22/2017 0817   KETONESUR 5 (A) 02/22/2017 0817   PROTEINUR 100 (A) 02/22/2017 0817   UROBILINOGEN 0.2 10/04/2013 2134   NITRITE NEGATIVE 02/22/2017 0817   LEUKOCYTESUR TRACE (A) 02/22/2017 0817   Sepsis Labs: @LABRCNTIP (procalcitonin:4,lacticidven:4)  ) No results found for this or any previous visit (from the past 240 hour(s)).       Radiology Studies: Dg Swallowing Func-speech Pathology  Result Date: 03/06/2017 Objective Swallowing Evaluation: Type of Study: MBS-Modified Barium Swallow Study Patient Details Name: Eivin Mascio MRN: [id number] Date of Birth: 24-Feb-1933 Today's Date: 03/06/2017 Time: SLP Start Time (ACUTE ONLY): 1301-SLP Stop Time (ACUTE ONLY): 1330 SLP Time Calculation (min) (ACUTE ONLY): 29 min Past Medical History: Past  Medical History: Diagnosis Date . Diabetes mellitus without complication ([place])  . GERD (gastroesophageal reflux disease)  . Glaucoma  . Hemiplegia following CVA (cerebrovascular accident) ([place])   R side weakness . Hyperlipidemia  . Hypertension  . Iron deficiency anemia  . Muscle weakness  . Urinary incontinence  Past Surgical History: Past Surgical History: Procedure Laterality Date . CATARACT EXTRACTION Left  . CIRCUMCISION   . COLONOSCOPY N/A 08/23/2013  Procedure: COLONOSCOPY;  Surgeon: Gatha Mayer, MD;  Location: [facility] ENDOSCOPY;  Service: Endoscopy;  Laterality: N/A; . ESOPHAGOGASTRODUODENOSCOPY N/A 08/23/2013  Procedure: ESOPHAGOGASTRODUODENOSCOPY (EGD);  Surgeon: Gatha Mayer, MD;  Location: [facility];  Service: Endoscopy;  Laterality: N/A; . FLEXIBLE SIGMOIDOSCOPY N/A 03/02/2017  Procedure: FLEXIBLE SIGMOIDOSCOPY;  Surgeon: [name], MD;  Location: [facility] ENDOSCOPY;  Service: Endoscopy;  Laterality: N/A; . TONSILLECTOMY   HPI: 83 yo M NH resident presented with ileus and difficulty of breathing. CT abdomen confirmed ileus and blood work showed acute renal failure with signs of sepsis.  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  Pt had NG placed but it is now removed and he was placed on clears.  Pt was coughing with all intake yesterday per RN.  MBS conducted 2017 as an OP showed mild difficulties with no aspiration/penetration.   Subjective: pt awake in chair, son Marden Noble present Assessment / Plan / Recommendation CHL IP CLINICAL IMPRESSIONS 03/06/2017 Clinical Impression MBS completed with son Marden Noble present.  Pt with moderate oral and mild pharyngeal dysphagia without aspiration of any consistency tested. He demonstrates delayed oral transiting/coordination resulting in lingual pumping, premature spillage and piecemealing. Delay with oral transit was up to 7 seconds with liquid even with max cue -suspect d/t prior cva and motor planning.  Mild weakness in pharyngeal swallow -  pt with mild residuals without sensation.  Also appeared with secretion retention during MBS in pharynx without ability to fully clear.  Cued dry swallows assist to decrease residuals.  Education live conducted with pt and son. Upon esophageal sweep, pt appeared clear.  Educated to ongoing aspiration risk due to his dysphagia and weakness.  Pt was willing to accept intake with son present but otherwise frequently refuses and nutritional risk present.  SLP Visit Diagnosis Dysphagia, oropharyngeal phase (R13.12) Attention and concentration deficit following -- Frontal lobe and executive function deficit following -- Impact on safety and function Moderate aspiration risk;Risk for inadequate nutrition/hydration   CHL IP TREATMENT RECOMMENDATION 03/06/2017 Treatment Recommendations Therapy as outlined in treatment plan below  Prognosis 03/06/2017 Prognosis for Safe Diet Advancement Fair Barriers to Reach Goals Motivation Barriers/Prognosis Comment -- CHL IP DIET RECOMMENDATION 03/06/2017 SLP Diet Recommendations Thin liquid Liquid Administration via Cup;Straw Medication Administration Whole meds with puree Compensations Slow rate;Small sips/bites Postural Changes Remain semi-upright after after feeds/meals (Comment);Seated upright at 90 degrees   CHL IP OTHER RECOMMENDATIONS 03/06/2017 Recommended Consults -- Oral Care Recommendations Oral care BID Other Recommendations --   CHL IP FOLLOW UP RECOMMENDATIONS 03/06/2017 Follow up Recommendations Skilled Nursing facility   [facility] IP FREQUENCY AND DURATION 03/06/2017 Speech Therapy Frequency (ACUTE ONLY) min 2x/week Treatment Duration 1 week      CHL IP ORAL PHASE 03/06/2017 Oral Phase Impaired Oral - Pudding Teaspoon -- Oral - Pudding Cup -- Oral - Honey Teaspoon -- Oral - Honey Cup -- Oral - Nectar Teaspoon Weak lingual manipulation;Lingual pumping;Left pocketing in lateral sulci;Piecemeal swallowing;Delayed oral transit Oral - Nectar Cup Weak lingual manipulation;Lingual  pumping;Piecemeal swallowing;Delayed oral transit Oral - Nectar Straw Weak lingual manipulation;Lingual pumping;Reduced posterior propulsion;Piecemeal swallowing;Delayed oral transit;Premature spillage Oral - Thin Teaspoon Weak lingual manipulation;Lingual pumping;Reduced posterior propulsion;Piecemeal swallowing;Delayed oral transit;Premature spillage Oral - Thin Cup Weak lingual manipulation;Lingual pumping;Piecemeal swallowing;Delayed oral transit;Premature spillage Oral - Thin Straw Weak lingual manipulation;Lingual pumping;Piecemeal swallowing;Delayed oral transit;Premature spillage Oral - Puree Delayed oral transit;Weak lingual manipulation Oral - Mech Soft Weak lingual manipulation;Impaired mastication;Premature spillage;Delayed oral transit Oral - Regular -- Oral - Multi-Consistency -- Oral - Pill -- Oral Phase - Comment delay in oral transitng up to 7 seconds with liquids despite max cues -   CHL IP PHARYNGEAL PHASE 03/06/2017 Pharyngeal Phase Impaired Pharyngeal- Pudding Teaspoon -- Pharyngeal -- Pharyngeal- Pudding Cup -- Pharyngeal -- Pharyngeal- Honey Teaspoon -- Pharyngeal -- Pharyngeal- Honey Cup -- Pharyngeal -- Pharyngeal- Nectar Teaspoon -- Pharyngeal -- Pharyngeal- Nectar Cup -- Pharyngeal -- Pharyngeal- Nectar Straw -- Pharyngeal -- Pharyngeal- Thin Teaspoon Reduced epiglottic inversion;Pharyngeal residue - valleculae;Pharyngeal residue - pyriform Pharyngeal -- Pharyngeal- Thin Cup Reduced epiglottic inversion;Pharyngeal residue - valleculae;Pharyngeal residue - pyriform Pharyngeal -- Pharyngeal- Thin Straw Reduced epiglottic inversion;Pharyngeal residue - valleculae;Pharyngeal residue - pyriform Pharyngeal -- Pharyngeal- Puree Delayed swallow initiation-vallecula;WFL Pharyngeal -- Pharyngeal- Mechanical Soft Delayed swallow initiation-vallecula;WFL Pharyngeal -- Pharyngeal- Regular NT Pharyngeal -- Pharyngeal- Multi-consistency NT Pharyngeal -- Pharyngeal- Pill NT Pharyngeal -- Pharyngeal Comment  pt does not sense residuals, cues to dry swallow decrease residuals but not fully clear,  appearance of cervical ostephytes may contribute to decreased clearance at pyriform sinus/cricopharyngeus  CHL IP CERVICAL ESOPHAGEAL PHASE 03/06/2017 Cervical Esophageal Phase Impaired Pudding Teaspoon -- Pudding Cup -- Honey Teaspoon -- Honey Cup -- Nectar Teaspoon -- Nectar Cup -- Nectar Straw -- Thin Teaspoon -- Thin Cup -- Thin Straw -- Puree -- Mechanical Soft -- Regular -- Multi-consistency -- Pill -- Cervical Esophageal Comment appearance of cervical ostephytes appeared may contribute to pyriform sinus/UES residuals, pt appeared with secretion retention retained in pharynx CHL IP GO 01/15/2016 Functional Assessment Tool Used mbs, clinical judgement Functional Limitations Swallowing Swallow Current Status (W4315) CI Swallow Goal Status (Q0086) CI Swallow Discharge Status (P6195) CI Motor Speech Current Status (K9326) (None) Motor Speech Goal Status (Z1245) (None) Motor Speech Goal Status (Y0998) (None) Spoken Language Comprehension Current Status (P3825) (None) Spoken Language Comprehension Goal Status (K5397) (None) Spoken Language Comprehension Discharge Status (Q7341) (None) Spoken Language Expression Current Status (P3790) (None) Spoken Language Expression Goal Status (W4097) (None) Spoken Language Expression Discharge Status (D5329) (None) Attention Current Status (J2426) (None) Attention Goal Status (S3419) (None) Attention Discharge Status (Q2229) (None) Memory Current  Status [phone]) (None) Memory Goal Status [phone]) (None) Memory Discharge Status [phone]) (None) Voice Current Status (K3838) (None) Voice Goal Status (F8403) (None) Voice Discharge Status [phone]) (None) Other Speech-Language Pathology Functional Limitation Current Status (G6770) (None) Other Speech-Language Pathology Functional Limitation Goal Status (H4035) (None) Other Speech-Language Pathology Functional Limitation Discharge Status [phone]) (None)  Macario Golds 03/06/2017, 7:52 PM  Luanna Salk, MS [facility] SLP [phone]                  Scheduled Meds: . chlorhexidine  15 mL Mouth Rinse BID  . Chlorhexidine Gluconate Cloth  6 each Topical Daily  . heparin  5,000 Units Subcutaneous Q8H  . insulin aspart  0-15 Units Subcutaneous Q4H  . insulin glargine  10 Units Subcutaneous Daily  . latanoprost  1 drop Both Eyes QHS  . mouth rinse  15 mL Mouth Rinse QID  . sodium chloride flush  10-40 mL Intracatheter Q12H   Continuous Infusions: . sodium chloride 250 mL (03/07/17 1332)  . Marland KitchenTPN (CLINIMIX-E) Adult     And  . fat emulsion    . Marland KitchenTPN (CLINIMIX-E) Adult 83 mL/hr at 03/06/17 1726  . valproate sodium 500 mg (03/07/17 1349)     LOS: 13 days    Time spent: 38min    Domenic Polite, MD [facility] Page via [email or web address], password [email or web address]  If 7PM-7AM, please contact night-coverage [email or web address] Password [email or web address] 03/07/2017, 2:06 PM

## 2017-03-08 LAB — BASIC METABOLIC PANEL
Anion gap: 7 (ref 5–15)
BUN: 19 mg/dL (ref 6–20)
CALCIUM: 8.6 mg/dL — AB (ref 8.9–10.3)
CO2: 29 mmol/L (ref 22–32)
Chloride: 101 mmol/L (ref 101–111)
Creatinine, Ser: 0.86 mg/dL (ref 0.61–1.24)
GFR calc Af Amer: >60 mL/min (ref >60)
GLUCOSE: 202 mg/dL — AB (ref 65–99)
Potassium: 3.7 mmol/L (ref 3.5–5.1)
Sodium: 137 mmol/L (ref 135–145)

## 2017-03-08 LAB — GLUCOSE, CAPILLARY
GLUCOSE-CAPILLARY: 202 mg/dL — AB (ref 65–99)
GLUCOSE-CAPILLARY: 192 mg/dL — AB (ref 65–99)
GLUCOSE-CAPILLARY: 190 mg/dL — AB (ref 65–99)
Glucose-Capillary: 185 mg/dL — ABNORMAL HIGH (ref 65–99)
Glucose-Capillary: 204 mg/dL — ABNORMAL HIGH (ref 65–99)
Glucose-Capillary: 171 mg/dL — ABNORMAL HIGH (ref 65–99)

## 2017-03-08 LAB — MAGNESIUM: Magnesium: 1.7 mg/dL (ref 1.7–2.4)

## 2017-03-08 LAB — PHOSPHORUS: Phosphorus: 3 mg/dL (ref 2.5–4.6)

## 2017-03-08 LAB — VALPROIC ACID LEVEL: Valproic Acid Lvl: 38 ug/mL — ABNORMAL LOW (ref 50.0–100.0)

## 2017-03-08 LAB — AMMONIA: AMMONIA: 34 umol/L (ref 9–35)

## 2017-03-08 MED ORDER — FAT EMULSION 20 % IV EMUL
240.0000 mL | INTRAVENOUS | Status: AC
  Administered 2017-03-08: 240 mL via INTRAVENOUS
  Filled 2017-03-08: qty 250

## 2017-03-08 MED ORDER — VALPROATE SODIUM 500 MG/5ML IV SOLN
500.0000 mg | Freq: Two times a day (BID) | INTRAVENOUS | Status: DC
  Administered 2017-03-08: 500 mg via INTRAVENOUS
  Filled 2017-03-08 (×2): qty 5

## 2017-03-08 MED ORDER — POTASSIUM CHLORIDE 10 MEQ/50ML IV SOLN
10.0000 meq | INTRAVENOUS | Status: AC
  Administered 2017-03-08 (×3): 10 meq via INTRAVENOUS
  Filled 2017-03-08 (×3): qty 50

## 2017-03-08 MED ORDER — INSULIN GLARGINE 100 UNIT/ML ~~LOC~~ SOLN
5.0000 [IU] | Freq: Every day | SUBCUTANEOUS | Status: DC
  Administered 2017-03-09 – 2017-03-14 (×6): 5 [IU] via SUBCUTANEOUS
  Filled 2017-03-08 (×6): qty 0.05

## 2017-03-08 MED ORDER — TRACE MINERALS CR-CU-MN-SE-ZN 10-1000-500-60 MCG/ML IV SOLN
INTRAVENOUS | Status: AC
  Administered 2017-03-08: 18:00:00 via INTRAVENOUS
  Filled 2017-03-08: qty 960

## 2017-03-08 MED ORDER — MAGNESIUM SULFATE 2 GM/50ML IV SOLN
2.0000 g | Freq: Once | INTRAVENOUS | Status: AC
  Administered 2017-03-08: 2 g via INTRAVENOUS
  Filled 2017-03-08: qty 50

## 2017-03-08 MED ORDER — GLUCERNA SHAKE PO LIQD
237.0000 mL | Freq: Three times a day (TID) | ORAL | Status: DC
  Administered 2017-03-08 – 2017-03-14 (×16): 237 mL via ORAL
  Filled 2017-03-08 (×20): qty 237

## 2017-03-08 NOTE — Progress Notes (Signed)
Daily Progress Note   Patient Name: Derek Crawford       Date: 03/08/2017 DOB: [birth year]  Age: 83 y.o. MRN#: [id number] Attending Physician: Lavina Hamman, MD Primary Care Physician: No primary care provider on file. Admit Date: 02/22/2017  Reason for Consultation/Follow-up: Establishing goals of care  Subjective: Mr. Bouillon is sitting in bedside chair today. He is very lethargic today.  He denies pain or discomfort. MBS 8/27 with recs for oral diet. SLP working with patient today.  TPN remains in place.      Length of Stay: 14  Current Medications: Scheduled Meds:  . aspirin  81 mg Oral Daily  . atorvastatin  10 mg Oral q1800  . chlorhexidine  15 mL Mouth Rinse BID  . Chlorhexidine Gluconate Cloth  6 each Topical Daily  . feeding supplement (GLUCERNA SHAKE)  237 mL Oral TID BM  . heparin  5,000 Units Subcutaneous Q8H  . insulin aspart  0-15 Units Subcutaneous Q4H  . insulin glargine  10 Units Subcutaneous Daily  . [START ON 03/09/2017] insulin glargine  5 Units Subcutaneous Daily  . latanoprost  1 drop Both Eyes QHS  . mouth rinse  15 mL Mouth Rinse QID  . sodium chloride flush  10-40 mL Intracatheter Q12H    Continuous Infusions: . sodium chloride 250 mL (03/07/17 1332)  . Marland KitchenTPN (CLINIMIX-E) Adult     And  . fat emulsion    . Marland KitchenTPN (CLINIMIX-E) Adult 83 mL/hr at 03/07/17 1736  . valproate sodium      PRN Meds: sodium chloride, albuterol, sodium chloride flush  Physical Exam  Constitutional: No distress.  HENT:  Head: Normocephalic and atraumatic.  Eyes: EOM are normal.  Cardiovascular:  Warm and dry  Pulmonary/Chest: Effort normal. No respiratory distress.  Neurological: He is alert.            Vital Signs: BP (!) 134/55 (BP Location: Right Arm)   Pulse 63    Temp 98.7 F (37.1 C) (Oral)   Resp 20   Ht 5\' 9"  (1.753 m)   Wt 88.7 kg (195 lb 8.8 oz)   SpO2 95%   BMI 28.88 kg/m  SpO2: SpO2: 95 % O2 Device: O2 Device: Not Delivered O2 Flow Rate: O2 Flow Rate (L/min): 4 L/min  Intake/output summary:   Intake/Output Summary (Last  24 hours) at 03/08/17 1502 Last data filed at 03/08/17 0950  Gross per 24 hour  Intake          1690.34 ml  Output             2650 ml  Net          -959.66 ml   LBM: Last BM Date: 03/07/17 Baseline Weight: Weight: 90.7 kg (200 lb) Most recent weight: Weight: 88.7 kg (195 lb 8.8 oz)       Palliative Assessment/Data: 40%      Patient Active Problem List   Diagnosis Date Noted  . Sepsis ([place])   . Dysphagia   . Encounter for palliative care   . Goals of care, counseling/discussion   . Abdominal distention   . Respiratory failure ([place]) 02/22/2017  . Ileus ([place])   . Encounter for central line placement   . Depressed mood 12/09/2013  . Allergic rhinitis 12/09/2013  . Psychosis 10/21/2013  . BPH (benign prostatic hyperplasia) 09/16/2013  . Large Tubular adenoma right colon 08/23/2013  . Unspecified constipation 08/07/2013  . Hypertension   . Hyperlipidemia   . Urinary incontinence   . Iron deficiency anemia 05/22/2013  . Dyslipidemia 05/22/2013  . Hemiplegia ([place]) 04/10/2007  . Coronary atherosclerosis 04/10/2007  . CVA 04/10/2007  . GERD 04/10/2007  . GLAUCOMA, HX OF 04/10/2007  . PERCUTANEOUS TRANSLUMINAL CORONARY ANGIOPLASTY, HX OF 04/10/2007    Palliative Care Assessment & Plan   Patient Profile:  83 year old gentleman nursing home resident prior past medical history of stroke with right-sided weakness seizures, diabetes, reflux, baseline status of debility/wheelchair bound. Patient was initially admitted to the ICU and has been hospitalized since August 15. Initially for respiratory failure from aspiration pneumonia and colonic ileus. Patient was also intubated during early part of this  hospitalization. He has been on broad-spectrum antibiotics. He has been followed by gastroenterology and general surgery. The patient underwent flexible sigmoidoscopy and has been regularly followed by speech and swallow therapy.   Assessment: Mr. Abel is sitting in bedside chair, he appears lethargic.   Recommendations/Plan:  MBS yesterday, cleared to a liquid diet. Patient re- encouraged to increase oral intake today.    Recommend calorie count when feasible.   Patient remains on TPN. Agree with plans to wean TPN.  Will reevaluate tomorrow.     Chaplain service consulted 8/27 for creation of POA papers as requested by son.   Goals of Care and Additional Recommendations:  Full code at this time.   Code Status:    Code Status Orders        Start     Ordered   02/22/17 0651  Full code  Continuous     02/22/17 0651    Code Status History    Date Active Date Inactive Code Status Order ID Comments User Context   This patient has a current code status but no historical code status.       Prognosis:   < 6 months Depending on oral intake, TPN continuation, and swallow eval and adherence to SLP recs.   Discharge Planning:  SNF rehab with palliative services on d/c.    Thank you for allowing the Palliative Medicine Team to assist in the care of this patient.   Time In: 12:45 Time Out: 13:15 Total Time 30 minutes Prolonged Time Billed No      Greater than 50%  of this time was spent counseling and coordinating care related to the above assessment and plan.  Asencion Gowda, NP  Please contact Palliative Medicine Team phone at [phone] for questions and concerns.   I saw and examined Mr. Amison in conjunction with [employer].  Please see above for details.  Micheline Rough, MD [place] Team [phone]

## 2017-03-08 NOTE — Progress Notes (Signed)
[facility] Progress Note  Patient: Derek Crawford WUJ:[id number]   PCP: No primary care provider on file. DOB: 04-Apr-1933   DOA: 02/22/2017   DOS: 03/08/2017   Date of Service: the patient was seen and examined on 03/08/2017  Subjective: Feeling better. No acute complaint. Awake but sleeps during conversation. No nausea or vomiting. No diarrhea. Passing gas.  Brief hospital course: 83 year old chronically ill male from a nursing home with CVA and right hemiparesis, seizures,  diabetes, GERD, debility/wheelchair-bound was admitted to the ICU on 8/15 due to respiratory failure from aspiration pneumonia and colonic ileus. CT on admission showed diffuse ileus, suspected cholelithiasis, patchy bilateral airspace opacities was admitted to the ICU, intubated, treated with IV cefepime, also followed by GI and general surgery, treated with NG decompression as well as a rectal tube. Completed 8 days of cefepime on 8/21 -Transferred to [facility] service  8/22, failed swallow eval-high aspiration risk 8/22 with worsening ileus s/p Flex sig on 8/23 8/24: failed swallow eval again, Palliative recommended, Gi signed off and recommended comfort care/palliative 8/26: Palliative meeting: remains Full Code, plan SNF with palliative care when tolerating PO 8/27 completed MBS, moderate oral and mild pharyngeal dysphagia-ordered liquid diet/Po intake Currently further plan is continue to wean TPN and advance diet.  Assessment and Plan: 1. Acute hypoxic respiratory failure Sepsis, aspiration pneumonia. Protein calorie malnutrition in the setting of acute illness.  -Due to sepsis/aspiration pneumonia possibly related to ileus and dysphagia -s/p VDRF -completed course of IV cefepime on 8/21 -s/p repeat SLP evaluation 8/24->NPO /high aspiration risk and palliative care meeting recommended -Remains on TNA -Pt more alert 8/26 onwards hence SLP re-assessment requested, MBS completed: remains mod aspiration risk but  started on Full liquids - We will reduce the repeat TPN rate to improve appetite. Check QTC to see if Reglan can be reintroduced. Maintain K more than 4 mag more than 2.  2. Colonic ileus - treated conservatively with NG decompression and a rectal tube -Followed by general surgery in ICU -was initially improving and NG tube out and rectal tube removed on 8/21 -then worsened again, s/p flex sig by GI which ruled out obstruction but continues to have ongoing intermittent ileus -severe dysphagia also another challenging issue at this time, per SLP eval in 2017 mild dysphagia then, but now severe and very high aspiration risk -unable to place PEG due to advance age/decreased cognition and anatomically most of stomach is in the chest, GI also recommends palliative care/comfort care, -Palliative care following Remains on TPN, we will reduce the rate of TPN and likely discontinue tomorrow night. Continue full liquid diet. Check QTC to see if Reglan can be reintroduced.  3. History of CVAs/right hemiparesis  -resume ASA/statin   4. History of seizures  -Continue Depakote by IV, change to Po when oral intake reliable  Based on my conversation with patient's RN at the nursing home this is primarily used for mood stabilizer rather than history of seizures. I will reduce the Depakote from every 8 hour to every 12 hours.  5. Metabolic encephalopathy  -secondary to #1 and 2  -also on scheduled Reglan and when necessary fentanyl in ICU which I stopped few days back -slight improvement now Reduce the rate of Depakote from every 8 hours to every 12 hours and see if that improves his mentation.  6. Diabetes mellitus ,  -lantus on hold, SSI  7. Dysphagia -multifactorial, see above -Suspect mild compensated dysphagia related to previous stroke however exacerbated significantly in the setting  of critical illness, long ICU stay and mechanical ventilation,  -s/p SLP re-eval again, high aspiration  risk, palliative consult recommended, see above   Diet: Full liquid diet DVT Prophylaxis: subcutaneous Heparin  Advance goals of care discussion: full code  Family Communication: family was present at bedside, at the time of interview. The pt provided permission to discuss medical plan with the family. Opportunity was given to ask question and all questions were answered satisfactorily.   Disposition:  Discharge to SNF.  Consultants: gastroenterology, CCS, CCM primary admission, palliative care  Antibiotics: Anti-infectives    Start     Dose/Rate Route Frequency Ordered Stop   02/25/17 2000  ceFEPIme (MAXIPIME) 1 g in dextrose 5 % 50 mL IVPB     1 g 100 mL/hr over 30 Minutes Intravenous Every 8 hours 02/25/17 1252 02/28/17 2136   02/24/17 0200  vancomycin (VANCOCIN) IVPB 1000 mg/200 mL premix  Status:  Discontinued     1,000 mg 200 mL/hr over 60 Minutes Intravenous Every 48 hours 02/22/17 1051 02/23/17 0848   02/22/17 1200  vancomycin (VANCOCIN) 50 mg/mL oral solution 500 mg  Status:  Discontinued     500 mg Oral Every 6 hours 02/22/17 1018 02/22/17 1019   02/22/17 1200  vancomycin (VANCOCIN) 50 mg/mL oral solution 500 mg  Status:  Discontinued     500 mg Per Tube Every 6 hours 02/22/17 1019 02/22/17 2227   02/22/17 1200  ceFEPIme (MAXIPIME) 2 g in dextrose 5 % 50 mL IVPB  Status:  Discontinued     2 g 100 mL/hr over 30 Minutes Intravenous Every 24 hours 02/22/17 1052 02/25/17 1253   02/22/17 1030  metroNIDAZOLE (FLAGYL) IVPB 500 mg  Status:  Discontinued     500 mg 100 mL/hr over 60 Minutes Intravenous Every 8 hours 02/22/17 1014 02/23/17 0848   02/22/17 0300  vancomycin (VANCOCIN) IVPB 1000 mg/200 mL premix     1,000 mg 200 mL/hr over 60 Minutes Intravenous  Once 02/22/17 0257 02/22/17 0412   02/22/17 0300  piperacillin-tazobactam (ZOSYN) IVPB 3.375 g     3.375 g 100 mL/hr over 30 Minutes Intravenous  Once 02/22/17 0257 02/22/17 0345       Objective: Physical  Exam: Vitals:   03/07/17 2027 03/08/17 0608 03/08/17 0613 03/08/17 1337  BP: (!) 113/34 (!) 160/62  (!) 134/55  Pulse: 64 65  63  Resp: 20 20  20   Temp: 98.3 F (36.8 C) 98 F (36.7 C)  98.7 F (37.1 C)  TempSrc: Oral Oral  Oral  SpO2: 97% 100%  95%  Weight:   88.7 kg (195 lb 8.8 oz)   Height:        Intake/Output Summary (Last 24 hours) at 03/08/17 1620 Last data filed at 03/08/17 0950  Gross per 24 hour  Intake          1690.34 ml  Output             2650 ml  Net          -959.66 ml   Filed Weights   03/05/17 0526 03/07/17 0429 03/08/17 4268  Weight: 90.7 kg (199 lb 15.3 oz) 89.3 kg (196 lb 13.9 oz) 88.7 kg (195 lb 8.8 oz)   General: Alert, Awake and Oriented to Time, Place and Person. Appear in mild distress, affect appropriate Eyes: PERRL, Conjunctiva normal ENT: Oral Mucosa clear moist. Neck: difficult to assess JVD, no Abnormal Mass Or lumps Cardiovascular: S1 and S2 Present, no Murmur, Peripheral Pulses  Present Respiratory: normal respiratory effort, Bilateral Air entry equal and Decreased, no use of accessory muscle, basal Crackles, no wheezes Abdomen: Bowel Sound present, Soft and no tenderness, no hernia Skin: no redness, no Rash, no induration Extremities: trace Pedal edema, no calf tenderness Neurologic: Grossly no focal neuro deficit. Bilaterally Equal motor strength  Data Reviewed: CBC:  Recent Labs Lab 03/03/17 0810 03/04/17 0424 03/05/17 0341 03/05/17 2142 03/06/17 0342  WBC 6.5 7.1 6.9 6.4 6.7  NEUTROABS  --   --   --   --  4.1  HGB 10.4* 10.2* 10.3* 10.4* 10.6*  HCT 32.3* 31.6* 31.8* 32.5* 32.5*  MCV 80.8 82.5 82.4 82.3 81.5  PLT 135* 144* 144* 185 347   Basic Metabolic Panel:  Recent Labs Lab 03/04/17 0424 03/05/17 0341 03/05/17 2142 03/06/17 0342 03/07/17 0545 03/08/17 0502  NA 137 141 140 138 139 137  K 3.6 3.7 3.7 3.5 3.4* 3.7  CL 101 103 103 102 102 101  CO2 31 33* 32 32 32 29  GLUCOSE 169* 153* 148* 154* 166* 202*  BUN 15  17 18 19  21* 19  CREATININE 0.82 0.76 0.75 0.76 0.91 0.86  CALCIUM 8.1* 8.3* 8.6* 8.5* 8.3* 8.6*  MG 1.8 1.8  --  1.8 1.7 1.7  PHOS 2.4* 3.0  --  3.1 3.1 3.0    Liver Function Tests:  Recent Labs Lab 03/02/17 0415 03/05/17 2142 03/06/17 0342  AST 25 16 15   ALT 20 13* 12*  ALKPHOS 39 37* 33*  BILITOT 0.2* 0.3 0.1*  PROT 5.6* 6.3* 6.5  ALBUMIN 2.3* 2.4* 2.4*   No results for input(s): LIPASE, AMYLASE in the last 168 hours.  Recent Labs Lab 03/08/17 1220  AMMONIA 34   Coagulation Profile: No results for input(s): INR, PROTIME in the last 168 hours. Cardiac Enzymes:  Recent Labs Lab 03/05/17 2142  TROPONINI <0.03   BNP (last 3 results) No results for input(s): PROBNP in the last 8760 hours. CBG:  Recent Labs Lab 03/08/17 0225 03/08/17 0612 03/08/17 0733 03/08/17 1223 03/08/17 1612  GLUCAP 185* 202* 204* 192* 190*   Studies: No results found.  Scheduled Meds: . aspirin  81 mg Oral Daily  . atorvastatin  10 mg Oral q1800  . chlorhexidine  15 mL Mouth Rinse BID  . Chlorhexidine Gluconate Cloth  6 each Topical Daily  . feeding supplement (GLUCERNA SHAKE)  237 mL Oral TID BM  . heparin  5,000 Units Subcutaneous Q8H  . insulin aspart  0-15 Units Subcutaneous Q4H  . insulin glargine  10 Units Subcutaneous Daily  . [START ON 03/09/2017] insulin glargine  5 Units Subcutaneous Daily  . latanoprost  1 drop Both Eyes QHS  . mouth rinse  15 mL Mouth Rinse QID  . sodium chloride flush  10-40 mL Intracatheter Q12H   Continuous Infusions: . sodium chloride 250 mL (03/07/17 1332)  . Marland KitchenTPN (CLINIMIX-E) Adult     And  . fat emulsion    . Marland KitchenTPN (CLINIMIX-E) Adult 83 mL/hr at 03/07/17 1736  . valproate sodium     PRN Meds: sodium chloride, albuterol, sodium chloride flush  Time spent: 40 minutes  Author: Berle Mull, MD [facility] Pager: [phone] 03/08/2017 4:20 PM  If 7PM-7AM, please contact night-coverage at [email or web address], password [facility]

## 2017-03-08 NOTE — Progress Notes (Signed)
  Speech Language Pathology Treatment: Dysphagia  Patient Details Name: Derek Crawford MRN: [id number] DOB: 09-25-1932 Today's Date: 03/08/2017 Time: 1450-1500 SLP Time Calculation (min) (ACUTE ONLY): 10 min  Assessment / Plan / Recommendation Clinical Impression  Brief observation of pt consuming one sip of Glucerna via straw with RN; Palliative care RN present as well. No difficulties with single sip. RN reported po intake continues to be small (several bites/sips). Encouraged pt to try ice cream/more liquids; refused and becoming frustrated with SLP. Liquids/puree were manipulated easier than solids during MBS therefore full liquids was recommended. If pt desires something other than this texture and is able to manipulate/masticate he should be allowed to attempt. ST will sign off at present. Please contact ST if questions arise. Continue thorough oral care.   HPI HPI: 83 yo M NH resident presented with ileus and difficulty of breathing. CT abdomen confirmed ileus and blood work showed acute renal failure with signs of sepsis.  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  Pt had NG placed but it is now removed and he was placed on clears.  Pt was coughing with all intake yesterday per RN.  MBS conducted 2017 as an OP showed mild difficulties with no aspiration/penetration.        SLP Plan  All goals met;Discharge SLP treatment due to (comment)       Recommendations  Diet recommendations: Thin liquid (full liquids) Liquids provided via: Cup;Straw Medication Administration: Crushed with puree Supervision: Full supervision/cueing for compensatory strategies;Staff to assist with self feeding Compensations: Slow rate;Small sips/bites                Oral Care Recommendations: Oral care QID Follow up Recommendations: Skilled Nursing facility SLP Visit Diagnosis: Dysphagia, oropharyngeal phase (R13.12) Plan: All goals met;Discharge SLP treatment due to  (comment)       GO                Houston Siren 03/08/2017, 3:22 PM  Orbie Pyo Colvin Caroli.[employer] [phone]

## 2017-03-08 NOTE — Progress Notes (Signed)
[place] NOTE   Pharmacy Consult for TPN Indication: prolonged ileus  Patient Measurements: Height: 5\' 9"  (175.3 cm) Weight: 195 lb 8.8 oz (88.7 kg) IBW/kg (Calculated) : 70.7  AdjBW: 76kg  Body mass index is 28.88 kg/m.   HPI: 83 yo M admitted from NH on 02/22/17 with acute respiratory failure due from aspiration pneumonia and colonic ileus.  Intubated 8/15-20, ileus treated with NG decompression.  He is not a candidate for tube feedings due to hiatal hernia/risk of aspiration.   Pharmacy consulted to start TPN while patient is NPO.   Significant events:  8/20 Extubated and NGT removed.  MD to consider trickle feeds, but no NGT/OGT in place at this time. 8/21 Changed from ICU to telemetry status, will resume lipids in TPN dosing.  Xray shows persistent SB ileus. 8/23 Flex sigmoidoscopy showed no distal obstructing lesions 8/29 Spoke today to Dr. Posey Pronto. Current plan is to progress diet. Therefore, plan on TPN for one more day at half rate and then stop at 1800 on 8/30   Insulin requirements past 24 hours: 21 units Novolog and Lantus 10 units daily started 8/25  Current Nutrition: NPO, evaluated by SLP 8/22 as severe aspiration risk 8/27 Progress to full liquids.  8/29 Per MD plan on progressing diet   IVF: none  Central access:  CVC triple lumen placed 8/15 TPN start date: 8/18  ASSESSMENT                                                                                                          Today. 03/08/2017:   Glucose:  CBGs slightly above goal.  Hx DM on Lantus 25 units/day PTA  Electrolytes:  K at low end of normal at 3.4,  Mag on very low end of normal, CorrCa WaNL,  all other WNL  Renal:  WNL, stable  LFTs:  WNL (8/20), ( 8/27)   TGs:  122 (8/20), 129 (8/27)  Prealbumin:  9.6 (8/20), 18 (8/27)   NUTRITIONAL GOALS                                                                                             RD recs (8/20):   90-100 Protein, 1815-2085 Kcal Clinimix 5/15 at a goal rate of 83 ml/hr + 20% fat emulsion 240 ml/day to provide: 100 g of protein and 1894 kCals per day meeting 100% of protein and 100% of kCal needs   PLAN       KCl 8meq IV x 3 Mag sulfate 2gm IV x 1  At 1800 today:  As per discussion with MD, decrease Clinimix E 5/15 to 40 ml/hr  with new bag   Continue 20% fat emulsion to 62ml/hr over 12 hr.   TPN to contain standard multivitamins and trace elements daily.  Continue CBGs and moderate SSI q4h for now and decrease Lantus to 5 units SQ daily   TPN lab panels on Mondays & Thursdays.    Royetta Asal, PharmD, BCPS Pager [phone] 03/08/2017 12:42 PM

## 2017-03-09 LAB — COMPREHENSIVE METABOLIC PANEL
ALT: 13 U/L — AB (ref 17–63)
AST: 18 U/L (ref 15–41)
Albumin: 2.6 g/dL — ABNORMAL LOW (ref 3.5–5.0)
Alkaline Phosphatase: 46 U/L (ref 38–126)
Anion gap: 7 (ref 5–15)
BILIRUBIN TOTAL: 0.3 mg/dL (ref 0.3–1.2)
BUN: 15 mg/dL (ref 6–20)
CALCIUM: 8.6 mg/dL — AB (ref 8.9–10.3)
CO2: 27 mmol/L (ref 22–32)
CREATININE: 0.83 mg/dL (ref 0.61–1.24)
Chloride: 103 mmol/L (ref 101–111)
GFR calc Af Amer: >60 mL/min (ref >60)
Glucose, Bld: 176 mg/dL — ABNORMAL HIGH (ref 65–99)
Potassium: 3.8 mmol/L (ref 3.5–5.1)
Sodium: 137 mmol/L (ref 135–145)
TOTAL PROTEIN: 6.8 g/dL (ref 6.5–8.1)

## 2017-03-09 LAB — CBC WITH DIFFERENTIAL/PLATELET
BASOS PCT: 0 %
Basophils Absolute: 0 10*3/uL (ref 0.0–0.1)
EOS ABS: 0.4 10*3/uL (ref 0.0–0.7)
Eosinophils Relative: 4 %
HCT: 32.6 % — ABNORMAL LOW (ref 39.0–52.0)
Hemoglobin: 10.6 g/dL — ABNORMAL LOW (ref 13.0–17.0)
Lymphocytes Relative: 20 %
Lymphs Abs: 1.8 10*3/uL (ref 0.7–4.0)
MCH: 26.3 pg (ref 26.0–34.0)
MCHC: 32.5 g/dL (ref 30.0–36.0)
MCV: 80.9 fL (ref 78.0–100.0)
MONO ABS: 1.1 10*3/uL — AB (ref 0.1–1.0)
MONOS PCT: 12 %
Neutro Abs: 5.7 10*3/uL (ref 1.7–7.7)
Neutrophils Relative %: 64 %
Platelets: 233 10*3/uL (ref 150–400)
RBC: 4.03 MIL/uL — ABNORMAL LOW (ref 4.22–5.81)
RDW: 14.2 % (ref 11.5–15.5)
WBC: 9 10*3/uL (ref 4.0–10.5)

## 2017-03-09 LAB — GLUCOSE, CAPILLARY
GLUCOSE-CAPILLARY: 142 mg/dL — AB (ref 65–99)
GLUCOSE-CAPILLARY: 151 mg/dL — AB (ref 65–99)
Glucose-Capillary: 170 mg/dL — ABNORMAL HIGH (ref 65–99)
Glucose-Capillary: 216 mg/dL — ABNORMAL HIGH (ref 65–99)
Glucose-Capillary: 130 mg/dL — ABNORMAL HIGH (ref 65–99)
Glucose-Capillary: 98 mg/dL (ref 65–99)

## 2017-03-09 LAB — MAGNESIUM: MAGNESIUM: 1.8 mg/dL (ref 1.7–2.4)

## 2017-03-09 LAB — PHOSPHORUS: Phosphorus: 3.4 mg/dL (ref 2.5–4.6)

## 2017-03-09 MED ORDER — DIVALPROEX SODIUM 250 MG PO DR TAB
500.0000 mg | DELAYED_RELEASE_TABLET | Freq: Two times a day (BID) | ORAL | Status: DC
Start: 2017-03-09 — End: 2017-03-10
  Administered 2017-03-09 – 2017-03-10 (×3): 500 mg via ORAL
  Filled 2017-03-09 (×3): qty 2

## 2017-03-09 MED ORDER — VALPROIC ACID 250 MG PO CAPS: 500.0000 mg | ORAL_CAPSULE | Freq: Two times a day (BID) | ORAL | Status: DC

## 2017-03-09 MED ORDER — METOCLOPRAMIDE HCL 5 MG/ML IJ SOLN
10.0000 mg | Freq: Three times a day (TID) | INTRAMUSCULAR | Status: DC
  Administered 2017-03-09 – 2017-03-12 (×8): 10 mg via INTRAVENOUS
  Filled 2017-03-09 (×8): qty 2

## 2017-03-09 MED ORDER — ACETAMINOPHEN 325 MG PO TABS
650.0000 mg | ORAL_TABLET | Freq: Four times a day (QID) | ORAL | Status: DC | PRN
  Administered 2017-03-09 – 2017-03-12 (×2): 650 mg via ORAL
  Filled 2017-03-09 (×2): qty 2

## 2017-03-09 MED ORDER — CALCIUM CARBONATE ANTACID 500 MG PO CHEW: 400.0000 mg | CHEWABLE_TABLET | Freq: Once | ORAL | Status: DC

## 2017-03-09 MED ORDER — ENOXAPARIN SODIUM 40 MG/0.4ML ~~LOC~~ SOLN
40.0000 mg | SUBCUTANEOUS | Status: DC
  Administered 2017-03-09 – 2017-03-13 (×5): 40 mg via SUBCUTANEOUS
  Filled 2017-03-09 (×5): qty 0.4

## 2017-03-09 NOTE — Progress Notes (Signed)
Chaplain consult for Advance Directive.    Pt asleep upon arrival.  Awoke and engaged with chaplain.  Voiced understanding of health care power of attorney and stated that he did want to do this.  However, was not able to decide who he might want to serve in this role, replying "I don't know" as chaplain framed question in several ways.  Derek Crawford stated he would like to think about this.   He is able to demonstrate competency and understanding at this point.  Chaplain will follow up to complete document when pt is ready to make decision about HCPOA.    [facility] / [place], MDiv

## 2017-03-09 NOTE — Progress Notes (Signed)
Calorie Count Note  MD placed consult for calore count. 48 hour calorie count ordered (8/30-8/31).  Please hang calorie count envelope on the patient's door. Document percent consumed for each item on the patient's meal tray ticket and keep in envelope. Also document percent of any supplement or snack pt consumes and keep documentation in envelope for RD to review.   Day 1 results to follow.  Clayton Bibles, MS, RD, LDN Pager: [phone] After Hours Pager: [phone]

## 2017-03-09 NOTE — Progress Notes (Signed)
Daily Progress Note   Patient Name: Derek Crawford       Date: 03/09/2017 DOB: Sep 17, 1932  Age: 83 y.o. MRN#: [id number] Attending Physician: Lavina Hamman, MD Primary Care Physician: No primary care provider on file. Admit Date: 02/22/2017  Reason for Consultation/Follow-up: Establishing goals of care  Subjective: [name] is resting in bed this moring. He awakens briefly to answer questions and then goes back to sleep.  He denies pain or discomfort.  TPN remains in place. He does have a full liquid diet ordered though he has very poor oral intake.   Later spoke with son Marden Noble who is at bedside. Patient is alert and answering questions. During visit Marden Noble was attempting to feed him in bed. Coughing noted. Upon repositioning to sit straight up, coughing ceased. He was noted to cough with grits which Marden Noble does not want [name] to receive again. He would also like ensure Vanilla supplement added.    Marden Noble wants to think about code status and will readdress with MOST form prior to discharge.      Length of Stay: 15  Current Medications: Scheduled Meds:  . aspirin  81 mg Oral Daily  . atorvastatin  10 mg Oral q1800  . calcium carbonate  400 mg of elemental calcium Oral Once  . chlorhexidine  15 mL Mouth Rinse BID  . Chlorhexidine Gluconate Cloth  6 each Topical Daily  . enoxaparin (LOVENOX) injection  40 mg Subcutaneous Q24H  . feeding supplement (GLUCERNA SHAKE)  237 mL Oral TID BM  . insulin aspart  0-15 Units Subcutaneous Q4H  . insulin glargine  5 Units Subcutaneous Daily  . latanoprost  1 drop Both Eyes QHS  . mouth rinse  15 mL Mouth Rinse QID  . sodium chloride flush  10-40 mL Intracatheter Q12H  . valproic acid  500 mg Oral BID    Continuous Infusions: . sodium chloride 250  mL (03/07/17 1332)  . Marland KitchenTPN (CLINIMIX-E) Adult 40 mL/hr at 03/08/17 1756    PRN Meds: sodium chloride, albuterol, sodium chloride flush  Physical Exam  Constitutional: No distress.  Resting with eyes closed, easily arousable. Later alert and looking around room.   HENT:  Head: Normocephalic and atraumatic.  Eyes: EOM are normal.  Cardiovascular:  Warm and dry  Pulmonary/Chest: Effort normal. No respiratory distress.  Abdominal: He exhibits distension.  Neurological: He is alert.            Vital Signs: BP (!) 134/52 (BP Location: Right Arm)   Pulse 64   Temp 98.1 F (36.7 C) (Oral)   Resp 20   Ht 5\' 9"  (1.753 m)   Wt 88.4 kg (194 lb 14.2 oz)   SpO2 97%   BMI 28.78 kg/m  SpO2: SpO2: 97 % O2 Device: O2 Device: Not Delivered O2 Flow Rate: O2 Flow Rate (L/min): 4 L/min  Intake/output summary:   Intake/Output Summary (Last 24 hours) at 03/09/17 0934 Last data filed at 03/09/17 0503  Gross per 24 hour  Intake          1859.97 ml  Output             2400 ml  Net          -540.03 ml   LBM: Last BM Date: 03/08/17 Baseline Weight: Weight: 90.7 kg (200 lb) Most recent weight: Weight: 88.4 kg (194 lb 14.2 oz)       Palliative Assessment/Data: 40%      Patient Active Problem List   Diagnosis Date Noted  . Sepsis ([place])   . Dysphagia   . Encounter for palliative care   . Goals of care, counseling/discussion   . Abdominal distention   . Respiratory failure ([place]) 02/22/2017  . Ileus ([place])   . Encounter for central line placement   . Depressed mood 12/09/2013  . Allergic rhinitis 12/09/2013  . Psychosis 10/21/2013  . BPH (benign prostatic hyperplasia) 09/16/2013  . Large Tubular adenoma right colon 08/23/2013  . Unspecified constipation 08/07/2013  . Hypertension   . Hyperlipidemia   . Urinary incontinence   . Iron deficiency anemia 05/22/2013  . Dyslipidemia 05/22/2013  . Hemiplegia ([place]) 04/10/2007  . Coronary atherosclerosis 04/10/2007  . CVA 04/10/2007    . GERD 04/10/2007  . GLAUCOMA, HX OF 04/10/2007  . PERCUTANEOUS TRANSLUMINAL CORONARY ANGIOPLASTY, HX OF 04/10/2007    Palliative Care Assessment & Plan   Patient Profile:  83 year old gentleman nursing home resident prior past medical history of stroke with right-sided weakness seizures, diabetes, reflux, baseline status of debility/wheelchair bound. Patient was initially admitted to the ICU and has been hospitalized since August 15. Initially for respiratory failure from aspiration pneumonia and colonic ileus. Patient was also intubated during early part of this hospitalization. He has been on broad-spectrum antibiotics. He has been followed by gastroenterology and general surgery. The patient underwent flexible sigmoidoscopy and has been regularly followed by speech and swallow therapy.   Assessment: [name] is resting in bed, he briefly awakens to answer questions and goes back to sleep.   Recommendations/Plan:  MBS 8/27, cleared to a full liquid diet. Patient again encouraged to increase oral intake today, he ate when fed by son.    Recommend calorie count when feasible.   Patient remains on TPN. Agree with plans to wean TPN.  Recommend continuing to wean Depakote for improved alertness and hopeful increased oral itake. Marden Noble would like him weaned off Depakote.    Will reevaluate tomorrow.     8/27: Chaplain service consulted for creation of POA papers as requested by son.   Goals of Care and Additional Recommendations:  Full code at this time.   Code Status:    Code Status Orders        Start     Ordered   02/22/17 0651  Full code  Continuous  02/22/17 0651    Code Status History    Date Active Date Inactive Code Status Order ID Comments User Context   This patient has a current code status but no historical code status.       Prognosis:   < 6 months Depending on oral intake, TPN continuation, and swallow eval and adherence to SLP recs.   Discharge  Planning:  SNF rehab with palliative services on d/c.    Thank you for allowing the Palliative Medicine Team to assist in the care of this patient.   Time In: 09:15 Time Out: 10:15 Total Time 60 minutes Prolonged Time Billed No      Greater than 50%  of this time was spent counseling and coordinating care related to the above assessment and plan.  Asencion Gowda, NP  Please contact Palliative Medicine Team phone at [phone] for questions and concerns.   I saw and examined Mr. Releford in conjunction with [employer].  Please see above for details.  Micheline Rough, MD [place] Team [phone]

## 2017-03-09 NOTE — Progress Notes (Signed)
Physical Therapy Discharge Patient Details Name: Derek Crawford MRN: [id number] DOB: 12-31-1932 Today's Date: 03/09/2017 Time:  -     Patient discharged from PT services secondary to patient is a long  term resident of SNF, requires mechanical lift for OOB activities. Skilled PT intervention at this time is not indicated. Please see latest therapy progress note for current level of functioning and progress toward goals.     GP     Marcelino Freestone PT [phone]  03/09/2017, 9:05 AM

## 2017-03-09 NOTE — Progress Notes (Signed)
Offered patient bites from dinner.  Patient took one bite each of vanilla pudding and chocolate pudding.  Refused soup.  Refused the rest of his tray and stated he did not want any of it.  Will continue to encourage PO intake and monitor patient.

## 2017-03-09 NOTE — Progress Notes (Signed)
[facility] Progress Note  Patient: Derek Crawford VPX:[id number]   PCP: No primary care provider on file. DOB: 08-23-1932   DOA: 02/22/2017   DOS: 03/09/2017   Date of Service: the patient was seen and examined on 03/09/2017  Subjective: Feeling better. No acute complaint. Awake but sleeps during conversation. No nausea or vomiting. No diarrhea. Passing gas.  Brief hospital course: 83 year old chronically ill male from a nursing home with CVA and right hemiparesis, seizures,  diabetes, GERD, debility/wheelchair-bound was admitted to the ICU on 8/15 due to respiratory failure from aspiration pneumonia and colonic ileus. CT on admission showed diffuse ileus, suspected cholelithiasis, patchy bilateral airspace opacities was admitted to the ICU, intubated, treated with IV cefepime, also followed by GI and general surgery, treated with NG decompression as well as a rectal tube. Completed 8 days of cefepime on 8/21 -Transferred to [facility] service  8/22, failed swallow eval-high aspiration risk 8/22 with worsening ileus s/p Flex sig on 8/23 8/24: failed swallow eval again, Palliative recommended, Gi signed off and recommended comfort care/palliative 8/26: Palliative meeting: remains Full Code, plan SNF with palliative care when tolerating PO 8/27 completed MBS, moderate oral and mild pharyngeal dysphagia-ordered liquid diet/Po intake Currently further plan is continue to wean TPN and advance diet.  Assessment and Plan: 1. Acute hypoxic respiratory failure Sepsis, aspiration pneumonia. Protein calorie malnutrition in the setting of acute illness.  -Due to sepsis/aspiration pneumonia possibly related to ileus and dysphagia -s/p VDRF -completed course of IV cefepime on 8/21 -s/p repeat SLP evaluation 8/24->NPO /high aspiration risk and palliative care meeting recommended -Remains on TNA -Pt more alert 8/26 onwards hence SLP re-assessment requested, MBS completed: remains mod aspiration risk but  started on Full liquids - Stop TPN. Continue full liquid. Initiate calorie count.  2. Colonic ileus - treated conservatively with NG decompression and a rectal tube -Followed by general surgery in ICU -was initially improving and NG tube out and rectal tube removed on 8/21 -then worsened again, s/p flex sig by GI which ruled out obstruction but continues to have ongoing intermittent ileus -severe dysphagia also another challenging issue at this time, per SLP eval in 2017 mild dysphagia then, but now severe and very high aspiration risk -unable to place PEG due to advance age/decreased cognition and anatomically most of stomach is in the chest, GI also recommends palliative care/comfort care, -Palliative care following Remains on TPN, we will reduce the rate of TPN and likely discontinue tomorrow night. Continue full liquid diet. QTC is 380. Starting back Reglan.  3. History of CVAs/right hemiparesis  -resume ASA/statin   4. History of seizures  -Continue Depakote by IV, change to Po when oral intake reliable  Based on my conversation with patient's RN at the nursing home this is primarily used for mood stabilizer rather than history of seizures. I will reduce the Depakote from every 8 hour to every 12 hours.  5. Metabolic encephalopathy  -secondary to #1 and 2  -also on scheduled Reglan and when necessary fentanyl in ICU which I stopped few days back -slight improvement now Reduce the rate of Depakote from every 8 hours to every 12 hours and see if that improves his mentation.  6. Diabetes mellitus ,  -lantus on hold, SSI  7. Dysphagia -multifactorial, see above -Suspect mild compensated dysphagia related to previous stroke however exacerbated significantly in the setting of critical illness, long ICU stay and mechanical ventilation,  -s/p SLP re-eval again, high aspiration risk, palliative consult recommended, see above  Diet: Full liquid diet DVT Prophylaxis: subcutaneous  Heparin  Advance goals of care discussion: full code  Family Communication: no family was present at bedside, at the time of interview.   Disposition:  Discharge to SNF.  Consultants: gastroenterology, CCS, CCM primary admission, palliative care  Antibiotics: Anti-infectives    Start     Dose/Rate Route Frequency Ordered Stop   02/25/17 2000  ceFEPIme (MAXIPIME) 1 g in dextrose 5 % 50 mL IVPB     1 g 100 mL/hr over 30 Minutes Intravenous Every 8 hours 02/25/17 1252 02/28/17 2136   02/24/17 0200  vancomycin (VANCOCIN) IVPB 1000 mg/200 mL premix  Status:  Discontinued     1,000 mg 200 mL/hr over 60 Minutes Intravenous Every 48 hours 02/22/17 1051 02/23/17 0848   02/22/17 1200  vancomycin (VANCOCIN) 50 mg/mL oral solution 500 mg  Status:  Discontinued     500 mg Oral Every 6 hours 02/22/17 1018 02/22/17 1019   02/22/17 1200  vancomycin (VANCOCIN) 50 mg/mL oral solution 500 mg  Status:  Discontinued     500 mg Per Tube Every 6 hours 02/22/17 1019 02/22/17 2227   02/22/17 1200  ceFEPIme (MAXIPIME) 2 g in dextrose 5 % 50 mL IVPB  Status:  Discontinued     2 g 100 mL/hr over 30 Minutes Intravenous Every 24 hours 02/22/17 1052 02/25/17 1253   02/22/17 1030  metroNIDAZOLE (FLAGYL) IVPB 500 mg  Status:  Discontinued     500 mg 100 mL/hr over 60 Minutes Intravenous Every 8 hours 02/22/17 1014 02/23/17 0848   02/22/17 0300  vancomycin (VANCOCIN) IVPB 1000 mg/200 mL premix     1,000 mg 200 mL/hr over 60 Minutes Intravenous  Once 02/22/17 0257 02/22/17 0412   02/22/17 0300  piperacillin-tazobactam (ZOSYN) IVPB 3.375 g     3.375 g 100 mL/hr over 30 Minutes Intravenous  Once 02/22/17 0257 02/22/17 0345     Objective: Physical Exam: Vitals:   03/08/17 1337 03/08/17 2215 03/09/17 0446 03/09/17 1415  BP: (!) 134/55 124/64 (!) 134/52 (!) 143/70  Pulse: 63 66 64 64  Resp: 20 20 20 18   Temp: 98.7 F (37.1 C) 98.2 F (36.8 C) 98.1 F (36.7 C) 97.7 F (36.5 C)  TempSrc: Oral Oral Oral Oral    SpO2: 95% 97% 97% 97%  Weight:   88.4 kg (194 lb 14.2 oz)   Height:        Intake/Output Summary (Last 24 hours) at 03/09/17 1636 Last data filed at 03/09/17 1421  Gross per 24 hour  Intake          1115.97 ml  Output             2600 ml  Net         -1484.03 ml   Filed Weights   03/07/17 0429 03/08/17 0613 03/09/17 0446  Weight: 89.3 kg (196 lb 13.9 oz) 88.7 kg (195 lb 8.8 oz) 88.4 kg (194 lb 14.2 oz)   General: Alert, Awake and Oriented to Time, Place and Person. Appear in mild distress, affect appropriate Eyes: PERRL, Conjunctiva normal ENT: Oral Mucosa clear moist. Neck: difficult to assess JVD, no Abnormal Mass Or lumps Cardiovascular: S1 and S2 Present, no Murmur, Peripheral Pulses Present Respiratory: normal respiratory effort, Bilateral Air entry equal and Decreased, no use of accessory muscle, basal Crackles, no wheezes Abdomen: Bowel Sound present, Soft and no tenderness, no hernia Skin: no redness, no Rash, no induration Extremities: trace Pedal edema, no calf tenderness Neurologic:  Grossly no focal neuro deficit. Bilaterally Equal motor strength  Data Reviewed: CBC:  Recent Labs Lab 03/04/17 0424 03/05/17 0341 03/05/17 2142 03/06/17 0342 03/09/17 0458  WBC 7.1 6.9 6.4 6.7 9.0  NEUTROABS  --   --   --  4.1 5.7  HGB 10.2* 10.3* 10.4* 10.6* 10.6*  HCT 31.6* 31.8* 32.5* 32.5* 32.6*  MCV 82.5 82.4 82.3 81.5 80.9  PLT 144* 144* 185 185 546   Basic Metabolic Panel:  Recent Labs Lab 03/05/17 0341 03/05/17 2142 03/06/17 0342 03/07/17 0545 03/08/17 0502 03/09/17 0458  NA 141 140 138 139 137 137  K 3.7 3.7 3.5 3.4* 3.7 3.8  CL 103 103 102 102 101 103  CO2 33* 32 32 32 29 27  GLUCOSE 153* 148* 154* 166* 202* 176*  BUN 17 18 19  21* 19 15  CREATININE 0.76 0.75 0.76 0.91 0.86 0.83  CALCIUM 8.3* 8.6* 8.5* 8.3* 8.6* 8.6*  MG 1.8  --  1.8 1.7 1.7 1.8  PHOS 3.0  --  3.1 3.1 3.0 3.4   Liver Function Tests:  Recent Labs Lab 03/05/17 2142 03/06/17 0342  03/09/17 0458  AST 16 15 18   ALT 13* 12* 13*  ALKPHOS 37* 33* 46  BILITOT 0.3 0.1* 0.3  PROT 6.3* 6.5 6.8  ALBUMIN 2.4* 2.4* 2.6*   No results for input(s): LIPASE, AMYLASE in the last 168 hours.  Recent Labs Lab 03/08/17 1220  AMMONIA 34   Coagulation Profile: No results for input(s): INR, PROTIME in the last 168 hours. Cardiac Enzymes:  Recent Labs Lab 03/05/17 2142  TROPONINI <0.03   BNP (last 3 results) No results for input(s): PROBNP in the last 8760 hours. CBG:  Recent Labs Lab 03/09/17 0012 03/09/17 0406 03/09/17 0806 03/09/17 1231 03/09/17 1612  GLUCAP 151* 142* 170* 216* 130*   Studies: No results found.  Scheduled Meds: . aspirin  81 mg Oral Daily  . atorvastatin  10 mg Oral q1800  . calcium carbonate  400 mg of elemental calcium Oral Once  . chlorhexidine  15 mL Mouth Rinse BID  . Chlorhexidine Gluconate Cloth  6 each Topical Daily  . divalproex  500 mg Oral Q12H  . enoxaparin (LOVENOX) injection  40 mg Subcutaneous Q24H  . feeding supplement (GLUCERNA SHAKE)  237 mL Oral TID BM  . insulin aspart  0-15 Units Subcutaneous Q4H  . insulin glargine  5 Units Subcutaneous Daily  . latanoprost  1 drop Both Eyes QHS  . mouth rinse  15 mL Mouth Rinse QID  . sodium chloride flush  10-40 mL Intracatheter Q12H   Continuous Infusions: . sodium chloride 250 mL (03/07/17 1332)  . Marland KitchenTPN (CLINIMIX-E) Adult 40 mL/hr at 03/08/17 1756   PRN Meds: sodium chloride, albuterol, sodium chloride flush  Time spent: 40 minutes  Author: Berle Mull, MD [facility] Pager: [phone] 03/09/2017 4:36 PM  If 7PM-7AM, please contact night-coverage at [email or web address], password [facility]

## 2017-03-09 NOTE — Care Management Important Message (Signed)
Important Message  Patient Details  Name: Derek Crawford MRN: [id number] Date of Birth: 11-29-1932   Medicare Important Message Given:  Yes    Kerin Salen 03/09/2017, 10:13 AMImportant Message  Patient Details  Name: Derek Crawford MRN: [id number] Date of Birth: 01/04/1933   Medicare Important Message Given:  Yes    Kerin Salen 03/09/2017, 10:13 AM

## 2017-03-09 NOTE — Plan of Care (Signed)
Problem: Nutrition: Goal: Adequate nutrition will be maintained Outcome: Not Progressing Pt continues to refuse to eat or drink when offered. Took one sip of his Glucerna. Will continue to encourage/offer fluids/food.

## 2017-03-09 NOTE — Progress Notes (Signed)
Patient is refusing to eat thus far today.  He has had a cup of orange juice and a few bites of applesauce with his pills.  Will document in calorie count.  Will continue to encourage PO intake and continue to monitor.

## 2017-03-10 LAB — GLUCOSE, CAPILLARY
Glucose-Capillary: 90 mg/dL (ref 65–99)
Glucose-Capillary: 102 mg/dL — ABNORMAL HIGH (ref 65–99)
Glucose-Capillary: 118 mg/dL — ABNORMAL HIGH (ref 65–99)
Glucose-Capillary: 144 mg/dL — ABNORMAL HIGH (ref 65–99)
Glucose-Capillary: 109 mg/dL — ABNORMAL HIGH (ref 65–99)
Glucose-Capillary: 96 mg/dL (ref 65–99)

## 2017-03-10 LAB — BASIC METABOLIC PANEL
ANION GAP: 6 (ref 5–15)
BUN: 17 mg/dL (ref 6–20)
CALCIUM: 8.5 mg/dL — AB (ref 8.9–10.3)
CO2: 28 mmol/L (ref 22–32)
CREATININE: 1.05 mg/dL (ref 0.61–1.24)
Chloride: 105 mmol/L (ref 101–111)
GFR calc Af Amer: >60 mL/min (ref >60)
GFR calc non Af Amer: >60 mL/min (ref >60)
GLUCOSE: 124 mg/dL — AB (ref 65–99)
Potassium: 4.1 mmol/L (ref 3.5–5.1)
Sodium: 139 mmol/L (ref 135–145)

## 2017-03-10 LAB — CBC
HEMATOCRIT: 30.8 % — AB (ref 39.0–52.0)
Hemoglobin: 9.9 g/dL — ABNORMAL LOW (ref 13.0–17.0)
MCH: 26.1 pg (ref 26.0–34.0)
MCHC: 32.1 g/dL (ref 30.0–36.0)
MCV: 81.3 fL (ref 78.0–100.0)
Platelets: 242 10*3/uL (ref 150–400)
RBC: 3.79 MIL/uL — ABNORMAL LOW (ref 4.22–5.81)
RDW: 14.7 % (ref 11.5–15.5)
WBC: 7.6 10*3/uL (ref 4.0–10.5)

## 2017-03-10 MED ORDER — POLYETHYLENE GLYCOL 3350 17 G PO PACK: 17.0000 g | PACK | Freq: Every day | ORAL | Status: DC

## 2017-03-10 MED ORDER — ALUM & MAG HYDROXIDE-SIMETH 200-200-20 MG/5ML PO SUSP
15.0000 mL | ORAL | Status: DC | PRN
  Administered 2017-03-11: 15 mL via ORAL
  Filled 2017-03-10: qty 30

## 2017-03-10 MED ORDER — FAMOTIDINE 20 MG PO TABS
20.0000 mg | ORAL_TABLET | Freq: Every day | ORAL | Status: DC
  Administered 2017-03-10 – 2017-03-11 (×2): 20 mg via ORAL
  Filled 2017-03-10 (×2): qty 1

## 2017-03-10 MED ORDER — DIVALPROEX SODIUM 250 MG PO DR TAB
250.0000 mg | DELAYED_RELEASE_TABLET | Freq: Every day | ORAL | Status: DC
  Administered 2017-03-11: 250 mg via ORAL
  Filled 2017-03-10: qty 1

## 2017-03-10 MED ORDER — DIVALPROEX SODIUM 250 MG PO DR TAB
500.0000 mg | DELAYED_RELEASE_TABLET | Freq: Every day | ORAL | Status: DC
  Administered 2017-03-10: 500 mg via ORAL
  Filled 2017-03-10: qty 2

## 2017-03-10 NOTE — Progress Notes (Signed)
Pt plan to discharge to SNF, CSW following.  

## 2017-03-10 NOTE — Progress Notes (Signed)
[facility] Progress Note  Patient: Derek Crawford [id number]:[id number]   PCP: No primary care provider on file. DOB: [birth year]   DOA: 02/22/2017   DOS: 03/10/2017   Date of Service: the patient was seen and examined on 03/10/2017  Subjective: complains about having some indigestion. No nausea no vomiting. No fever no chills. No shortness of breath.  Brief hospital course: 83 year old chronically ill male from a nursing home with CVA and right hemiparesis, seizures,  diabetes, GERD, debility/wheelchair-bound was admitted to the ICU on 8/15 due to respiratory failure from aspiration pneumonia and colonic ileus. CT on admission showed diffuse ileus, suspected cholelithiasis, patchy bilateral airspace opacities was admitted to the ICU, intubated, treated with IV cefepime, also followed by GI and general surgery, treated with NG decompression as well as a rectal tube. Completed 8 days of cefepime on 8/21 -Transferred to [facility] service  8/22, failed swallow eval-high aspiration risk 8/22 with worsening ileus s/p Flex sig on 8/23 8/24: failed swallow eval again, Palliative recommended, Gi signed off and recommended comfort care/palliative 8/26: Palliative meeting: remains Full Code, plan SNF with palliative care when tolerating PO 8/27 completed MBS, moderate oral and mild pharyngeal dysphagia-ordered liquid diet/Po intake Currently further plan is continue to wean TPN and advance diet.  Assessment and Plan: 1. Acute hypoxic respiratory failure Sepsis, aspiration pneumonia. Protein calorie malnutrition in the setting of acute illness.  -Due to sepsis/aspiration pneumonia possibly related to ileus and dysphagia -s/p VDRF -completed course of IV cefepime on 8/21 -s/p repeat SLP evaluation 8/24->NPO /high aspiration risk and palliative care meeting recommended -Remains on TNA -Pt more alert 8/26 onwards hence SLP re-assessment requested, MBS completed: remains mod aspiration risk but started on Full  liquids - Stop TPN. Continue full liquid. continue calorie count.  2. Colonic ileus - treated conservatively with NG decompression and a rectal tube -Followed by general surgery in ICU -was initially improving and NG tube out and rectal tube removed on 8/21 -then worsened again, s/p flex sig by GI which ruled out obstruction but continues to have ongoing intermittent ileus -severe dysphagia also another challenging issue at this time, per SLP eval in 2017 mild dysphagia then, but now severe and very high aspiration risk -unable to place PEG due to advance age/decreased cognition and anatomically most of stomach is in the chest, GI also recommends palliative care/comfort care, -Palliative care following Remains on TPN, we will reduce the rate of TPN and likely discontinue tomorrow night. Continue full liquid diet. QTC is 380. Starting back Reglan.  3. History of CVAs/right hemiparesis  -resume ASA/statin   4. History of seizures  -Continue Depakote by IV, change to Po when oral intake reliable  Based on my conversation with patient's RN at the nursing home this is primarily used for mood stabilizer rather than history of seizures. I will reduce the Depakote from every 8 hour to every 12 hours.dose adjusted today to a total of 750 mg daily.  5. Metabolic encephalopathy  -secondary to #1 and 2  -also on scheduled Reglan and when necessary fentanyl in ICU which I stopped few days back -slight improvement now Reduce the rate of Depakote from every 8 hours to every 12 hours.  6. Diabetes mellitus ,  -lantus on hold, SSI  7. Dysphagia -multifactorial, see above -Suspect mild compensated dysphagia related to previous stroke however exacerbated significantly in the setting of critical illness, long ICU stay and mechanical ventilation,  -s/p SLP re-eval again, high aspiration risk, palliative consult recommended, see above  Diet: Full liquid diet DVT Prophylaxis: subcutaneous  Heparin  Advance goals of care discussion: full code  Family Communication: no family was present at bedside, at the time of interview.   Disposition:  Discharge to SNF.  Consultants: gastroenterology, CCS, CCM primary admission, palliative care  Antibiotics: Anti-infectives    Start     Dose/Rate Route Frequency Ordered Stop   02/25/17 2000  ceFEPIme (MAXIPIME) 1 g in dextrose 5 % 50 mL IVPB     1 g 100 mL/hr over 30 Minutes Intravenous Every 8 hours 02/25/17 1252 02/28/17 2136   02/24/17 0200  vancomycin (VANCOCIN) IVPB 1000 mg/200 mL premix  Status:  Discontinued     1,000 mg 200 mL/hr over 60 Minutes Intravenous Every 48 hours 02/22/17 1051 02/23/17 0848   02/22/17 1200  vancomycin (VANCOCIN) 50 mg/mL oral solution 500 mg  Status:  Discontinued     500 mg Oral Every 6 hours 02/22/17 1018 02/22/17 1019   02/22/17 1200  vancomycin (VANCOCIN) 50 mg/mL oral solution 500 mg  Status:  Discontinued     500 mg Per Tube Every 6 hours 02/22/17 1019 02/22/17 2227   02/22/17 1200  ceFEPIme (MAXIPIME) 2 g in dextrose 5 % 50 mL IVPB  Status:  Discontinued     2 g 100 mL/hr over 30 Minutes Intravenous Every 24 hours 02/22/17 1052 02/25/17 1253   02/22/17 1030  metroNIDAZOLE (FLAGYL) IVPB 500 mg  Status:  Discontinued     500 mg 100 mL/hr over 60 Minutes Intravenous Every 8 hours 02/22/17 1014 02/23/17 0848   02/22/17 0300  vancomycin (VANCOCIN) IVPB 1000 mg/200 mL premix     1,000 mg 200 mL/hr over 60 Minutes Intravenous  Once 02/22/17 0257 02/22/17 0412   02/22/17 0300  piperacillin-tazobactam (ZOSYN) IVPB 3.375 g     3.375 g 100 mL/hr over 30 Minutes Intravenous  Once 02/22/17 0257 02/22/17 0345     Objective: Physical Exam: Vitals:   03/09/17 1415 03/09/17 2223 03/10/17 0639 03/10/17 1443  BP: (!) 143/70 (!) 122/52 114/60 (!) 110/41  Pulse: 64 67 80 (!) 55  Resp: 18 20 20 18   Temp: 97.7 F (36.5 C) 97.8 F (36.6 C) 98.7 F (37.1 C) 97.9 F (36.6 C)  TempSrc: Oral Oral Oral  Axillary  SpO2: 97% 100% 100% 99%  Weight:   86.7 kg (191 lb 2.2 oz)   Height:        Intake/Output Summary (Last 24 hours) at 03/10/17 1834 Last data filed at 03/10/17 1737  Gross per 24 hour  Intake              240 ml  Output              475 ml  Net             -235 ml   Filed Weights   03/08/17 0613 03/09/17 0446 03/10/17 0639  Weight: 88.7 kg (195 lb 8.8 oz) 88.4 kg (194 lb 14.2 oz) 86.7 kg (191 lb 2.2 oz)   General: Alert, Awake and Oriented to Time, Place and Person. Appear in mild distress, affect appropriate Eyes: PERRL, Conjunctiva normal ENT: Oral Mucosa clear moist. Neck: difficult to assess JVD, no Abnormal Mass Or lumps Cardiovascular: S1 and S2 Present, no Murmur, Peripheral Pulses Present Respiratory: normal respiratory effort, Bilateral Air entry equal and Decreased, no use of accessory muscle, basal Crackles, no wheezes Abdomen: Bowel Sound present, Soft and no tenderness, no hernia Skin: no redness, no Rash, no  induration Extremities: trace Pedal edema, no calf tenderness Neurologic: Grossly no focal neuro deficit. Bilaterally Equal motor strength  Data Reviewed: CBC:  Recent Labs Lab 03/05/17 0341 03/05/17 2142 03/06/17 0342 03/09/17 0458 03/10/17 0807  WBC 6.9 6.4 6.7 9.0 7.6  NEUTROABS  --   --  4.1 5.7  --   HGB 10.3* 10.4* 10.6* 10.6* 9.9*  HCT 31.8* 32.5* 32.5* 32.6* 30.8*  MCV 82.4 82.3 81.5 80.9 81.3  PLT 144* 185 185 233 027   Basic Metabolic Panel:  Recent Labs Lab 03/05/17 0341  03/06/17 0342 03/07/17 0545 03/08/17 0502 03/09/17 0458 03/10/17 0807  NA 141  < > 138 139 137 137 139  K 3.7  < > 3.5 3.4* 3.7 3.8 4.1  CL 103  < > 102 102 101 103 105  CO2 33*  < > 32 32 29 27 28   GLUCOSE 153*  < > 154* 166* 202* 176* 124*  BUN 17  < > 19 21* 19 15 17   CREATININE 0.76  < > 0.76 0.91 0.86 0.83 1.05  CALCIUM 8.3*  < > 8.5* 8.3* 8.6* 8.6* 8.5*  MG 1.8  --  1.8 1.7 1.7 1.8  --   PHOS 3.0  --  3.1 3.1 3.0 3.4  --   < > = values in  this interval not displayed. Liver Function Tests:  Recent Labs Lab 03/05/17 2142 03/06/17 0342 03/09/17 0458  AST 16 15 18   ALT 13* 12* 13*  ALKPHOS 37* 33* 46  BILITOT 0.3 0.1* 0.3  PROT 6.3* 6.5 6.8  ALBUMIN 2.4* 2.4* 2.6*   No results for input(s): LIPASE, AMYLASE in the last 168 hours.  Recent Labs Lab 03/08/17 1220  AMMONIA 34   Coagulation Profile: No results for input(s): INR, PROTIME in the last 168 hours. Cardiac Enzymes:  Recent Labs Lab 03/05/17 2142  TROPONINI <0.03   BNP (last 3 results) No results for input(s): PROBNP in the last 8760 hours. CBG:  Recent Labs Lab 03/10/17 0128 03/10/17 0458 03/10/17 0743 03/10/17 1145 03/10/17 1655  GLUCAP 90 102* 118* 144* 109*   Studies: No results found.  Scheduled Meds: . aspirin  81 mg Oral Daily  . atorvastatin  10 mg Oral q1800  . calcium carbonate  400 mg of elemental calcium Oral Once  . chlorhexidine  15 mL Mouth Rinse BID  . Chlorhexidine Gluconate Cloth  6 each Topical Daily  . [START ON 03/11/2017] divalproex  250 mg Oral Daily  . divalproex  500 mg Oral QHS  . enoxaparin (LOVENOX) injection  40 mg Subcutaneous Q24H  . famotidine  20 mg Oral Daily  . feeding supplement (GLUCERNA SHAKE)  237 mL Oral TID BM  . insulin aspart  0-15 Units Subcutaneous Q4H  . insulin glargine  5 Units Subcutaneous Daily  . latanoprost  1 drop Both Eyes QHS  . mouth rinse  15 mL Mouth Rinse QID  . metoCLOPramide (REGLAN) injection  10 mg Intravenous Q8H  . sodium chloride flush  10-40 mL Intracatheter Q12H   Continuous Infusions: . sodium chloride 250 mL (03/07/17 1332)   PRN Meds: sodium chloride, acetaminophen, albuterol, alum & mag hydroxide-simeth, sodium chloride flush  Time spent: 15minutes  Author: Berle Mull, MD [facility] Pager: [phone] 03/10/2017 6:34 PM  If 7PM-7AM, please contact night-coverage at [email or web address], password [email or web address]

## 2017-03-10 NOTE — Progress Notes (Signed)
Calorie Count Note  48 hour calorie count ordered.  Diet: Full liquid Supplements: Glucerna TID  Meals from 8/30 Lunch:  60 calories 1 g protein Dinner: 50 calories 1 g protein Supplements: took one sip with meds  Total intake: 110 kcal (6% of minimum estimated needs)  2 protein (2.2% of minimum estimated needs)  Nutrition Dx: Inadequate oral intake related to inability to eat as evidenced by NPO status. (Advanced to full liquids with minimal intake)  Goal: Patient will meet greater than or equal to 90% of their needs  Intervention:   Continue TPN per pharmacy recommendations Monitor for GOC post palliative care meeting Monitor and supplement electrolytes as need per MD descretion.   Mariana Single RD, LDN Clinical Nutrition Pager # [phone]

## 2017-03-11 LAB — BASIC METABOLIC PANEL
ANION GAP: 6 (ref 5–15)
BUN: 17 mg/dL (ref 6–20)
CHLORIDE: 108 mmol/L (ref 101–111)
CO2: 24 mmol/L (ref 22–32)
Calcium: 8.3 mg/dL — ABNORMAL LOW (ref 8.9–10.3)
Creatinine, Ser: 0.98 mg/dL (ref 0.61–1.24)
GFR calc Af Amer: >60 mL/min (ref >60)
Glucose, Bld: 123 mg/dL — ABNORMAL HIGH (ref 65–99)
POTASSIUM: 3.9 mmol/L (ref 3.5–5.1)
SODIUM: 138 mmol/L (ref 135–145)

## 2017-03-11 LAB — GLUCOSE, CAPILLARY
GLUCOSE-CAPILLARY: 102 mg/dL — AB (ref 65–99)
GLUCOSE-CAPILLARY: 119 mg/dL — AB (ref 65–99)
GLUCOSE-CAPILLARY: 91 mg/dL (ref 65–99)
GLUCOSE-CAPILLARY: 91 mg/dL (ref 65–99)
Glucose-Capillary: 78 mg/dL (ref 65–99)
Glucose-Capillary: 123 mg/dL — ABNORMAL HIGH (ref 65–99)
Glucose-Capillary: 100 mg/dL — ABNORMAL HIGH (ref 65–99)
Glucose-Capillary: 122 mg/dL — ABNORMAL HIGH (ref 65–99)

## 2017-03-11 LAB — CBC
HCT: 33.4 % — ABNORMAL LOW (ref 39.0–52.0)
HEMOGLOBIN: 10.7 g/dL — AB (ref 13.0–17.0)
MCH: 26.2 pg (ref 26.0–34.0)
MCHC: 32 g/dL (ref 30.0–36.0)
MCV: 81.7 fL (ref 78.0–100.0)
PLATELETS: 257 10*3/uL (ref 150–400)
RBC: 4.09 MIL/uL — AB (ref 4.22–5.81)
RDW: 14.7 % (ref 11.5–15.5)
WBC: 6.3 10*3/uL (ref 4.0–10.5)

## 2017-03-11 MED ORDER — OLANZAPINE 5 MG PO TABS
5.0000 mg | ORAL_TABLET | Freq: Every day | ORAL | Status: DC
  Administered 2017-03-11 – 2017-03-13 (×3): 5 mg via ORAL
  Filled 2017-03-11 (×3): qty 1

## 2017-03-11 MED ORDER — MEGESTROL ACETATE 400 MG/10ML PO SUSP
400.0000 mg | Freq: Every day | ORAL | Status: DC
  Administered 2017-03-11 – 2017-03-14 (×4): 400 mg via ORAL
  Filled 2017-03-11 (×4): qty 10

## 2017-03-11 MED ORDER — DIVALPROEX SODIUM 250 MG PO DR TAB
250.0000 mg | DELAYED_RELEASE_TABLET | Freq: Two times a day (BID) | ORAL | Status: DC
  Administered 2017-03-11 – 2017-03-12 (×2): 250 mg via ORAL
  Filled 2017-03-11 (×2): qty 1

## 2017-03-11 NOTE — Progress Notes (Signed)
[facility] Progress Note  Patient: Derek Crawford IWP:[id number]   PCP: No primary care provider on file. DOB: 1933/03/04   DOA: 02/22/2017   DOS: 03/11/2017   Date of Service: the patient was seen and examined on 03/11/2017  Subjective: Remains with poor by mouth intake. The wound calorie intake 110-calorie only 24 hours. No acute complaints.  Brief hospital course: 83 year old chronically ill male from a nursing home with CVA and right hemiparesis, seizures,  diabetes, GERD, debility/wheelchair-bound was admitted to the ICU on 8/15 due to respiratory failure from aspiration pneumonia and colonic ileus. CT on admission showed diffuse ileus, suspected cholelithiasis, patchy bilateral airspace opacities was admitted to the ICU, intubated, treated with IV cefepime, also followed by GI and general surgery, treated with NG decompression as well as a rectal tube. Completed 8 days of cefepime on 8/21 -Transferred to [facility] service  8/22, failed swallow eval-high aspiration risk 8/22 with worsening ileus s/p Flex sig on 8/23 8/24: failed swallow eval again, Palliative recommended, Gi signed off and recommended comfort care/palliative 8/26: Palliative meeting: remains Full Code, plan SNF with palliative care when tolerating PO 8/27 completed MBS, moderate oral and mild pharyngeal dysphagia-ordered liquid diet/Po intake Currently further plan is continue to wean TPN and advance diet.  Assessment and Plan: 1. Acute hypoxic respiratory failure Sepsis, aspiration pneumonia. Protein calorie malnutrition in the setting of acute illness.  -Due to sepsis/aspiration pneumonia possibly related to ileus and dysphagia -s/p VDRF -completed course of IV cefepime on 8/21 -s/p repeat SLP evaluation 8/24->NPO /high aspiration risk and palliative care meeting recommended -Pt more alert 8/26 onwards hence SLP re-assessment requested, MBS completed: remains mod aspiration risk but started on Full liquids - Stop  TPN. Continue full liquid. We will await for final results of the calorie count. Adding Megace as well as Zyprexa improved appetite. Continue to wean Depakote.  2. Colonic ileus - treated conservatively with NG decompression and a rectal tube -Followed by general surgery in ICU -was initially improving and NG tube out and rectal tube removed on 8/21 -then worsened again, s/p flex sig by GI which ruled out obstruction but continues to have ongoing intermittent ileus -severe dysphagia also another challenging issue at this time, per SLP eval in 2017 mild dysphagia then, but now severe and very high aspiration risk -unable to place PEG due to advance age/decreased cognition and anatomically most of stomach is in the chest, GI also recommends palliative care/comfort care, -Palliative care following Remains on TPN, we will reduce the rate of TPN and likely discontinue tomorrow night. Continue full liquid diet. QTC is 380. Starting back Reglan.  3. History of CVAs/right hemiparesis  -resume ASA/statin   4. Mood disorder. Remote history of seizure. -Continue Depakote by IV, change to Po when oral intake reliable  Based on my conversation with patient's RN at the nursing home this is primarily used for mood stabilizer rather than history of seizures. We will continue to reduce the dose, today t0 250 mg twice a day.  5. Metabolic encephalopathy  -secondary to #1 and 2  -also on scheduled Reglan and when necessary fentanyl in ICU which I stopped few days back -slight improvement now Reduce the rate of Depakote from every 8 hours to every 12 hours.  6. Diabetes mellitus ,  -lantus on hold, SSI  7. Dysphagia Protein calorie malnutrition -multifactorial, see above -Suspect mild compensated dysphagia related to previous stroke however exacerbated significantly in the setting of critical illness, long ICU stay and mechanical ventilation,  -  s/p SLP re-eval again, high aspiration risk,  palliative consult recommended, see above  Diet: Full liquid diet DVT Prophylaxis: subcutaneous Heparin  Advance goals of care discussion: full code  Family Communication: no family was present at bedside, at the time of interview.   Disposition:  Discharge to SNF.  Consultants: gastroenterology, CCS, CCM primary admission, palliative care  Antibiotics: Anti-infectives    Start     Dose/Rate Route Frequency Ordered Stop   02/25/17 2000  ceFEPIme (MAXIPIME) 1 g in dextrose 5 % 50 mL IVPB     1 g 100 mL/hr over 30 Minutes Intravenous Every 8 hours 02/25/17 1252 02/28/17 2136   02/24/17 0200  vancomycin (VANCOCIN) IVPB 1000 mg/200 mL premix  Status:  Discontinued     1,000 mg 200 mL/hr over 60 Minutes Intravenous Every 48 hours 02/22/17 1051 02/23/17 0848   02/22/17 1200  vancomycin (VANCOCIN) 50 mg/mL oral solution 500 mg  Status:  Discontinued     500 mg Oral Every 6 hours 02/22/17 1018 02/22/17 1019   02/22/17 1200  vancomycin (VANCOCIN) 50 mg/mL oral solution 500 mg  Status:  Discontinued     500 mg Per Tube Every 6 hours 02/22/17 1019 02/22/17 2227   02/22/17 1200  ceFEPIme (MAXIPIME) 2 g in dextrose 5 % 50 mL IVPB  Status:  Discontinued     2 g 100 mL/hr over 30 Minutes Intravenous Every 24 hours 02/22/17 1052 02/25/17 1253   02/22/17 1030  metroNIDAZOLE (FLAGYL) IVPB 500 mg  Status:  Discontinued     500 mg 100 mL/hr over 60 Minutes Intravenous Every 8 hours 02/22/17 1014 02/23/17 0848   02/22/17 0300  vancomycin (VANCOCIN) IVPB 1000 mg/200 mL premix     1,000 mg 200 mL/hr over 60 Minutes Intravenous  Once 02/22/17 0257 02/22/17 0412   02/22/17 0300  piperacillin-tazobactam (ZOSYN) IVPB 3.375 g     3.375 g 100 mL/hr over 30 Minutes Intravenous  Once 02/22/17 0257 02/22/17 0345     Objective: Physical Exam: Vitals:   03/11/17 0416 03/11/17 0434 03/11/17 1135 03/11/17 1407  BP: (!) 119/55   (!) 119/49  Pulse: 69  67 64  Resp: 20  18 18   Temp: 98.4 F (36.9 C)   98.7  F (37.1 C)  TempSrc: Axillary   Oral  SpO2: 98%  97% 96%  Weight:  87 kg (191 lb 12.8 oz)    Height:        Intake/Output Summary (Last 24 hours) at 03/11/17 1448 Last data filed at 03/11/17 1000  Gross per 24 hour  Intake            899.5 ml  Output              330 ml  Net            569.5 ml   Filed Weights   03/09/17 0446 03/10/17 0639 03/11/17 0434  Weight: 88.4 kg (194 lb 14.2 oz) 86.7 kg (191 lb 2.2 oz) 87 kg (191 lb 12.8 oz)   General: Alert, Awake and Oriented to Time, Place and Person. Appear in mild distress, affect appropriate Eyes: PERRL, Conjunctiva normal ENT: Oral Mucosa clear moist. Neck: difficult to assess JVD, no Abnormal Mass Or lumps Cardiovascular: S1 and S2 Present, no Murmur, Peripheral Pulses Present Respiratory: normal respiratory effort, Bilateral Air entry equal and Decreased, no use of accessory muscle, basal Crackles, no wheezes Abdomen: Bowel Sound present, Soft and no tenderness, no hernia Skin: no redness, no  Rash, no induration Extremities: trace Pedal edema, no calf tenderness Neurologic: Grossly no focal neuro deficit. Bilaterally Equal motor strength  Data Reviewed: CBC:  Recent Labs Lab 03/05/17 2142 03/06/17 0342 03/09/17 0458 03/10/17 0807 03/11/17 0519  WBC 6.4 6.7 9.0 7.6 6.3  NEUTROABS  --  4.1 5.7  --   --   HGB 10.4* 10.6* 10.6* 9.9* 10.7*  HCT 32.5* 32.5* 32.6* 30.8* 33.4*  MCV 82.3 81.5 80.9 81.3 81.7  PLT 185 185 233 242 644   Basic Metabolic Panel:  Recent Labs Lab 03/05/17 0341  03/06/17 0342 03/07/17 0545 03/08/17 0502 03/09/17 0458 03/10/17 0807 03/11/17 0519  NA 141  < > 138 139 137 137 139 138  K 3.7  < > 3.5 3.4* 3.7 3.8 4.1 3.9  CL 103  < > 102 102 101 103 105 108  CO2 33*  < > 32 32 29 27 28 24   GLUCOSE 153*  < > 154* 166* 202* 176* 124* 123*  BUN 17  < > 19 21* 19 15 17 17   CREATININE 0.76  < > 0.76 0.91 0.86 0.83 1.05 0.98  CALCIUM 8.3*  < > 8.5* 8.3* 8.6* 8.6* 8.5* 8.3*  MG 1.8  --  1.8 1.7  1.7 1.8  --   --   PHOS 3.0  --  3.1 3.1 3.0 3.4  --   --   < > = values in this interval not displayed. Liver Function Tests:  Recent Labs Lab 03/05/17 2142 03/06/17 0342 03/09/17 0458  AST 16 15 18   ALT 13* 12* 13*  ALKPHOS 37* 33* 46  BILITOT 0.3 0.1* 0.3  PROT 6.3* 6.5 6.8  ALBUMIN 2.4* 2.4* 2.6*   No results for input(s): LIPASE, AMYLASE in the last 168 hours.  Recent Labs Lab 03/08/17 1220  AMMONIA 34   Coagulation Profile: No results for input(s): INR, PROTIME in the last 168 hours. Cardiac Enzymes:  Recent Labs Lab 03/05/17 2142  TROPONINI <0.03   BNP (last 3 results) No results for input(s): PROBNP in the last 8760 hours. CBG:  Recent Labs Lab 03/11/17 0015 03/11/17 0414 03/11/17 0529 03/11/17 0742 03/11/17 1138  GLUCAP 91 78 123* 100* 119*   Studies: No results found.  Scheduled Meds: . aspirin  81 mg Oral Daily  . atorvastatin  10 mg Oral q1800  . chlorhexidine  15 mL Mouth Rinse BID  . divalproex  250 mg Oral Q12H  . enoxaparin (LOVENOX) injection  40 mg Subcutaneous Q24H  . feeding supplement (GLUCERNA SHAKE)  237 mL Oral TID BM  . insulin aspart  0-15 Units Subcutaneous Q4H  . insulin glargine  5 Units Subcutaneous Daily  . latanoprost  1 drop Both Eyes QHS  . mouth rinse  15 mL Mouth Rinse QID  . megestrol  400 mg Oral Daily  . metoCLOPramide (REGLAN) injection  10 mg Intravenous Q8H  . OLANZapine  5 mg Oral QHS   Continuous Infusions:  PRN Meds: acetaminophen, albuterol, alum & mag hydroxide-simeth  Time spent: 37minutes  Author: Berle Mull, MD [facility] Pager: [phone] 03/11/2017 2:48 PM  If 7PM-7AM, please contact night-coverage at [email or web address], password [facility]

## 2017-03-11 NOTE — Progress Notes (Signed)
RT educated patient on use of incentive spirometer. Patient very sleepy. Patient attempted but was unable to achieve more than 250 ml.

## 2017-03-11 NOTE — Progress Notes (Addendum)
Daily Progress Note   Patient Name: Derek Crawford       Date: 03/11/2017 DOB: December 14, 1932  Age: 83 y.o. MRN#: [id number] Attending Physician: Lavina Hamman, MD Primary Care Physician: No primary care provider on file. Admit Date: 02/22/2017  Reason for Consultation/Follow-up: Establishing goals of care  Subjective: Derek Crawford is sleeping in bed. He awakens briefly to answer questions and then goes back to sleep.  He denies pain or discomfort.  RN reports continued poor oral intake.   Encouraged Mr. Prosch to take in what he can, and he reports that he would like to get up to a chair.  Length of Stay: 17  Current Medications: Scheduled Meds:  . aspirin  81 mg Oral Daily  . atorvastatin  10 mg Oral q1800  . chlorhexidine  15 mL Mouth Rinse BID  . divalproex  250 mg Oral Q12H  . enoxaparin (LOVENOX) injection  40 mg Subcutaneous Q24H  . feeding supplement (GLUCERNA SHAKE)  237 mL Oral TID BM  . insulin aspart  0-15 Units Subcutaneous Q4H  . insulin glargine  5 Units Subcutaneous Daily  . latanoprost  1 drop Both Eyes QHS  . mouth rinse  15 mL Mouth Rinse QID  . megestrol  400 mg Oral Daily  . metoCLOPramide (REGLAN) injection  10 mg Intravenous Q8H  . OLANZapine  5 mg Oral QHS    Continuous Infusions:   PRN Meds: acetaminophen, albuterol, alum & mag hydroxide-simeth  Physical Exam  Constitutional: No distress.  Resting with eyes closed, easily arousable. Later alert and looking around room.   HENT:  Head: Normocephalic and atraumatic.  Eyes: EOM are normal.  Cardiovascular:  Warm and dry  Pulmonary/Chest: Effort normal. No respiratory distress.  Abdominal: He exhibits distension.  Neurological: He is alert.            Vital Signs: BP (!) 119/55 (BP Location: Right Arm)    Pulse 67   Temp 98.4 F (36.9 C) (Axillary)   Resp 18   Ht 5\' 9"  (1.753 m)   Wt 87 kg (191 lb 12.8 oz)   SpO2 97%   BMI 28.32 kg/m  SpO2: SpO2: 97 % O2 Device: O2 Device: Not Delivered O2 Flow Rate: O2 Flow Rate (L/min): 4 L/min  Intake/output summary:   Intake/Output Summary (Last 24 hours)  at 03/11/17 1258 Last data filed at 03/11/17 0700  Gross per 24 hour  Intake            599.5 ml  Output              330 ml  Net            269.5 ml   LBM: Last BM Date: 03/09/17 Baseline Weight: Weight: 90.7 kg (200 lb) Most recent weight: Weight: 87 kg (191 lb 12.8 oz)       Palliative Assessment/Data: 40%      Patient Active Problem List   Diagnosis Date Noted  . Sepsis ([place])   . Dysphagia   . Encounter for palliative care   . Goals of care, counseling/discussion   . Abdominal distention   . Respiratory failure ([place]) 02/22/2017  . Ileus ([place])   . Encounter for central line placement   . Depressed mood 12/09/2013  . Allergic rhinitis 12/09/2013  . Psychosis 10/21/2013  . BPH (benign prostatic hyperplasia) 09/16/2013  . Large Tubular adenoma right colon 08/23/2013  . Unspecified constipation 08/07/2013  . Hypertension   . Hyperlipidemia   . Urinary incontinence   . Iron deficiency anemia 05/22/2013  . Dyslipidemia 05/22/2013  . Hemiplegia ([place]) 04/10/2007  . Coronary atherosclerosis 04/10/2007  . CVA 04/10/2007  . GERD 04/10/2007  . GLAUCOMA, HX OF 04/10/2007  . PERCUTANEOUS TRANSLUMINAL CORONARY ANGIOPLASTY, HX OF 04/10/2007    Palliative Care Assessment & Plan   Patient Profile:  83 year old gentleman nursing home resident prior past medical history of stroke with right-sided weakness seizures, diabetes, reflux, baseline status of debility/wheelchair bound. Patient was initially admitted to the ICU and has been hospitalized since August 15. Initially for respiratory failure from aspiration pneumonia and colonic ileus. Patient was also intubated during early  part of this hospitalization. He has been on broad-spectrum antibiotics. He has been followed by gastroenterology and general surgery. The patient underwent flexible sigmoidoscopy and has been regularly followed by speech and swallow therapy.   Assessment: Derek Crawford is resting in bed, he briefly awakens to answer questions and goes back to sleep.   Recommendations/Plan:  Transition off TPN.  Await results of calorie count, however remain concerned about limited intake.  D/w Dr. Posey Pronto and continuing to wean Depakote with hope for improved alertness and increased oral itake. Also started on Megace and will add Zyprexa to see if stimulates appetite and possible benefit if sleepiness is due to hypoactive delirium.   Left VM for son, Marden Noble.  Will reevaluate tomorrow.       Goals of Care and Additional Recommendations:  Full code at this time.   Code Status:    Code Status Orders        Start     Ordered   02/22/17 0651  Full code  Continuous     02/22/17 0651    Code Status History    Date Active Date Inactive Code Status Order ID Comments User Context   This patient has a current code status but no historical code status.       Prognosis:   Unable to determine   Discharge Planning:  SNF rehab with palliative services on d/c.    Thank you for allowing the Palliative Medicine Team to assist in the care of this patient.   Time In: 1200 Time Out: 1220 Total Time 20 Prolonged Time Billed No      Greater than 50%  of this time was spent counseling and  coordinating care related to the above assessment and plan.  Please contact Palliative Medicine Team phone at [phone] for questions and concerns.   Micheline Rough, MD [place] Team [phone]

## 2017-03-12 LAB — CBC
HEMATOCRIT: 30.1 % — AB (ref 39.0–52.0)
HEMOGLOBIN: 9.8 g/dL — AB (ref 13.0–17.0)
MCH: 26.1 pg (ref 26.0–34.0)
MCHC: 32.6 g/dL (ref 30.0–36.0)
MCV: 80.3 fL (ref 78.0–100.0)
Platelets: 264 10*3/uL (ref 150–400)
RBC: 3.75 MIL/uL — ABNORMAL LOW (ref 4.22–5.81)
RDW: 14.6 % (ref 11.5–15.5)
WBC: 5.7 10*3/uL (ref 4.0–10.5)

## 2017-03-12 LAB — BASIC METABOLIC PANEL
Anion gap: 8 (ref 5–15)
BUN: 14 mg/dL (ref 6–20)
CALCIUM: 8.5 mg/dL — AB (ref 8.9–10.3)
CHLORIDE: 110 mmol/L (ref 101–111)
CO2: 24 mmol/L (ref 22–32)
CREATININE: 0.99 mg/dL (ref 0.61–1.24)
GFR calc non Af Amer: >60 mL/min (ref >60)
GLUCOSE: 92 mg/dL (ref 65–99)
Potassium: 3.5 mmol/L (ref 3.5–5.1)
Sodium: 142 mmol/L (ref 135–145)

## 2017-03-12 LAB — GLUCOSE, CAPILLARY
GLUCOSE-CAPILLARY: 73 mg/dL (ref 65–99)
GLUCOSE-CAPILLARY: 83 mg/dL (ref 65–99)
GLUCOSE-CAPILLARY: 86 mg/dL (ref 65–99)
Glucose-Capillary: 108 mg/dL — ABNORMAL HIGH (ref 65–99)
Glucose-Capillary: 103 mg/dL — ABNORMAL HIGH (ref 65–99)
Glucose-Capillary: 106 mg/dL — ABNORMAL HIGH (ref 65–99)

## 2017-03-12 MED ORDER — RESOURCE THICKENUP CLEAR PO POWD
ORAL | Status: DC | PRN
  Filled 2017-03-12: qty 125

## 2017-03-12 MED ORDER — PRO-STAT SUGAR FREE PO LIQD
30.0000 mL | Freq: Two times a day (BID) | ORAL | Status: DC
  Administered 2017-03-12 – 2017-03-14 (×4): 30 mL via ORAL
  Filled 2017-03-12 (×5): qty 30

## 2017-03-12 MED ORDER — ONDANSETRON HCL 4 MG/2ML IJ SOLN: 4.0000 mg | Freq: Three times a day (TID) | INTRAMUSCULAR | Status: DC

## 2017-03-12 MED ORDER — ONDANSETRON HCL 4 MG/2ML IJ SOLN: 4.0000 mg | Freq: Three times a day (TID) | INTRAMUSCULAR | Status: DC | PRN

## 2017-03-12 MED ORDER — ONDANSETRON HCL 4 MG/2ML IJ SOLN
4.0000 mg | Freq: Three times a day (TID) | INTRAMUSCULAR | Status: AC
  Administered 2017-03-12 – 2017-03-14 (×6): 4 mg via INTRAVENOUS
  Filled 2017-03-12 (×6): qty 2

## 2017-03-12 NOTE — Progress Notes (Signed)
Calorie Count Note  48 hour calorie count ordered. Day 2 and 3 results below.  Diet: Full liquid Supplements: Glucerna Shake po TID, each supplement provides 220 kcal and 10 grams of protein  Meals from 8/31: Breakfast: 135 kcal, 2g protein Lunch:  0% Dinner: 30 kcal Supplements: 0%  Total intake: 165 kcal (9% of minimum estimated needs)  2g protein (2% of minimum estimated needs)  Meals from 9/1: Breakfast: 210 kcal, 9g protein Lunch:  95 kcal, 1g protein Dinner: 0% Supplements: 284 kcal, 13g protein  Total intake: 589 kcal (32% of minimum estimated needs)  23g protein (25% of minimum estimated needs)  Estimated Nutritional Needs:  Kcal:  1884-1660 (20-23 kcal/kg) Protein:  90-100 grams   Nutrition Dx: Inadequate oral intakerelated to inability to eatas evidenced by NPO status. (Advanced to full liquids with minimal intake)  Goal: Patient will meet greater than or equal to 90% of their needs  Intervention:   -Continue TPN per pharmacy recommendations -Continue Glucerna shakes TID  -Will provide Prostat liquid protein PO 30 ml BID with meals, each supplement provides 100 kcal, 15 grams protein. -Monitor for [place] post palliative care meeting -Monitor and supplement electrolytes as need per MD  Clayton Bibles, MS, RD, LDN Pager: [phone] After Hours Pager: [phone]

## 2017-03-12 NOTE — Progress Notes (Signed)
[facility] Progress Note  Patient: Derek Crawford RDE:[id number]   PCP: No primary care provider on file. DOB: 01-01-1933   DOA: 02/22/2017   DOS: 03/12/2017   Date of Service: the patient was seen and examined on 03/12/2017  Subjective: Per patient's RN, patient drank 1 or loose Cerner yesterday, it full part of his potato soup in dinner and also had 50% of his breakfast. No vomiting. More awake now.  Brief hospital course: 83 year old chronically ill male from a nursing home with CVA and right hemiparesis, seizures,  diabetes, GERD, debility/wheelchair-bound was admitted to the ICU on 8/15 due to respiratory failure from aspiration pneumonia and colonic ileus. CT on admission showed diffuse ileus, suspected cholelithiasis, patchy bilateral airspace opacities was admitted to the ICU, intubated, treated with IV cefepime, also followed by GI and general surgery, treated with NG decompression as well as a rectal tube. Completed 8 days of cefepime on 8/21 -Transferred to [facility] service  8/22, failed swallow eval-high aspiration risk 8/22 with worsening ileus s/p Flex sig on 8/23 8/24: failed swallow eval again, Palliative recommended, Gi signed off and recommended comfort care/palliative 8/26: Palliative meeting: remains Full Code, plan SNF with palliative care when tolerating PO 8/27 completed MBS, moderate oral and mild pharyngeal dysphagia-ordered liquid diet/Po intake Currently further plan is continue to wean TPN and advance diet.  Assessment and Plan: 1. Acute hypoxic respiratory failure Sepsis, aspiration pneumonia. Protein calorie malnutrition in the setting of acute illness.  -Due to sepsis/aspiration pneumonia possibly related to ileus and dysphagia -s/p VDRF -completed course of IV cefepime on 8/21 -s/p repeat SLP evaluation 8/24->NPO /high aspiration risk and palliative care meeting recommended -Pt more alert 8/26 onwards hence SLP re-assessment requested, MBS completed: remains  mod aspiration risk but started on Full liquids - Stop TPN. Continue full liquid. We will await for final results of the calorie count. Adding Megace as well as Zyprexa improved appetite. Continue to wean Depakote. - Reconsult speech therapy for final recommendation on discharge for diet.  2. Colonic ileus - treated conservatively with NG decompression and a rectal tube -Followed by general surgery in ICU -was initially improving and NG tube out and rectal tube removed on 8/21 -then worsened again, s/p flex sig by GI which ruled out obstruction but continues to have ongoing intermittent ileus -severe dysphagia also another challenging issue at this time, per SLP eval in 2017 mild dysphagia then, but now severe and very high aspiration risk -unable to place PEG due to advance age/decreased cognition and anatomically most of stomach is in the chest, GI also recommends palliative care/comfort care. -Palliative care following Continue full liquid diet. QTC is 380. Started back on Reglan, I would change it to scheduled Zofran for 2 days and then stop.  3. History of CVAs/right hemiparesis  -resume ASA/statin   4. Mood disorder. Remote history of seizure. Based on my conversation with patient's RN at the nursing home this is primarily used for mood stabilizer rather than history of seizures. Depakote was slowly weaned off and currently stopped. Placing the patient on Zyprexa for hypoactive delirium.  5. Metabolic encephalopathy  -secondary to #1 and 2  As well as a combination of polypharmacy. Minimize all psychotropic medications including Depakote  6. Diabetes mellitus ,  -lantus on low-dose, SSI  7. Dysphagia Protein calorie malnutrition -multifactorial, see above -Suspect mild compensated dysphagia related to previous stroke however exacerbated significantly in the setting of critical illness, long ICU stay and mechanical ventilation,  -s/p SLP re-eval again,  high aspiration  risk, palliative consult recommended, see above - Oral appetite is improving on full liquid diet with reduction in psychotropic medications. Will discuss with speech therapy tomorrow regarding final recommendation.  Diet: Full liquid diet DVT Prophylaxis: subcutaneous Heparin  Advance goals of care discussion: full code  Family Communication: family was present at bedside, at the time of interview.   Disposition:  Discharge to SNF.  Consultants: gastroenterology, CCS, CCM primary admission, palliative care  Antibiotics: Anti-infectives    Start     Dose/Rate Route Frequency Ordered Stop   02/25/17 2000  ceFEPIme (MAXIPIME) 1 g in dextrose 5 % 50 mL IVPB     1 g 100 mL/hr over 30 Minutes Intravenous Every 8 hours 02/25/17 1252 02/28/17 2136   02/24/17 0200  vancomycin (VANCOCIN) IVPB 1000 mg/200 mL premix  Status:  Discontinued     1,000 mg 200 mL/hr over 60 Minutes Intravenous Every 48 hours 02/22/17 1051 02/23/17 0848   02/22/17 1200  vancomycin (VANCOCIN) 50 mg/mL oral solution 500 mg  Status:  Discontinued     500 mg Oral Every 6 hours 02/22/17 1018 02/22/17 1019   02/22/17 1200  vancomycin (VANCOCIN) 50 mg/mL oral solution 500 mg  Status:  Discontinued     500 mg Per Tube Every 6 hours 02/22/17 1019 02/22/17 2227   02/22/17 1200  ceFEPIme (MAXIPIME) 2 g in dextrose 5 % 50 mL IVPB  Status:  Discontinued     2 g 100 mL/hr over 30 Minutes Intravenous Every 24 hours 02/22/17 1052 02/25/17 1253   02/22/17 1030  metroNIDAZOLE (FLAGYL) IVPB 500 mg  Status:  Discontinued     500 mg 100 mL/hr over 60 Minutes Intravenous Every 8 hours 02/22/17 1014 02/23/17 0848   02/22/17 0300  vancomycin (VANCOCIN) IVPB 1000 mg/200 mL premix     1,000 mg 200 mL/hr over 60 Minutes Intravenous  Once 02/22/17 0257 02/22/17 0412   02/22/17 0300  piperacillin-tazobactam (ZOSYN) IVPB 3.375 g     3.375 g 100 mL/hr over 30 Minutes Intravenous  Once 02/22/17 0257 02/22/17 0345     Objective: Physical  Exam: Vitals:   03/11/17 2011 03/12/17 0437 03/12/17 0459 03/12/17 0756  BP: (!) 116/54 (!) 106/46  124/86  Pulse: 66 (!) 55  83  Resp: 17 18  20   Temp: 98.6 F (37 C) 98.6 F (37 C)  98.3 F (36.8 C)  TempSrc: Oral Oral  Oral  SpO2: 94% 97%  100%  Weight:   87.3 kg (192 lb 7.4 oz)   Height:        Intake/Output Summary (Last 24 hours) at 03/12/17 1055 Last data filed at 03/12/17 0553  Gross per 24 hour  Intake              350 ml  Output             1100 ml  Net             -750 ml   Filed Weights   03/10/17 0639 03/11/17 0434 03/12/17 0459  Weight: 86.7 kg (191 lb 2.2 oz) 87 kg (191 lb 12.8 oz) 87.3 kg (192 lb 7.4 oz)   General: Alert, Awake and Oriented to Time, Place and Person. Appear in mild distress, affect appropriate Eyes: PERRL, Conjunctiva normal ENT: Oral Mucosa clear moist. Neck: difficult to assess JVD, no Abnormal Mass Or lumps Cardiovascular: S1 and S2 Present, no Murmur, Peripheral Pulses Present Respiratory: normal respiratory effort, Bilateral Air entry equal and  Decreased, no use of accessory muscle, basal Crackles, no wheezes Abdomen: Bowel Sound present, Soft and no tenderness, no hernia Skin: no redness, no Rash, no induration Extremities: trace Pedal edema, no calf tenderness Neurologic: Grossly no focal neuro deficit. Bilaterally Equal motor strength  Data Reviewed: CBC:  Recent Labs Lab 03/06/17 0342 03/09/17 0458 03/10/17 0807 03/11/17 0519 03/12/17 0437  WBC 6.7 9.0 7.6 6.3 5.7  NEUTROABS 4.1 5.7  --   --   --   HGB 10.6* 10.6* 9.9* 10.7* 9.8*  HCT 32.5* 32.6* 30.8* 33.4* 30.1*  MCV 81.5 80.9 81.3 81.7 80.3  PLT 185 233 242 257 546   Basic Metabolic Panel:  Recent Labs Lab 03/06/17 0342 03/07/17 0545 03/08/17 0502 03/09/17 0458 03/10/17 0807 03/11/17 0519 03/12/17 0437  NA 138 139 137 137 139 138 142  K 3.5 3.4* 3.7 3.8 4.1 3.9 3.5  CL 102 102 101 103 105 108 110  CO2 32 32 29 27 28 24 24   GLUCOSE 154* 166* 202* 176*  124* 123* 92  BUN 19 21* 19 15 17 17 14   CREATININE 0.76 0.91 0.86 0.83 1.05 0.98 0.99  CALCIUM 8.5* 8.3* 8.6* 8.6* 8.5* 8.3* 8.5*  MG 1.8 1.7 1.7 1.8  --   --   --   PHOS 3.1 3.1 3.0 3.4  --   --   --    Liver Function Tests:  Recent Labs Lab 03/05/17 2142 03/06/17 0342 03/09/17 0458  AST 16 15 18   ALT 13* 12* 13*  ALKPHOS 37* 33* 46  BILITOT 0.3 0.1* 0.3  PROT 6.3* 6.5 6.8  ALBUMIN 2.4* 2.4* 2.6*   No results for input(s): LIPASE, AMYLASE in the last 168 hours.  Recent Labs Lab 03/08/17 1220  AMMONIA 34   Coagulation Profile: No results for input(s): INR, PROTIME in the last 168 hours. Cardiac Enzymes:  Recent Labs Lab 03/05/17 2142  TROPONINI <0.03   BNP (last 3 results) No results for input(s): PROBNP in the last 8760 hours. CBG:  Recent Labs Lab 03/11/17 1637 03/11/17 2007 03/11/17 2333 03/12/17 0432 03/12/17 0724  GLUCAP 102* 122* 91 86 83   Studies: No results found.  Scheduled Meds: . aspirin  81 mg Oral Daily  . atorvastatin  10 mg Oral q1800  . chlorhexidine  15 mL Mouth Rinse BID  . divalproex  250 mg Oral Q12H  . enoxaparin (LOVENOX) injection  40 mg Subcutaneous Q24H  . feeding supplement (GLUCERNA SHAKE)  237 mL Oral TID BM  . insulin aspart  0-15 Units Subcutaneous Q4H  . insulin glargine  5 Units Subcutaneous Daily  . latanoprost  1 drop Both Eyes QHS  . mouth rinse  15 mL Mouth Rinse QID  . megestrol  400 mg Oral Daily  . metoCLOPramide (REGLAN) injection  10 mg Intravenous Q8H  . OLANZapine  5 mg Oral QHS   Continuous Infusions:  PRN Meds: acetaminophen, albuterol, alum & mag hydroxide-simeth  Time spent: 78minutes  Author: Berle Mull, MD [facility] Pager: [phone] 03/12/2017 10:55 AM  If 7PM-7AM, please contact night-coverage at [email or web address], password [email or web address]

## 2017-03-12 NOTE — Progress Notes (Signed)
Daily Progress Note   Patient Name: Derek Crawford       Date: 03/12/2017 DOB: [birth year]  Age: 83 y.o. MRN#: [id number] Attending Physician: Lavina Hamman, MD Primary Care Physician: No primary care provider on file. Admit Date: 02/22/2017  Reason for Consultation/Follow-up: Establishing goals of care  Subjective: Mr. Bacci is sleeping in bed. He awakens briefly to answer questions and then goes back to sleep.  He denies pain or discomfort.  RN reports still with low oral intake overall, but has been improving over the last day or two.   I then saw his son, Marden Noble, in the hall.  We discussed hope that he will continue to improve as well as concern that he is still decreased in his intake.  We reviewed a MOST form and began to discuss how to develop plan of care to focus on continuing therapies that would maximize chance of being well enough to have quality time outside of the hospital and limiting therapies not in line with this goal.  Length of Stay: 18  Current Medications: Scheduled Meds:  . aspirin  81 mg Oral Daily  . atorvastatin  10 mg Oral q1800  . chlorhexidine  15 mL Mouth Rinse BID  . enoxaparin (LOVENOX) injection  40 mg Subcutaneous Q24H  . feeding supplement (GLUCERNA SHAKE)  237 mL Oral TID BM  . feeding supplement (PRO-STAT SUGAR FREE 64)  30 mL Oral BID  . insulin aspart  0-15 Units Subcutaneous Q4H  . insulin glargine  5 Units Subcutaneous Daily  . latanoprost  1 drop Both Eyes QHS  . mouth rinse  15 mL Mouth Rinse QID  . megestrol  400 mg Oral Daily  . OLANZapine  5 mg Oral QHS  . ondansetron (ZOFRAN) IV  4 mg Intravenous Q8H    Continuous Infusions:   PRN Meds: acetaminophen, albuterol, alum & mag hydroxide-simeth, [START ON 03/13/2017] ondansetron (ZOFRAN)  IV  Physical Exam  Constitutional: No distress.  Resting with eyes closed, easily arousable  HENT:  Head: Normocephalic and atraumatic.  Eyes: EOM are normal.  Cardiovascular:  Warm and dry  Pulmonary/Chest: Effort normal. No respiratory distress.  Abdominal: He exhibits distension.  Neurological: He is alert.            Vital Signs: BP 124/86 (BP Location: Right Arm)  Pulse 83   Temp 98.3 F (36.8 C) (Oral)   Resp 20   Ht 5\' 9"  (1.753 m)   Wt 87.3 kg (192 lb 7.4 oz)   SpO2 100%   BMI 28.42 kg/m  SpO2: SpO2: 100 % O2 Device: O2 Device: Not Delivered O2 Flow Rate: O2 Flow Rate (L/min): 4 L/min  Intake/output summary:   Intake/Output Summary (Last 24 hours) at 03/12/17 1311 Last data filed at 03/12/17 1106  Gross per 24 hour  Intake              230 ml  Output             1400 ml  Net            -1170 ml   LBM: Last BM Date: 03/11/17 Baseline Weight: Weight: 90.7 kg (200 lb) Most recent weight: Weight: 87.3 kg (192 lb 7.4 oz)       Palliative Assessment/Data: 40%      Patient Active Problem List   Diagnosis Date Noted  . Sepsis ([place])   . Dysphagia   . Encounter for palliative care   . Goals of care, counseling/discussion   . Abdominal distention   . Respiratory failure ([place]) 02/22/2017  . Ileus ([place])   . Encounter for central line placement   . Depressed mood 12/09/2013  . Allergic rhinitis 12/09/2013  . Psychosis 10/21/2013  . BPH (benign prostatic hyperplasia) 09/16/2013  . Large Tubular adenoma right colon 08/23/2013  . Unspecified constipation 08/07/2013  . Hypertension   . Hyperlipidemia   . Urinary incontinence   . Iron deficiency anemia 05/22/2013  . Dyslipidemia 05/22/2013  . Hemiplegia ([place]) 04/10/2007  . Coronary atherosclerosis 04/10/2007  . CVA 04/10/2007  . GERD 04/10/2007  . GLAUCOMA, HX OF 04/10/2007  . PERCUTANEOUS TRANSLUMINAL CORONARY ANGIOPLASTY, HX OF 04/10/2007    Palliative Care Assessment & Plan   Patient Profile:   83 year old gentleman nursing home resident prior past medical history of stroke with right-sided weakness seizures, diabetes, reflux, baseline status of debility/wheelchair bound. Patient was initially admitted to the ICU and has been hospitalized since August 15. Initially for respiratory failure from aspiration pneumonia and colonic ileus. Patient was also intubated during early part of this hospitalization. He has been on broad-spectrum antibiotics. He has been followed by gastroenterology and general surgery. The patient underwent flexible sigmoidoscopy and has been regularly followed by speech and swallow therapy.   Assessment: Mr. Zaino is resting in bed, he briefly awakens to answer questions and goes back to sleep.   Recommendations/Plan:  Continue transition off TPN.   Dr. Posey Pronto has been working to wean Depakote.  Mr. Cardell has been started on Megace and Zyprexa with hope it will stimulates appetite and encourage him to be more awake if sleepiness is due to hypoactive delirium.   Reviewed MOST with son, Marden Noble, and recommended completion of MOST form prior to discharge.       Goals of Care and Additional Recommendations:  Full code at this time.   Code Status:    Code Status Orders        Start     Ordered   02/22/17 0651  Full code  Continuous     02/22/17 0651    Code Status History    Date Active Date Inactive Code Status Order ID Comments User Context   This patient has a current code status but no historical code status.       Prognosis:   Unable to  determine   Discharge Planning:  SNF rehab with palliative services on d/c.    Thank you for allowing the Palliative Medicine Team to assist in the care of this patient.   Time In: 1150 Time Out: 1230 Total Time 40 Prolonged Time Billed No      Greater than 50%  of this time was spent counseling and coordinating care related to the above assessment and plan.  Please contact Palliative Medicine Team phone at  [phone] for questions and concerns.   Micheline Rough, MD [place] Team [phone]

## 2017-03-12 NOTE — Progress Notes (Signed)
Encouraged PO intake during shift. While administering medications and assisting patient with eating, observed that patient seemed to be having trouble with thin liquids. He did well with thick consistency food, such as his grits at breakfast. Dr. Posey Pronto was notified, awaiting his response. He will contact RN, Katharine Look.

## 2017-03-13 LAB — GLUCOSE, CAPILLARY
GLUCOSE-CAPILLARY: 85 mg/dL (ref 65–99)
Glucose-Capillary: 77 mg/dL (ref 65–99)
Glucose-Capillary: 79 mg/dL (ref 65–99)
Glucose-Capillary: 98 mg/dL (ref 65–99)
Glucose-Capillary: 71 mg/dL (ref 65–99)

## 2017-03-13 LAB — COMPREHENSIVE METABOLIC PANEL
ALK PHOS: 45 U/L (ref 38–126)
ALT: 10 U/L — ABNORMAL LOW (ref 17–63)
ANION GAP: 8 (ref 5–15)
AST: 18 U/L (ref 15–41)
Albumin: 2.7 g/dL — ABNORMAL LOW (ref 3.5–5.0)
BUN: 15 mg/dL (ref 6–20)
CALCIUM: 8.5 mg/dL — AB (ref 8.9–10.3)
CHLORIDE: 108 mmol/L (ref 101–111)
CO2: 26 mmol/L (ref 22–32)
Creatinine, Ser: 1.14 mg/dL (ref 0.61–1.24)
GFR calc non Af Amer: 58 mL/min — ABNORMAL LOW (ref >60)
Glucose, Bld: 87 mg/dL (ref 65–99)
Potassium: 3.9 mmol/L (ref 3.5–5.1)
Sodium: 142 mmol/L (ref 135–145)
Total Bilirubin: 0.3 mg/dL (ref 0.3–1.2)
Total Protein: 7 g/dL (ref 6.5–8.1)

## 2017-03-13 LAB — TRIGLYCERIDES: TRIGLYCERIDES: 142 mg/dL (ref <150)

## 2017-03-13 LAB — PHOSPHORUS: PHOSPHORUS: 3.7 mg/dL (ref 2.5–4.6)

## 2017-03-13 LAB — PREALBUMIN: Prealbumin: 21.3 mg/dL (ref 18–38)

## 2017-03-13 LAB — MAGNESIUM: MAGNESIUM: 2 mg/dL (ref 1.7–2.4)

## 2017-03-13 NOTE — Progress Notes (Signed)
[facility] Progress Note  Patient: Derek Crawford HAL:[id number]   PCP: No primary care provider on file. DOB: 23-Aug-1932   DOA: 02/22/2017   DOS: 03/13/2017   Date of Service: the patient was seen and examined on 03/13/2017  Subjective: feeling better, no acute complain, oral intake improving, not at goal yet.   Brief hospital course: 83 year old chronically ill male from a nursing home with CVA and right hemiparesis, seizures,  diabetes, GERD, debility/wheelchair-bound was admitted to the ICU on 8/15 due to respiratory failure from aspiration pneumonia and colonic ileus. CT on admission showed diffuse ileus, suspected cholelithiasis, patchy bilateral airspace opacities was admitted to the ICU, intubated, treated with IV cefepime, also followed by GI and general surgery, treated with NG decompression as well as a rectal tube. Completed 8 days of cefepime on 8/21 -Transferred to [facility] service  8/22, failed swallow eval-high aspiration risk 8/22 with worsening ileus s/p Flex sig on 8/23 8/24: failed swallow eval again, Palliative recommended, Gi signed off and recommended comfort care/palliative 8/26: Palliative meeting: remains Full Code, plan SNF with palliative care when tolerating PO 8/27 completed MBS, moderate oral and mild pharyngeal dysphagia-ordered liquid diet/Po intake Currently further plan is continue to wean TPN and advance diet.  Assessment and Plan: 1. Acute hypoxic respiratory failure Sepsis, aspiration pneumonia. Protein calorie malnutrition in the setting of acute illness.  -Due to sepsis/aspiration pneumonia possibly related to ileus and dysphagia -s/p VDRF -completed course of IV cefepime on 8/21 -s/p repeat SLP evaluation 8/24->NPO /high aspiration risk and palliative care meeting recommended -Pt more alert 8/26 onwards hence SLP re-assessment requested, MBS completed: remains mod aspiration risk but started on Full liquids - Stop TPN. Continue full liquid. Adding  Megace as well as Zyprexa improved appetite. Continue to wean Depakote. - Reconsult speech therapy for final recommendation on discharge for diet. - calorie count shows significant poor oral intake, although getting better and prealbumin also getting better.   PEG tube not possible and not recommended by gastroenterology. TPN for long term no indicated as well.  Discuss with his son on phone, wants to continue current care, he said that his father did a POA paper work and wanted to continue aggressive care and full code.  Informed SON that pt remains high risk for aspiration and if he has a serious life event in next few months, pt does not have enough reserve to survive that event.   2. Colonic ileus - treated conservatively with NG decompression and a rectal tube -Followed by general surgery in ICU -was initially improving and NG tube out and rectal tube removed on 8/21 -then worsened again, s/p flex sig by GI which ruled out obstruction but continues to have ongoing intermittent ileus -severe dysphagia also another challenging issue at this time, per SLP eval in 2017 mild dysphagia then, but now severe and very high aspiration risk -unable to place PEG due to advance age/decreased cognition and anatomically most of stomach is in the chest, GI also recommends palliative care/comfort care. -Palliative care following Continue full liquid diet. QTC is 380. Started back on Reglan, I would change it to scheduled Zofran for 2 days and then stop.  3. History of CVAs/right hemiparesis  -resume ASA/statin   4. Mood disorder. Remote history of seizure. Based on my conversation with patient's RN at the nursing home this is primarily used for mood stabilizer rather than history of seizures. Depakote was slowly weaned off and currently stopped. Placing the patient on Zyprexa for hypoactive delirium.  5. Metabolic encephalopathy  -secondary to #1 and 2  As well as a combination of  polypharmacy. Minimize all psychotropic medications including Depakote  6. Diabetes mellitus ,  -lantus on low-dose, SSI  7. Dysphagia Protein calorie malnutrition -multifactorial, see above -Suspect mild compensated dysphagia related to previous stroke however exacerbated significantly in the setting of critical illness, long ICU stay and mechanical ventilation,  -s/p SLP re-eval again, high aspiration risk, palliative consult recommended, see above - Oral appetite is improving on full liquid diet with reduction in psychotropic medications. Will discuss with speech therapy tomorrow regarding final recommendation.  Diet: Full liquid diet DVT Prophylaxis: subcutaneous Heparin  Advance goals of care discussion: full code  Family Communication: family was present at bedside, at the time of interview.   Disposition:  Discharge to SNF tomorrow.  Consultants: gastroenterology, CCS, CCM primary admission, palliative care  Antibiotics: Anti-infectives    Start     Dose/Rate Route Frequency Ordered Stop   02/25/17 2000  ceFEPIme (MAXIPIME) 1 g in dextrose 5 % 50 mL IVPB     1 g 100 mL/hr over 30 Minutes Intravenous Every 8 hours 02/25/17 1252 02/28/17 2136   02/24/17 0200  vancomycin (VANCOCIN) IVPB 1000 mg/200 mL premix  Status:  Discontinued     1,000 mg 200 mL/hr over 60 Minutes Intravenous Every 48 hours 02/22/17 1051 02/23/17 0848   02/22/17 1200  vancomycin (VANCOCIN) 50 mg/mL oral solution 500 mg  Status:  Discontinued     500 mg Oral Every 6 hours 02/22/17 1018 02/22/17 1019   02/22/17 1200  vancomycin (VANCOCIN) 50 mg/mL oral solution 500 mg  Status:  Discontinued     500 mg Per Tube Every 6 hours 02/22/17 1019 02/22/17 2227   02/22/17 1200  ceFEPIme (MAXIPIME) 2 g in dextrose 5 % 50 mL IVPB  Status:  Discontinued     2 g 100 mL/hr over 30 Minutes Intravenous Every 24 hours 02/22/17 1052 02/25/17 1253   02/22/17 1030  metroNIDAZOLE (FLAGYL) IVPB 500 mg  Status:   Discontinued     500 mg 100 mL/hr over 60 Minutes Intravenous Every 8 hours 02/22/17 1014 02/23/17 0848   02/22/17 0300  vancomycin (VANCOCIN) IVPB 1000 mg/200 mL premix     1,000 mg 200 mL/hr over 60 Minutes Intravenous  Once 02/22/17 0257 02/22/17 0412   02/22/17 0300  piperacillin-tazobactam (ZOSYN) IVPB 3.375 g     3.375 g 100 mL/hr over 30 Minutes Intravenous  Once 02/22/17 0257 02/22/17 0345     Objective: Physical Exam: Vitals:   03/12/17 2036 03/12/17 2121 03/13/17 0509 03/13/17 1404  BP: (!) 96/50 (!) 104/53 (!) 106/49 (!) 115/42  Pulse: 61  68   Resp: 20  20 20   Temp: 98.8 F (37.1 C)  99.1 F (37.3 C)   TempSrc: Oral  Oral Oral  SpO2: 98%  99% 98%  Weight:   73.9 kg (162 lb 14.7 oz)   Height:        Intake/Output Summary (Last 24 hours) at 03/13/17 1550 Last data filed at 03/13/17 1406  Gross per 24 hour  Intake              480 ml  Output              750 ml  Net             -270 ml   Filed Weights   03/11/17 0434 03/12/17 0459 03/13/17 0509  Weight: 87 kg (191 lb  12.8 oz) 87.3 kg (192 lb 7.4 oz) 73.9 kg (162 lb 14.7 oz)   General: Alert, Awake and Oriented to Time, Place and Person. Appear in mild distress, affect appropriate Eyes: PERRL, Conjunctiva normal ENT: Oral Mucosa clear moist. Neck: difficult to assess JVD, no Abnormal Mass Or lumps Cardiovascular: S1 and S2 Present, no Murmur, Peripheral Pulses Present Respiratory: normal respiratory effort, Bilateral Air entry equal and Decreased, no use of accessory muscle, basal Crackles, no wheezes Abdomen: Bowel Sound present, Soft and no tenderness, no hernia Skin: no redness, no Rash, no induration Extremities: trace Pedal edema, no calf tenderness Neurologic: Grossly no focal neuro deficit. Bilaterally Equal motor strength  Data Reviewed: CBC:  Recent Labs Lab 03/09/17 0458 03/10/17 0807 03/11/17 0519 03/12/17 0437  WBC 9.0 7.6 6.3 5.7  NEUTROABS 5.7  --   --   --   HGB 10.6* 9.9* 10.7* 9.8*   HCT 32.6* 30.8* 33.4* 30.1*  MCV 80.9 81.3 81.7 80.3  PLT 233 242 257 564   Basic Metabolic Panel:  Recent Labs Lab 03/07/17 0545 03/08/17 0502 03/09/17 0458 03/10/17 0807 03/11/17 0519 03/12/17 0437 03/13/17 0502  NA 139 137 137 139 138 142 142  K 3.4* 3.7 3.8 4.1 3.9 3.5 3.9  CL 102 101 103 105 108 110 108  CO2 32 29 27 28 24 24 26   GLUCOSE 166* 202* 176* 124* 123* 92 87  BUN 21* 19 15 17 17 14 15   CREATININE 0.91 0.86 0.83 1.05 0.98 0.99 1.14  CALCIUM 8.3* 8.6* 8.6* 8.5* 8.3* 8.5* 8.5*  MG 1.7 1.7 1.8  --   --   --  2.0  PHOS 3.1 3.0 3.4  --   --   --  3.7   Liver Function Tests:  Recent Labs Lab 03/09/17 0458 03/13/17 0502  AST 18 18  ALT 13* 10*  ALKPHOS 46 45  BILITOT 0.3 0.3  PROT 6.8 7.0  ALBUMIN 2.6* 2.7*   No results for input(s): LIPASE, AMYLASE in the last 168 hours.  Recent Labs Lab 03/08/17 1220  AMMONIA 34   Coagulation Profile: No results for input(s): INR, PROTIME in the last 168 hours. Cardiac Enzymes: No results for input(s): CKTOTAL, CKMB, CKMBINDEX, TROPONINI in the last 168 hours. BNP (last 3 results) No results for input(s): PROBNP in the last 8760 hours. CBG:  Recent Labs Lab 03/12/17 2033 03/12/17 2333 03/13/17 0327 03/13/17 0759 03/13/17 1130  GLUCAP 106* 73 77 85 79   Studies: No results found.  Scheduled Meds: . aspirin  81 mg Oral Daily  . atorvastatin  10 mg Oral q1800  . chlorhexidine  15 mL Mouth Rinse BID  . enoxaparin (LOVENOX) injection  40 mg Subcutaneous Q24H  . feeding supplement (GLUCERNA SHAKE)  237 mL Oral TID BM  . feeding supplement (PRO-STAT SUGAR FREE 64)  30 mL Oral BID  . insulin aspart  0-15 Units Subcutaneous Q4H  . insulin glargine  5 Units Subcutaneous Daily  . latanoprost  1 drop Both Eyes QHS  . mouth rinse  15 mL Mouth Rinse QID  . megestrol  400 mg Oral Daily  . OLANZapine  5 mg Oral QHS  . ondansetron (ZOFRAN) IV  4 mg Intravenous Q8H   Continuous Infusions:  PRN Meds:  acetaminophen, albuterol, alum & mag hydroxide-simeth, ondansetron (ZOFRAN) IV, RESOURCE THICKENUP CLEAR  Time spent: 35 minutes  Author: Berle Mull, MD [facility] Pager: [phone] 03/13/2017 3:50 PM  If 7PM-7AM, please contact night-coverage at [email or web address], password  [email or web address]

## 2017-03-13 NOTE — Progress Notes (Signed)
CSW contacted patient's son to follow up about bed offers. Patient's son reported that [facility] is his first choice and that [facility] is his second choice. Patient's son confirmed that patient needs a [facility] medicaid bed. CSW agreed to follow up with SNFs and provide patient's son with an update regarding bed availability.   CSW contacted [facility] and spoke with staff member Alyse Low and informed her about patient's interest for a [facility] med[facility] bed. Staff reported that they are able to offer patient a bed and that his patient monthly liability is 731.00 that is due at arrival. CSW agreed to keep SNF updated on when patient is medically stable to discharge.  CSW updated patient's son, who confirmed that he wants patient to go to [facility]. Patient's son reported that he would check with patient's current SNF about turning over social security check to new facility to cover the patient monthly liability.   CSW continuing to follow to assist with discharge planning.  Abundio Miu, [place] Social Worker [facility] Cell#: [phone]

## 2017-03-13 NOTE — Progress Notes (Addendum)
Order for new evaluation received as RN noted pt having difficulty with thinner liquids.  SLP reviewed MBS from this admission which showed delay in oral transiting up to 7 seconds, therefore agree with modifying diet to nectar liquids to decrease velocity flow of liquids and subsequently hopefully aspiration risk.  Please note, son was present during [place] and agreed to po diet with mitigation strategies.    Please note, pt with poor intake during entire hospital stay - hopefully diet modification will not decrease intake further - recommend monitor closely.  Will follow up next date for evaluation. Thanks. Luanna Salk, [facility] [facility] SLP [phone]

## 2017-03-14 LAB — BASIC METABOLIC PANEL
ANION GAP: 6 (ref 5–15)
BUN: 17 mg/dL (ref 6–20)
CALCIUM: 8.3 mg/dL — AB (ref 8.9–10.3)
CO2: 25 mmol/L (ref 22–32)
CREATININE: 1.2 mg/dL (ref 0.61–1.24)
Chloride: 109 mmol/L (ref 101–111)
GFR calc Af Amer: >60 mL/min (ref >60)
GFR calc non Af Amer: 54 mL/min — ABNORMAL LOW (ref >60)
GLUCOSE: 87 mg/dL (ref 65–99)
Potassium: 3.8 mmol/L (ref 3.5–5.1)
Sodium: 140 mmol/L (ref 135–145)

## 2017-03-14 LAB — GLUCOSE, CAPILLARY
GLUCOSE-CAPILLARY: 80 mg/dL (ref 65–99)
GLUCOSE-CAPILLARY: 196 mg/dL — AB (ref 65–99)
Glucose-Capillary: 79 mg/dL (ref 65–99)
Glucose-Capillary: 83 mg/dL (ref 65–99)

## 2017-03-14 MED ORDER — FUROSEMIDE 20 MG PO TABS: 20.0000 mg | ORAL_TABLET | Freq: Every day | ORAL | 0 refills | Status: AC | PRN

## 2017-03-14 MED ORDER — ENSURE PO LIQD: 237.0000 mL | Freq: Three times a day (TID) | ORAL | 12 refills | Status: AC

## 2017-03-14 MED ORDER — OLANZAPINE 5 MG PO TABS: 5.0000 mg | ORAL_TABLET | Freq: Every day | ORAL | 0 refills | Status: AC

## 2017-03-14 MED ORDER — SIMETHICONE 80 MG PO CHEW: 80.0000 mg | CHEWABLE_TABLET | Freq: Four times a day (QID) | ORAL | 0 refills | Status: AC

## 2017-03-14 MED ORDER — MEGESTROL ACETATE 400 MG/10ML PO SUSP: 400.0000 mg | Freq: Every day | ORAL | 0 refills | Status: AC

## 2017-03-14 MED ORDER — PRO-STAT SUGAR FREE PO LIQD: 30.0000 mL | Freq: Three times a day (TID) | ORAL | 0 refills | Status: AC

## 2017-03-14 MED ORDER — POLYETHYLENE GLYCOL 3350 17 G PO PACK: 17.0000 g | PACK | Freq: Every day | ORAL | 0 refills | Status: AC | PRN

## 2017-03-14 MED ORDER — SODIUM CHLORIDE 0.9 % IV BOLUS (SEPSIS)
500.0000 mL | Freq: Once | INTRAVENOUS | Status: AC
  Administered 2017-03-14: 500 mL via INTRAVENOUS

## 2017-03-14 MED ORDER — RESOURCE THICKENUP CLEAR PO POWD: 1.0000 g | ORAL | 0 refills | Status: AC | PRN

## 2017-03-14 NOTE — Discharge Summary (Signed)
[facility] Discharge Summary   Patient: Derek Crawford CZY:[id number]   PCP: No primary care provider on file. DOB: 05-Mar-1933   Date of admission: 02/22/2017   Date of discharge:  03/14/2017    Discharge Diagnoses:  Active Problems:   Respiratory failure ([place])   Ileus ([place])   Encounter for central line placement   Abdominal distention   Sepsis ([place])   Dysphagia   Encounter for palliative care   Goals of care, counseling/discussion   Admitted From: SNF Disposition:  SNF  Recommendations for Outpatient Follow-up:  1. Please follow up with PCP, establish care with palliative care and speech therapy  2. Make sure that the patient does not get constipated   Contact information for follow-up providers    PCP. Schedule an appointment as soon as possible for a visit in 1 week(s).        palliative care. Schedule an appointment as soon as possible for a visit in 1 week(s).   Why:  continue goals of care discussion.        speech therapy. Schedule an appointment as soon as possible for a visit.   Why:  at SNF           Contact information for after-discharge care    Destination    [facility] Follow up.   Specialty:  [place] information: [address] [place] [phone]                 Diet recommendation: full liquid diet  Activity: The patient is advised to gradually reintroduce usual activities.  Discharge Condition: good  Code Status: full code  History of present illness: As per the H and P dictated on admission, "83 y.o. 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  Required intubation after arrival to ICU. Surgery consulted for ileus."  Hospital Course:  83 year old chronically ill male from a nursing home with CVA and right hemiparesis, seizures, diabetes, GERD, debility/wheelchair-bound was admitted to  the ICU on 8/15 due to respiratory failure from aspiration pneumonia and colonic ileus. CT on admission showed diffuse ileus, suspected cholelithiasis, patchy bilateral airspace opacities was admitted to the ICU, intubated, treated with IV cefepime, also followed by GI and general surgery, treated with NG decompression as well as a rectal tube. Completed 8 days of cefepime on 8/21 -Transferred to [facility] service 8/22, failed swallow eval-high aspiration risk 8/22 with worsening ileus s/p Flex sig on 8/23 8/24: failed swallow eval again, Palliative recommended, Gi signed off and recommended comfort care/palliative 8/26: Palliative meeting: remains Full Code, plan SNF with palliative care when tolerating PO 8/27 completed MBS, moderate oral and mild pharyngeal dysphagia-ordered liquid diet/Po intake. Calorie count was showing improvement with discontinuation of Depakote as that lead to improve mentation for the patient. Improvement in prealbumin, oral intake as well as mentation with continued care.  Summary of his active problems in the hospital is as following. 1. Acute hypoxic respiratory failure Sepsis, aspiration pneumonia. Protein calorie malnutrition in the setting of acute illness.  -Due to sepsis/aspiration pneumonia possibly related to ileus and dysphagia -s/p VDRF -completed course of IV cefepime on 8/21 -s/p repeat SLP evaluation 8/24->NPO /high aspiration risk and palliative care meeting recommended -Pt more alert 8/26 onwards hence SLP re-assessment requested, MBS completed: remains mod aspiration risk but started on Full liquids - Stop TPN. Continue full liquid. Adding Megace as well  as Zyprexa improved appetite.  - calorie count shows significant poor oral intake, although getting better and prealbumin also getting better.   PEG tube not possible and not recommended by gastroenterology. TPN for long term no indicated as well.  Discuss with his son on phone, wants to continue  current care, he said that his father did a POA paper work and wanted to continue aggressive care and full code.  Informed SON that pt remains high risk for aspiration and if he has a serious life event in next few months, pt does not have enough reserve to survive that event.   2. Colonic ileus Chronic severe constipation  - treated conservatively with NG decompression and a rectal tube -Followed by general surgery in ICU -was initially improving and NG tube out and rectal tube removed on 8/21 -then worsened again, s/p flex sig by GI which ruled out obstruction but continues to have ongoing intermittent ileus -severe dysphagia also another challenging issue at this time, per SLP eval in 2017 mild dysphagia then, but now severe and very high aspiration risk -unable to place PEG due to 83/decreased cognition and anatomically most of stomach is in the chest, GI also recommends palliative care/comfort care. -Palliative care following Continue full liquid diet. Also continue stool softener and simethicone.  3. History of CVAs/right hemiparesis -resume ASA/statin   4. Mood disorder. Remote history of seizure. Based on my conversation with patient's RN at the nursing home this is primarily used for mood stabilizer rather than history of seizures. Depakote was slowly weaned off and currently stopped. Placing the patient on Zyprexa for hypoactive delirium.  5. Metabolic encephalopathy  -secondary to #1 and 2  As well as a combination of polypharmacy. Minimize all psychotropic medications including Depakote  6. Diabetes mellitus ,  -lantus on low-dose, SSI  7. Dysphagia Protein calorie malnutrition -multifactorial, see above -Suspect mild compensated dysphagia related to previous stroke however exacerbated significantly in the setting of critical illness, long ICU stay and mechanical ventilation,  -s/p SLP re-eval again, high aspiration risk, palliative consult recommended,  see above - Oral appetite is improving on full liquid diet with reduction in psychotropic medications.   All other chronic medical condition were stable during the hospitalization.  Patient was seen by physical therapy, who recommended SNF, which was arranged by Education officer, museum and case Freight forwarder. On the day of the discharge the patient's vitals were stable, and no other acute medical condition were reported by patient. the patient was felt safe to be discharge at SNF with therapy.  Procedures and Results:  Central line insertion  Intubation  Flexible sigmoidoscopy - There are no distal obstructing lesions. There was somewhat more air in the colon than is usual, I suctioned this clear. - He passed gas, moblized the air, while on left side during this examination.   Echocardiogram Study Conclusions  - Left ventricle: The cavity size was normal. Wall thickness was   increased in a pattern of moderate LVH. Systolic function was   normal. The estimated ejection fraction was in the range of 55%   to 60%. Basal to mid inferior hypokinesis. Doppler parameters are   consistent with abnormal left ventricular relaxation (grade 1   diastolic dysfunction). - Aortic valve: There was no stenosis. - Aorta: The aorta was poorly visualized. - Mitral valve: There was no significant regurgitation. - Right ventricle: Poorly visualized. The cavity size was normal.   Systolic function was normal. - Tricuspid valve: Peak RV-RA gradient (S): 25  mm Hg. - Systemic veins: IVC was not visualized.   Modified barium swallow  Consultations:  Primary admission with critical care medicine  General surgery  Gastroenterology, [place]  Palliative care  Speech therapy  DISCHARGE MEDICATION: Current Discharge Medication List    START taking these medications   Details  ENSURE (ENSURE) Take 237 mLs by mouth 3 (three) times daily between meals. Qty: 237 mL, Refills: 12    Maltodextrin-Xanthan Gum  (RESOURCE THICKENUP CLEAR) POWD Take 1 g by mouth as needed. Qty: 1 Can, Refills: 0    megestrol (MEGACE) 400 MG/10ML suspension Take 10 mLs (400 mg total) by mouth daily. Qty: 240 mL, Refills: 0    OLANZapine (ZYPREXA) 5 MG tablet Take 1 tablet (5 mg total) by mouth at bedtime. Qty: 30 tablet, Refills: 0    polyethylene glycol (MIRALAX) packet Take 17 g by mouth daily as needed. Qty: 14 each, Refills: 0    simethicone (MYLICON) 80 MG chewable tablet Chew 1 tablet (80 mg total) by mouth 4 (four) times daily. Qty: 30 tablet, Refills: 0      CONTINUE these medications which have CHANGED   Details  Amino Acids-Protein Hydrolys (FEEDING SUPPLEMENT, PRO-STAT SUGAR FREE 64,) LIQD Take 30 mLs by mouth 3 (three) times daily with meals. Qty: 900 mL, Refills: 0    furosemide (LASIX) 20 MG tablet Take 1 tablet (20 mg total) by mouth daily as needed. Qty: 30 tablet, Refills: 0      CONTINUE these medications which have NOT CHANGED   Details  aspirin 81 MG tablet Take 81 mg by mouth daily.    atorvastatin (LIPITOR) 10 MG tablet Take 10 mg by mouth daily. Take one tablet at bedtime for hyperlipidemia    bisacodyl (DULCOLAX) 10 MG suppository Place 10 mg rectally as needed for moderate constipation.    fluticasone (FLONASE) 50 MCG/ACT nasal spray Place 1 spray into the nose 2 (two) times daily.     latanoprost (XALATAN) 0.005 % ophthalmic solution Place 1 drop into both eyes at bedtime.    lubiprostone (AMITIZA) 24 MCG capsule Take 24 mcg by mouth 2 (two) times daily with a meal.    Multiple Vitamins-Minerals (CERTAGEN SILVER PO) Take by mouth. Take one tablet daily    vitamin B-12 (CYANOCOBALAMIN) 1000 MCG tablet Take 1,000 mcg by mouth daily.    Vitamin D, Ergocalciferol, (DRISDOL) 50000 units CAPS capsule Take 50,000 Units by mouth every 30 (thirty) days.      STOP taking these medications     amLODipine (NORVASC) 2.5 MG tablet      divalproex (DEPAKOTE) 250 MG DR tablet       insulin glargine (LANTUS) 100 UNIT/ML injection      lisinopril (PRINIVIL,ZESTRIL) 5 MG tablet      tamsulosin (FLOMAX) 0.4 MG CAPS capsule      traZODone (DESYREL) 50 MG tablet        No Known Allergies Discharge Instructions    Diet - low sodium heart healthy    Complete by:  As directed    nectar thick   Diet full liquid    Complete by:  As directed    nectar thick.   Discharge instructions    Complete by:  As directed    It is important that you read following instructions as well as go over your medication list with RN to help you understand your care after this hospitalization.  Discharge Instructions: Please follow-up with PCP in one week  Please request your  primary care physician to go over all Hospital Tests and Procedure/Radiological results at the follow up,  Please get all Hospital records sent to your PCP by signing hospital release before you go home.   Do not take more than prescribed Pain, Sleep and Anxiety Medications. You were cared for by a hospitalist during your hospital stay. If you have any questions about your discharge medications or the care you received while you were in the hospital after you are discharged, you can call the unit and ask to speak with the hospitalist on call if the hospitalist that took care of you is not available.  Once you are discharged, your primary care physician will handle any further medical issues. Please note that NO REFILLS for any discharge medications will be authorized once you are discharged, as it is imperative that you return to your primary care physician (or establish a relationship with a primary care physician if you do not have one) for your aftercare needs so that they can reassess your need for medications and monitor your lab values. You Must read complete instructions/literature along with all the possible adverse reactions/side effects for all the Medicines you take and that have been prescribed to you. Take any  new Medicines after you have completely understood and accept all the possible adverse reactions/side effects. Wear Seat belts while driving.   Increase activity slowly    Complete by:  As directed      Discharge Exam: Filed Weights   03/12/17 0459 03/13/17 0509 03/14/17 0623  Weight: 87.3 kg (192 lb 7.4 oz) 73.9 kg (162 lb 14.7 oz) 80.9 kg (178 lb 5.6 oz)   Vitals:   03/13/17 2046 03/14/17 0623  BP: (!) 116/46 (!) 99/41  Pulse: 63 61  Resp: 18 20  Temp: 98.8 F (37.1 C) 98.2 F (36.8 C)  SpO2: 97% 94%   General: Appear in mild distress, no Rash; Oral Mucosa moist. Cardiovascular: S1 and S2 Present, no Murmur, no JVD Respiratory: Bilateral Air entry present and Clear to Auscultation, no Crackles, no wheezes Abdomen: Bowel Sound present, Soft and no tenderness Extremities: no Pedal edema, no calf tenderness Neurology: Grossly no focal neuro deficit. Chronic right hemiparalysis unchanged. Chronic dysarthria  The results of significant diagnostics from this hospitalization (including imaging, microbiology, ancillary and laboratory) are listed below for reference.    Significant Diagnostic Studies: Ct Abdomen Pelvis Wo Contrast  Result Date: 02/22/2017 CLINICAL DATA:  Question of aspiration. Fever. Concern for free intra-abdominal air on chest radiograph. Initial encounter. EXAM: CT ABDOMEN AND PELVIS WITHOUT CONTRAST TECHNIQUE: Multidetector CT imaging of the abdomen and pelvis was performed following the standard protocol without IV contrast. COMPARISON:  None. FINDINGS: Lower chest: Patchy bilateral airspace opacities raise concern for pneumonia. A large hiatal hernia is noted. Scattered coronary artery calcifications are seen. Hepatobiliary: Calcifications at the gallbladder fossa may reflect stones within a decompressed gallbladder. A vague nonspecific 1.6 cm hypodensity is noted at the right hepatic lobe. The common bile duct remains normal in caliber. Pancreas: The pancreas is  within normal limits. Spleen: The spleen is diminutive and grossly unremarkable. Adrenals/Urinary Tract: The adrenal glands are unremarkable in appearance. Nonspecific perinephric stranding is noted bilaterally. Mild bilateral renal atrophy is noted. There is no evidence of hydronephrosis. No renal or ureteral stones are identified. Stomach/Bowel: There is marked distention of small and large bowel loops, concerning for diffuse ileus. The air under the diaphragm on radiograph reflects markedly distended bowel loops. No free intra-abdominal air is seen.  The stomach is relatively decompressed and otherwise unremarkable. The appendix is not visualized; there is no evidence for appendicitis. Vascular/Lymphatic: Scattered calcification is seen along the abdominal aorta and its branches. The abdominal aorta is otherwise grossly unremarkable. The inferior vena cava is grossly unremarkable. No retroperitoneal lymphadenopathy is seen. No pelvic sidewall lymphadenopathy is identified. Reproductive: The bladder is decompressed, with a Foley catheter in place. The prostate is normal in size. Other: No significant soft tissue abnormalities are seen. Musculoskeletal: No acute osseous abnormalities are identified. Vacuum phenomenon is noted at the lower lumbar spine. The visualized musculature is unremarkable in appearance. IMPRESSION: 1. No free intra-abdominal air seen. 2. Marked distention of small and large bowel loops, concerning for diffuse ileus. 3. Patchy bilateral airspace opacities raise concern for pneumonia. 4. Suspect cholelithiasis. 5. Large hiatal hernia noted. 6. Scattered coronary artery calcifications. 7. Vague nonspecific 1.6 mm hypodensity at the right hepatic lobe. 8. Mild bilateral renal atrophy noted. 9. Scattered aortic atherosclerosis. Electronically Signed   By: Garald Balding M.D.   On: 02/22/2017 04:03   Dg Chest 2 View  Result Date: 02/22/2017 CLINICAL DATA:  Acute onset of congestion and fever.  Question of aspiration. Initial encounter. EXAM: CHEST  2 VIEW COMPARISON:  None. FINDINGS: The lungs are hypoexpanded. Vascular crowding and vascular congestion are noted. Increased interstitial markings may reflect pneumonia or possibly interstitial edema. There is no evidence of pleural effusion or pneumothorax. The heart is borderline normal in size. No acute osseous abnormalities are seen. The lateral view demonstrates only diffusely distended bowel loops, likely reflecting ileus. Free intra-abdominal air is a concern, given extensive air under the right hemidiaphragm. IMPRESSION: 1. Vascular congestion noted. Increased interstitial markings may reflect pneumonia or possibly interstitial edema. Lungs hypoexpanded. 2. Diffusely distended bowel loops likely reflects ileus. Suspect free intra-abdominal air. CT of the abdomen and pelvis would be helpful for further evaluation, when and as deemed clinically appropriate. Critical Value/emergent results were called by telephone at the time of interpretation on 02/22/2017 at 2:51 am to Dr. Veatrice Kells, who verbally acknowledged these results. Electronically Signed   By: Garald Balding M.D.   On: 02/22/2017 02:52   Dg Abd 1 View  Result Date: 02/28/2017 CLINICAL DATA:  Follow-up ileus EXAM: ABDOMEN - 1 VIEW COMPARISON:  Supine abdominal radiograph dated February 27, 2017 FINDINGS: There remain loops of mildly distended gas-filled small and large bowel. The degree of large bowel distention has decreased somewhat. No free extraluminal gas collections are observed. There is no significant rectal gas. There degenerative changes of the lower lumbar spine. IMPRESSION: Decreased distention of large bowel in the right lower quadrant of the abdomen may reflect a resolving sigmoid volvulus. Persistent gaseous distention of small-bowel loops consistent with ileus. Electronically Signed   By: David  Martinique M.D.   On: 02/28/2017 07:06   Dg Abd 1 View  Result Date:  02/22/2017 CLINICAL DATA:  OG tube placement. EXAM: ABDOMEN - 1 VIEW COMPARISON:  CT abdomen pelvis from same day. FINDINGS: Enteric tube with tip in the mid to distal esophagus. Diffusely dilated loops of bowel, unchanged. IMPRESSION: Enteric tube with tip in the mid to distal esophagus. Recommend advancement. Electronically Signed   By: Titus Dubin M.D.   On: 02/22/2017 10:45   Dg Chest Port 1 View  Result Date: 02/27/2017 CLINICAL DATA:  Acute respiratory failure . EXAM: PORTABLE CHEST 1 VIEW COMPARISON:  Chest x-ray 02/26/2017 . FINDINGS: NG tube again noted, its tip appears to be at the gastroesophageal  junction. Further interim advancement should be considered. Endotracheal tube and left IJ line stable position. Heart size stable . Persistent unchanged atelectasis/ infiltrates. Tiny bilateral pleural effusions cannot be excluded. No pneumothorax. Heart size stable. No interim change. IMPRESSION: 1. NG tube tip previously noted in the mid esophagus has been advanced. On today's exam the NG tube tip is at the level of the gastroesophageal junction. Further interim advancement should be considered. 2. Persistent unchanged bibasilar atelectasis/infiltrates. Tiny bilateral pleural effusions cannot be excluded . These results will be called to the ordering clinician or representative by the Radiologist Assistant, and communication documented in the PACS or zVision Dashboard. Electronically Signed   By: Marcello Moores  Register   On: 02/27/2017 07:06   Dg Chest Port 1 View  Result Date: 02/26/2017 CLINICAL DATA:  Acute respiratory failure. EXAM: PORTABLE CHEST 1 VIEW COMPARISON:  February 25, 2017 FINDINGS: The ETT terminates 4 cm below the thoracic inlet. The NG tube terminates in the mid esophagus. Placement of the NG tube has been stable since February 23, 2017. No pneumothorax. Bibasilar opacities are stable. Probable small effusion on the right. No change in the cardiomediastinal silhouette. The left central  line is stable. IMPRESSION: 1. The NG tube continues to terminate in the midesophagus. The patient may benefit from advancement. Recommend clinical correlation. 2. Other support apparatus is stable. 3. Bibasilar opacities are similar in the interval. Small right effusion. Electronically Signed   By: Dorise Bullion III M.D   On: 02/26/2017 07:13   Dg Chest Port 1 View  Result Date: 02/25/2017 CLINICAL DATA:  Acute respiratory failure EXAM: PORTABLE CHEST 1 VIEW COMPARISON:  Yesterday FINDINGS: Endotracheal tube tip at the clavicular heads. Trachea is deviated chronically, likely due to the aorta. Left IJ central line with tip at the upper SVC. There is a NG tube which could not be advanced through the hiatal hernia. The tube is shorter than on 02/22/2017 fluoroscopy. Bibasal streaky opacity. Low lung volumes. Stable heart size. No effusion or pneumothorax. Known gaseous distention of colon. IMPRESSION: 1. Unchanged positioning of tubes and central line. Advancement of the NG tube by 5 cm would place the tip closer to the hiatal hernia. 2. Unchanged atelectasis or pneumonia at the bases. Electronically Signed   By: Monte Fantasia M.D.   On: 02/25/2017 07:05   Dg Chest Port 1 View  Result Date: 02/24/2017 CLINICAL DATA:  Respiratory failure. EXAM: PORTABLE CHEST 1 VIEW COMPARISON:  Chest x-ray and abdominal series 816 2018. FINDINGS: Endotracheal tube, NG tube, left IJ line stable position. Heart size normal. Bibasilar atelectasis. No pleural effusion or pneumothorax. Distended loops of bowel again noted IMPRESSION: 1.  Lines and tubes in stable position. 2.  Bibasilar atelectasis and/or infiltrates. 3.  Bowel distention again noted. Electronically Signed   By: Marcello Moores  Register   On: 02/24/2017 06:56   Dg Chest Port 1 View  Result Date: 02/23/2017 CLINICAL DATA:  Respiratory failure EXAM: PORTABLE CHEST 1 VIEW COMPARISON:  02/22/2017 FINDINGS: Cardiac shadow is stable. Endotracheal tube, nasogastric  catheter and left jugular central line are noted. The nasogastric catheter lies in the distal esophagus and could be advanced further into the stomach. Bibasilar atelectatic changes are noted. IMPRESSION: Nasogastric catheter in the distal esophagus. This should be advanced. Stable bibasilar atelectasis. Electronically Signed   By: Inez Catalina M.D.   On: 02/23/2017 06:58   Dg Chest Port 1 View  Result Date: 02/22/2017 CLINICAL DATA:  Repositioning of the endotracheal tube with placement of the  left internal jugular venous catheter. EXAM: PORTABLE CHEST 1 VIEW COMPARISON:  Earlier portable chest x-ray revealing the endotracheal tube at the carina. FINDINGS: The endotracheal tube has been withdrawn and now lies approximately 4 cm above the carina. The left internal jugular venous catheter tip projects over the distal third of the SVC. No postprocedure pneumothorax is observed. The esophagogastric tube has been withdrawn. There is persistent bibasilar interstitial density. The cardiac silhouette is mildly enlarged. There is considerable gaseous distention of bowel under the hemidiaphragms. I cannot exclude free extraluminal gas either. IMPRESSION: Appropriate repositioning of the endotracheal tube. No postprocedure complication following the placement of the left internal jugular venous catheter. Significant gaseous distention of bowel in the upper abdomen. I cannot exclude free extraluminal gas in the upper abdomen. Reportedly the patient has not undergone recent abdominal surgery. A three-way abdominal series or abdominal CT scanning would be useful in an effort to detect any free extraluminal gas in the abdomen which might indicate a perforated viscus. The findings were called by me to the patient's nurse, Mendel Corning, R.N. at 12:55 p.m. on 22 February 2017. She will communicate the findings to patient's team. Electronically Signed   By: David  Martinique M.D.   On: 02/22/2017 12:57   Dg Chest Port 1 View  Result  Date: 02/22/2017 CLINICAL DATA:  Respiratory failure, intubated patient. Status post CVA. EXAM: PORTABLE CHEST 1 VIEW COMPARISON:  Chest x-ray of February 22, 2017 FINDINGS: The lungs are reasonably well inflated. The interstitial markings remain increased but have improved. Confluent densities at both bases are present but also improved. The cardiac silhouette is mildly enlarged. The endotracheal tube tip is at the carina. The esophagogastric tube tip in proximal port lie in the midesophagus. There is marked gaseous distention of the bowel. IMPRESSION: Improved appearance of the pulmonary interstitium bilaterally suggests resolving interstitial edema or less likely pneumonia. The endotracheal tube is at the level of the carina. Withdrawal by 3 cm is recommended. The esophagogastric tube tip in proximal port lie in the midesophagus. Advancement by approximately 20 cm is recommended. There is considerable gaseous distention of bowel in the upper abdomen. Critical Value/emergent results were called by telephone at the time of interpretation on 02/22/2017 at 10:55 am to Mendel Corning, RN,, who verbally acknowledged these results. Electronically Signed   By: David  Martinique M.D.   On: 02/22/2017 10:57   Dg Abd Portable 1v  Result Date: 02/27/2017 CLINICAL DATA:  Distended abdomen.  Ileus. EXAM: PORTABLE ABDOMEN - 1 VIEW COMPARISON:  02/23/2017.  02/22/2017.  CT 02/22/2017. FINDINGS: Persistent distended colon again noted with slight improvement. No prominent small-bowel distention on today's examination . Findings again consistent with adynamic ileus. Distal colonic obstruction including sigmoid volvulus cannot be excluded. No free air identified. Degenerative changes lumbar spine and both hips . IMPRESSION: Persistent distended colon again noted with slight improvement. No prominent small bowel distention on today's exam. Findings consist with adynamic ileus. Distal colonic obstruction including sigmoid volvulus cannot be  excluded . Continued follow-up suggested to demonstrate resolution. Electronically Signed   By: Marcello Moores  Register   On: 02/27/2017 09:30   Dg Abd Portable 1v  Result Date: 02/23/2017 CLINICAL DATA:  Abdominal distension EXAM: PORTABLE ABDOMEN - 1 VIEW COMPARISON:  Abdomen film of 02/22/2017 and CT abdomen pelvis of the same day FINDINGS: There is considerable gaseous distention of large and small bowel. No definite distal colonic bowel gas is seen and a distal colonic lesion cannot be excluded. IMPRESSION: Little change in  diffuse gaseous distention of large and small bowel. Cannot excluded a distal colonic lesion. Electronically Signed   By: Ivar Drape M.D.   On: 02/23/2017 10:08   Dg Addison Bailey G Tube Plc W/fl W/rad  Result Date: 02/22/2017 CLINICAL DATA:  Bowel distension. EXAM: NASO G TUBE PLACEMENT WITH FL AND WITH RAD CONTRAST:  10 cc Omnipaque 300 FLUOROSCOPY TIME:  Fluoroscopy Time: Radiation Exposure Index (if provided by the fluoroscopic device): Number of Acquired Spot Images: 0 COMPARISON:  CT scan and abdominal x-ray from earlier today. FINDINGS: Bedside fluoro evaluation with injection of a small volume water-soluble contrast confirms that is the NG tube is in the large hiatal hernia. Review of the CT scan from earlier today confirms a nearly the entire stomach is in the thorax with only a portion of the gastric antrum below the hemidiaphragm. NG tube could not be manipulated more distally and in fact doing so would likely be counterproductive as the antrum is so collapsed as it passes through the hiatus. IMPRESSION: NG tube tip is positioned in the large hiatal hernia. About 80-90% of the stomach is in the thorax. Electronically Signed   By: Misty Stanley M.D.   On: 02/22/2017 17:05   Dg Swallowing Func-speech Pathology  Result Date: 03/06/2017 Objective Swallowing Evaluation: Type of Study: MBS-Modified Barium Swallow Study Patient Details Name: Malike Foglio MRN: [id number] Date of Birth:  May 14, 1933 Today's Date: 03/06/2017 Time: SLP Start Time (ACUTE ONLY): 1301-SLP Stop Time (ACUTE ONLY): 1330 SLP Time Calculation (min) (ACUTE ONLY): 29 min Past Medical History: Past Medical History: Diagnosis Date . Diabetes mellitus without complication ([place])  . GERD (gastroesophageal reflux disease)  . Glaucoma  . Hemiplegia following CVA (cerebrovascular accident) ([place])   R side weakness . Hyperlipidemia  . Hypertension  . Iron deficiency anemia  . Muscle weakness  . Urinary incontinence  Past Surgical History: Past Surgical History: Procedure Laterality Date . CATARACT EXTRACTION Left  . CIRCUMCISION   . COLONOSCOPY N/A 08/23/2013  Procedure: COLONOSCOPY;  Surgeon: Gatha Mayer, MD;  Location: [facility];  Service: Endoscopy;  Laterality: N/A; . ESOPHAGOGASTRODUODENOSCOPY N/A 08/23/2013  Procedure: ESOPHAGOGASTRODUODENOSCOPY (EGD);  Surgeon: Gatha Mayer, MD;  Location: [facility];  Service: Endoscopy;  Laterality: N/A; . FLEXIBLE SIGMOIDOSCOPY N/A 03/02/2017  Procedure: FLEXIBLE SIGMOIDOSCOPY;  Surgeon: [name], MD;  Location: [facility];  Service: Endoscopy;  Laterality: N/A; . TONSILLECTOMY   HPI: 83 yo M [facility] resident presented with ileus and difficulty of breathing. CT abdomen confirmed ileus and blood work showed acute renal failure with signs of sepsis.  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  Pt had NG placed but it is now removed and he was placed on clears.  Pt was coughing with all intake yesterday per RN.  MBS conducted 2017 as an OP showed mild difficulties with no aspiration/penetration.   Subjective: pt awake in chair, son Marden Noble present Assessment / Plan / Recommendation CHL IP CLINICAL IMPRESSIONS 03/06/2017 Clinical Impression MBS completed with son Marden Noble present.  Pt with moderate oral and mild pharyngeal dysphagia without aspiration of any consistency tested. He demonstrates delayed oral transiting/coordination resulting in lingual pumping, premature  spillage and piecemealing. Delay with oral transit was up to 7 seconds with liquid even with max cue -suspect d/t prior cva and motor planning.  Mild weakness in pharyngeal swallow - pt with mild residuals without sensation.  Also appeared with secretion retention during MBS in pharynx without ability to fully clear.  Cued dry swallows assist to decrease residuals.  Education live conducted with pt and son. Upon esophageal sweep, pt appeared clear.  Educated to ongoing aspiration risk due to his dysphagia and weakness.  Pt was willing to accept intake with son present but otherwise frequently refuses and nutritional risk present.  SLP Visit Diagnosis Dysphagia, oropharyngeal phase (R13.12) Attention and concentration deficit following -- Frontal lobe and executive function deficit following -- Impact on safety and function Moderate aspiration risk;Risk for inadequate nutrition/hydration   CHL IP TREATMENT RECOMMENDATION 03/06/2017 Treatment Recommendations Therapy as outlined in treatment plan below   Prognosis 03/06/2017 Prognosis for Safe Diet Advancement Fair Barriers to Reach Goals Motivation Barriers/Prognosis Comment -- CHL IP DIET RECOMMENDATION 03/06/2017 SLP Diet Recommendations Thin liquid Liquid Administration via Cup;Straw Medication Administration Whole meds with puree Compensations Slow rate;Small sips/bites Postural Changes Remain semi-upright after after feeds/meals (Comment);Seated upright at 90 degrees   CHL IP OTHER RECOMMENDATIONS 03/06/2017 Recommended Consults -- Oral Care Recommendations Oral care BID Other Recommendations --   CHL IP FOLLOW UP RECOMMENDATIONS 03/06/2017 Follow up Recommendations Skilled Nursing facility   [facility] IP FREQUENCY AND DURATION 03/06/2017 Speech Therapy Frequency (ACUTE ONLY) min 2x/week Treatment Duration 1 week      CHL IP ORAL PHASE 03/06/2017 Oral Phase Impaired Oral - Pudding Teaspoon -- Oral - Pudding Cup -- Oral - Honey Teaspoon -- Oral - Honey Cup -- Oral - Nectar  Teaspoon Weak lingual manipulation;Lingual pumping;Left pocketing in lateral sulci;Piecemeal swallowing;Delayed oral transit Oral - Nectar Cup Weak lingual manipulation;Lingual pumping;Piecemeal swallowing;Delayed oral transit Oral - Nectar Straw Weak lingual manipulation;Lingual pumping;Reduced posterior propulsion;Piecemeal swallowing;Delayed oral transit;Premature spillage Oral - Thin Teaspoon Weak lingual manipulation;Lingual pumping;Reduced posterior propulsion;Piecemeal swallowing;Delayed oral transit;Premature spillage Oral - Thin Cup Weak lingual manipulation;Lingual pumping;Piecemeal swallowing;Delayed oral transit;Premature spillage Oral - Thin Straw Weak lingual manipulation;Lingual pumping;Piecemeal swallowing;Delayed oral transit;Premature spillage Oral - Puree Delayed oral transit;Weak lingual manipulation Oral - Mech Soft Weak lingual manipulation;Impaired mastication;Premature spillage;Delayed oral transit Oral - Regular -- Oral - Multi-Consistency -- Oral - Pill -- Oral Phase - Comment delay in oral transitng up to 7 seconds with liquids despite max cues -   CHL IP PHARYNGEAL PHASE 03/06/2017 Pharyngeal Phase Impaired Pharyngeal- Pudding Teaspoon -- Pharyngeal -- Pharyngeal- Pudding Cup -- Pharyngeal -- Pharyngeal- Honey Teaspoon -- Pharyngeal -- Pharyngeal- Honey Cup -- Pharyngeal -- Pharyngeal- Nectar Teaspoon -- Pharyngeal -- Pharyngeal- Nectar Cup -- Pharyngeal -- Pharyngeal- Nectar Straw -- Pharyngeal -- Pharyngeal- Thin Teaspoon Reduced epiglottic inversion;Pharyngeal residue - valleculae;Pharyngeal residue - pyriform Pharyngeal -- Pharyngeal- Thin Cup Reduced epiglottic inversion;Pharyngeal residue - valleculae;Pharyngeal residue - pyriform Pharyngeal -- Pharyngeal- Thin Straw Reduced epiglottic inversion;Pharyngeal residue - valleculae;Pharyngeal residue - pyriform Pharyngeal -- Pharyngeal- Puree Delayed swallow initiation-vallecula;WFL Pharyngeal -- Pharyngeal- Mechanical Soft Delayed  swallow initiation-vallecula;WFL Pharyngeal -- Pharyngeal- Regular NT Pharyngeal -- Pharyngeal- Multi-consistency NT Pharyngeal -- Pharyngeal- Pill NT Pharyngeal -- Pharyngeal Comment pt does not sense residuals, cues to dry swallow decrease residuals but not fully clear,  appearance of cervical ostephytes may contribute to decreased clearance at pyriform sinus/cricopharyngeus  CHL IP CERVICAL ESOPHAGEAL PHASE 03/06/2017 Cervical Esophageal Phase Impaired Pudding Teaspoon -- Pudding Cup -- Honey Teaspoon -- Honey Cup -- Nectar Teaspoon -- Nectar Cup -- Nectar Straw -- Thin Teaspoon -- Thin Cup -- Thin Straw -- Puree -- Mechanical Soft -- Regular -- Multi-consistency -- Pill -- Cervical Esophageal Comment appearance of cervical ostephytes appeared may contribute to pyriform sinus/UES residuals, pt appeared with secretion retention retained in pharynx CHL IP GO 01/15/2016  Functional Assessment Tool Used mbs, clinical judgement Functional Limitations Swallowing Swallow Current Status (W3893) CI Swallow Goal Status (T3428) CI Swallow Discharge Status (J6811) CI Motor Speech Current Status (X7262) (None) Motor Speech Goal Status (M3559) (None) Motor Speech Goal Status (R4163) (None) Spoken Language Comprehension Current Status (A4536) (None) Spoken Language Comprehension Goal Status (I6803) (None) Spoken Language Comprehension Discharge Status [phone] (None) Spoken Language Expression Current Status [phone]) (None) Spoken Language Expression Goal Status [phone]) (None) Spoken Language Expression Discharge Status [phone]) (None) Attention Current Status (X4503) (None) Attention Goal Status (U8828) (None) Attention Discharge Status [phone]) (None) Memory Current Status (Z7915) (None) Memory Goal Status (A5697) (None) Memory Discharge Status (X4801) (None) Voice Current Status (K5537) (None) Voice Goal Status (S8270) (None) Voice Discharge Status (B8675) (None) Other Speech-Language Pathology Functional Limitation Current Status  (Q4920) (None) Other Speech-Language Pathology Functional Limitation Goal Status (F0071) (None) Other Speech-Language Pathology Functional Limitation Discharge Status [phone]) (None) Macario Golds 03/06/2017, 7:52 PM  Luanna Salk, [place] [facility] SLP [phone]             Microbiology: No results found for this or any previous visit (from the past 240 hour(s)). Labs: CBC:  Recent Labs Lab 03/09/17 0458 03/10/17 0807 03/11/17 0519 03/12/17 0437  WBC 9.0 7.6 6.3 5.7  NEUTROABS 5.7  --   --   --   HGB 10.6* 9.9* 10.7* 9.8*  HCT 32.6* 30.8* 33.4* 30.1*  MCV 80.9 81.3 81.7 80.3  PLT 233 242 257 826   Basic Metabolic Panel:  Recent Labs Lab 03/08/17 0502 03/09/17 0458 03/10/17 0807 03/11/17 0519 03/12/17 0437 03/13/17 0502 03/14/17 0350  NA 137 137 139 138 142 142 140  K 3.7 3.8 4.1 3.9 3.5 3.9 3.8  CL 101 103 105 108 110 108 109  CO2 29 27 28 24 24 26 25   GLUCOSE 202* 176* 124* 123* 92 87 87  BUN 19 15 17 17 14 15 17   CREATININE 0.86 0.83 1.05 0.98 0.99 1.14 1.20  CALCIUM 8.6* 8.6* 8.5* 8.3* 8.5* 8.5* 8.3*  MG 1.7 1.8  --   --   --  2.0  --   PHOS 3.0 3.4  --   --   --  3.7  --    Liver Function Tests:  Recent Labs Lab 03/09/17 0458 03/13/17 0502  AST 18 18  ALT 13* 10*  ALKPHOS 46 45  BILITOT 0.3 0.3  PROT 6.8 7.0  ALBUMIN 2.6* 2.7*   No results for input(s): LIPASE, AMYLASE in the last 168 hours.  Recent Labs Lab 03/08/17 1220  AMMONIA 34   Cardiac Enzymes: No results for input(s): CKTOTAL, CKMB, CKMBINDEX, TROPONINI in the last 168 hours. BNP (last 3 results) No results for input(s): BNP in the last 8760 hours. CBG:  Recent Labs Lab 03/13/17 1639 03/13/17 2042 03/14/17 0100 03/14/17 0501 03/14/17 0753  GLUCAP 98 71 80 79 83   Time spent: 45 minutes  Signed:  Tiannah Greenly  [facility]  03/14/2017  , 12:20 PM

## 2017-03-14 NOTE — Clinical Social Work Placement (Signed)
Patient received and accepted bed offer at [facility]. [facility] contacted, patient's family notified. Patient's RN can call report to [phone], packet complete.CSW signing off, no other needs identified at this time.  CLINICAL SOCIAL WORK PLACEMENT  NOTE  Date:  03/14/2017  Patient Details  Name: Derek Crawford MRN: [id number] Date of Birth: [birth year]  Clinical Social Work is seeking post-discharge placement for this patient at the [place] level of care (*CSW will initial, date and re-position this form in  chart as items are completed):  Yes   Patient/family provided with [facility] Work Department's list of facilities offering this level of care within the geographic area requested by the patient (or if unable, by the patient's family).  Yes   Patient/family informed of their freedom to choose among providers that offer the needed level of care, that participate in Medicare, Medicaid or managed care program needed by the patient, have an available bed and are willing to accept the patient.  Yes   Patient/family informed of [facility]'s ownership interest in [facility] and [facility], as well as of the fact that they are under no obligation to receive care at these facilities.  PASRR submitted to EDS on       PASRR number received on       Existing PASRR number confirmed on 03/01/17     FL2 transmitted to all facilities in geographic area requested by pt/family on 03/01/17     FL2 transmitted to all facilities within larger geographic area on       Patient informed that his/her managed care company has contracts with or will negotiate with certain facilities, including the following:        Yes   Patient/family informed of bed offers received.  Patient chooses bed at [facility]     Physician recommends and patient chooses bed at      Patient to be transferred to [facility] on 03/14/17.  Patient to be transferred to  facility by [facility]     Patient family notified on 03/14/17 of transfer.  Name of family member notified:  Laretta Bolster     PHYSICIAN       Additional Comment:    _______________________________________________ Burnis Medin, LCSW 03/14/2017, 1:41 PM

## 2017-03-14 NOTE — Progress Notes (Signed)
Report called to [facility]. Hospital course reviewed and all questions answered.

## 2017-03-15 ENCOUNTER — Encounter (HOSPITAL_COMMUNITY): Payer: Self-pay | Admitting: Emergency Medicine

## 2017-03-15 ENCOUNTER — Emergency Department (HOSPITAL_COMMUNITY)
Admission: EM | Admit: 2017-03-15 | Discharge: 2017-03-16 | Disposition: A | Discharge status: Home / Self Care | Payer: Medicare Other | Attending: Emergency Medicine | Admitting: Emergency Medicine

## 2017-03-15 ENCOUNTER — Emergency Department (HOSPITAL_COMMUNITY): Payer: Medicare Other

## 2017-03-15 DIAGNOSIS — Z79899 Other long term (current) drug therapy: Secondary | ICD-10-CM | POA: Insufficient documentation

## 2017-03-15 DIAGNOSIS — I10 Essential (primary) hypertension: Secondary | ICD-10-CM | POA: Diagnosis not present

## 2017-03-15 DIAGNOSIS — Z8673 Personal history of transient ischemic attack (TIA), and cerebral infarction without residual deficits: Secondary | ICD-10-CM | POA: Insufficient documentation

## 2017-03-15 DIAGNOSIS — E119 Type 2 diabetes mellitus without complications: Secondary | ICD-10-CM | POA: Diagnosis not present

## 2017-03-15 DIAGNOSIS — R1310 Dysphagia, unspecified: Secondary | ICD-10-CM | POA: Insufficient documentation

## 2017-03-15 DIAGNOSIS — Z7982 Long term (current) use of aspirin: Secondary | ICD-10-CM | POA: Diagnosis not present

## 2017-03-15 DIAGNOSIS — Z87891 Personal history of nicotine dependence: Secondary | ICD-10-CM | POA: Diagnosis not present

## 2017-03-15 LAB — BASIC METABOLIC PANEL
ANION GAP: 9 (ref 5–15)
BUN: 13 mg/dL (ref 6–20)
CALCIUM: 8.7 mg/dL — AB (ref 8.9–10.3)
CO2: 22 mmol/L (ref 22–32)
CREATININE: 1.09 mg/dL (ref 0.61–1.24)
Chloride: 109 mmol/L (ref 101–111)
GLUCOSE: 101 mg/dL — AB (ref 65–99)
Potassium: 4.5 mmol/L (ref 3.5–5.1)
Sodium: 140 mmol/L (ref 135–145)

## 2017-03-15 LAB — CBC WITH DIFFERENTIAL/PLATELET
BASOS ABS: 0 10*3/uL (ref 0.0–0.1)
Basophils Relative: 0 %
EOS ABS: 0.4 10*3/uL (ref 0.0–0.7)
Eosinophils Relative: 4 %
HCT: 35.3 % — ABNORMAL LOW (ref 39.0–52.0)
Hemoglobin: 11.3 g/dL — ABNORMAL LOW (ref 13.0–17.0)
LYMPHS PCT: 37 %
Lymphs Abs: 3.4 10*3/uL (ref 0.7–4.0)
MCH: 26.2 pg (ref 26.0–34.0)
MCHC: 32 g/dL (ref 30.0–36.0)
MCV: 81.7 fL (ref 78.0–100.0)
MONO ABS: 1.2 10*3/uL — AB (ref 0.1–1.0)
Monocytes Relative: 13 %
NEUTROS ABS: 4.1 10*3/uL (ref 1.7–7.7)
NEUTROS PCT: 46 %
PLATELETS: 280 10*3/uL (ref 150–400)
RBC: 4.32 MIL/uL (ref 4.22–5.81)
RDW: 14.9 % (ref 11.5–15.5)
WBC: 9.1 10*3/uL (ref 4.0–10.5)

## 2017-03-15 NOTE — Discharge Instructions (Signed)
Chest x-ray without evidence of any pneumonia. Patient's labs without any sniffing abnormalities. Patient stable for discharge back to nursing facility. Return for any new or worse symptoms.

## 2017-03-15 NOTE — ED Provider Notes (Signed)
[facility] DEPT Provider Note   CSN: [id number] Arrival date & time: 03/15/17  1656     History   Chief Complaint Chief Complaint  Patient presents with  . Aspiration    HPI Derek Crawford is a 83 y.o. male.  Patient discharged yesterday. Patient was admitted from August 15 through September 4. Admission was for sepsis. Patient known to have a history of dysphagia. And prior stroke. Patient sent in by [facility] facility with concerns for aspiration. Patient without any specific complaints here. Patient with baseline right-sided weakness lower extremity greater than upper extremity following stroke in the past. Also has some difficulty with dysphagia secondary to this.      Past Medical History:  Diagnosis Date  . Diabetes mellitus without complication ([place])   . GERD (gastroesophageal reflux disease)   . Glaucoma   . Hemiplegia following CVA (cerebrovascular accident) ([place])    R side weakness  . Hyperlipidemia   . Hypertension   . Iron deficiency anemia   . Muscle weakness   . Urinary incontinence     Patient Active Problem List   Diagnosis Date Noted  . Sepsis ([place])   . Dysphagia   . Encounter for palliative care   . Goals of care, counseling/discussion   . Abdominal distention   . Respiratory failure ([place]) 02/22/2017  . Ileus ([place])   . Encounter for central line placement   . Depressed mood 12/09/2013  . Allergic rhinitis 12/09/2013  . Psychosis 10/21/2013  . BPH (benign prostatic hyperplasia) 09/16/2013  . Large Tubular adenoma right colon 08/23/2013  . Unspecified constipation 08/07/2013  . Hypertension   . Hyperlipidemia   . Urinary incontinence   . Iron deficiency anemia 05/22/2013  . Dyslipidemia 05/22/2013  . Hemiplegia ([place]) 04/10/2007  . Coronary atherosclerosis 04/10/2007  . CVA 04/10/2007  . GERD 04/10/2007  . GLAUCOMA, HX OF 04/10/2007  . PERCUTANEOUS TRANSLUMINAL CORONARY ANGIOPLASTY, HX OF 04/10/2007    Past Surgical  History:  Procedure Laterality Date  . CATARACT EXTRACTION Left   . CIRCUMCISION    . COLONOSCOPY N/A 08/23/2013   Procedure: COLONOSCOPY;  Surgeon: Gatha Mayer, MD;  Location: [facility] ENDOSCOPY;  Service: Endoscopy;  Laterality: N/A;  . ESOPHAGOGASTRODUODENOSCOPY N/A 08/23/2013   Procedure: ESOPHAGOGASTRODUODENOSCOPY (EGD);  Surgeon: Gatha Mayer, MD;  Location: [facility];  Service: Endoscopy;  Laterality: N/A;  . FLEXIBLE SIGMOIDOSCOPY N/A 03/02/2017   Procedure: FLEXIBLE SIGMOIDOSCOPY;  Surgeon: [name], MD;  Location: [facility] ENDOSCOPY;  Service: Endoscopy;  Laterality: N/A;  . TONSILLECTOMY         Home Medications    Prior to Admission medications   Medication Sig Start Date End Date Taking? Authorizing Provider  aspirin 81 MG tablet Take 81 mg by mouth daily. 08/23/13  Yes Gatha Mayer, MD  atorvastatin (LIPITOR) 10 MG tablet Take 10 mg by mouth daily. Take one tablet at bedtime for hyperlipidemia   Yes [provider]  bisacodyl (DULCOLAX) 10 MG suppository Place 10 mg rectally as needed for moderate constipation.   Yes [provider]  fluticasone (FLONASE) 50 MCG/ACT nasal spray Place 1 spray into the nose 2 (two) times daily.    Yes [provider]  furosemide (LASIX) 20 MG tablet Take 1 tablet (20 mg total) by mouth daily as needed. 03/14/17  Yes Lavina Hamman, MD  latanoprost (XALATAN) 0.005 % ophthalmic solution Place 1 drop into both eyes at bedtime.   Yes [provider]  lubiprostone (AMITIZA) 24  MCG capsule Take 24 mcg by mouth 2 (two) times daily with a meal.   Yes [provider]  megestrol (MEGACE) 400 MG/10ML suspension Take 10 mLs (400 mg total) by mouth daily. 03/15/17  Yes Lavina Hamman, MD  Multiple Vitamins-Minerals (CERTAGEN SILVER PO) Take by mouth. Take one tablet daily   Yes [provider]  OLANZapine (ZYPREXA) 5 MG tablet Take 1 tablet (5 mg total) by mouth at bedtime. 03/14/17  Yes Lavina Hamman, MD  polyethylene glycol [facility]) packet Take 17 g by mouth daily as needed. 03/14/17  Yes Lavina Hamman, MD  simethicone (MYLICON) 80 MG chewable tablet Chew 1 tablet (80 mg total) by mouth 4 (four) times daily. 03/14/17  Yes Lavina Hamman, MD  vitamin B-12 (CYANOCOBALAMIN) 1000 MCG tablet Take 1,000 mcg by mouth daily.   Yes [provider]  Vitamin D, Ergocalciferol, (DRISDOL) 50000 units CAPS capsule Take 50,000 Units by mouth every 30 (thirty) days.   Yes [provider]  Amino Acids-Protein Hydrolys (FEEDING SUPPLEMENT, PRO-STAT SUGAR FREE 64,) LIQD Take 30 mLs by mouth 3 (three) times daily with meals. Patient not taking: Reported on 03/15/2017 03/14/17   Lavina Hamman, MD  ENSURE (ENSURE) Take 237 mLs by mouth 3 (three) times daily between meals. Patient not taking: Reported on 03/15/2017 03/14/17   Lavina Hamman, MD  Maltodextrin-Xanthan Gum (RESOURCE THICKENUP CLEAR) POWD Take 1 g by mouth as needed. Patient not taking: Reported on 03/15/2017 03/14/17   Lavina Hamman, MD    Family History Family History  Problem Relation Age of Onset  . Diabetes Maternal Grandfather   . Kidney disease Father   . Colon cancer Neg Hx   . Throat cancer Neg Hx   . Heart disease Neg Hx   . Liver disease Neg Hx     Social History Social History  Substance Use Topics  . Smoking status: Former Research scientist (life sciences)  . Smokeless tobacco: Never Used  . Alcohol use No     Allergies   Patient has no known allergies.   Review of Systems Review of Systems  Constitutional: Negative for fever.  HENT: Negative for congestion.   Eyes: Negative for redness.  Respiratory: Negative for shortness of breath.   Cardiovascular: Negative for chest pain.  Gastrointestinal: Negative for abdominal pain.  Genitourinary: Negative for dysuria.  Musculoskeletal: Negative for back pain.  Skin: Negative for rash.  Neurological: Positive for weakness. Negative for headaches.  Hematological: Does not  bruise/bleed easily.     Physical Exam Updated Vital Signs BP (!) 126/51   Pulse 63   Temp 97.8 F (36.6 C) (Oral)   Resp 16   Ht 1.753 m (5\' 9" )   Wt 79.4 kg (175 lb)   SpO2 97%   BMI 25.84 kg/m   Physical Exam  Constitutional: He appears well-developed and well-nourished. No distress.  HENT:  Head: Normocephalic and atraumatic.  Mouth/Throat: Oropharynx is clear and moist.  Eyes: Pupils are equal, round, and reactive to light. Conjunctivae and EOM are normal.  Neck: Normal range of motion. Neck supple.  Cardiovascular: Normal rate, regular rhythm and normal heart sounds.   Pulmonary/Chest: Effort normal and breath sounds normal.  Abdominal: Soft. Bowel sounds are normal.  Musculoskeletal: Normal range of motion.  Neurological: He is alert. A cranial nerve deficit is present. No sensory deficit. He exhibits abnormal muscle tone. Coordination abnormal.  Patient with right-sided weakness leg greater than arm.  Skin: Skin is warm.  Nursing note and vitals reviewed.    ED Treatments / Results  Labs (all labs ordered are listed, but only abnormal results are displayed) Labs Reviewed  BASIC METABOLIC PANEL - Abnormal; Notable for the following:       Result Value   Glucose, Bld 101 (*)    Calcium 8.7 (*)    All other components within normal limits  CBC WITH DIFFERENTIAL/PLATELET - Abnormal; Notable for the following:    Hemoglobin 11.3 (*)    HCT 35.3 (*)    All other components within normal limits  CBC WITH DIFFERENTIAL/PLATELET    EKG  EKG Interpretation None       Radiology Dg Chest 2 View  Result Date: 03/15/2017 CLINICAL DATA:  Question aspiration. Patient is confused, denies chest complaints. EXAM: CHEST  2 VIEW COMPARISON:  Chest radiograph 02/27/2017. FINDINGS: Endotracheal and enteric tubes have been removed. Left central line is been removed. Unchanged heart size and mediastinal contours with retrocardiac hiatal hernia. Improved bibasilar aeration  from prior exam with mild residual atelectasis. No new focal airspace opacity. Possible tiny pleural effusions. No pneumothorax. Stable pulmonary vasculature. IMPRESSION: Improved bibasilar aeration from prior exam. Persistent bibasilar atelectasis which is nonspecific in the setting. Exam is otherwise unchanged. Electronically Signed   By: Jeb Levering M.D.   On: 03/15/2017 20:25    Procedures Procedures (including critical care time)  Medications Ordered in ED Medications - No data to display   Initial Impression / Assessment and Plan / ED Course  I have reviewed the triage vital signs and the nursing notes.  Pertinent labs & imaging results that were available during my care of the patient were reviewed by me and considered in my medical decision making (see chart for details).     Patient without any specific complaints here. Chest x-ray showed no evidence of pneumonia. Patient's labs without significant changes. Patient stable for discharge back to nursing facility. Patient nontoxic no acute distress.  Final Clinical Impressions(s) / ED Diagnoses   Final diagnoses:  Dysphagia, unspecified type    New Prescriptions New Prescriptions   No medications on file     Fredia Sorrow, MD 03/15/17 [phone]

## 2017-03-15 NOTE — ED Triage Notes (Addendum)
Per [facility]: Pt from [facility] with c/o difficulty swallowing and suspected aspiration PNA. Pt A/O x4.  PTA: 137/74, HR 70, RR 20, CBG 122.  No medications administered pta.

## 2017-03-15 NOTE — ED Notes (Signed)
Attempted to call RN at [facility] no answered.

## 2017-03-15 NOTE — ED Notes (Signed)
Patient transported to X-ray 

## 2017-03-16 ENCOUNTER — Telehealth: Payer: Self-pay | Admitting: Gastroenterology

## 2017-03-16 NOTE — Telephone Encounter (Signed)
I haven't seen him in over two weeks.  I spoke with speech pathologist. He is not vomiting or seeming to have signficant abominal pains.  I explained that he is probably safe to advance diet if she thinks he is safe from a swallow perspective. I asked that they have him evaluated by MD or PA that is available at their facility.

## 2017-03-16 NOTE — Telephone Encounter (Signed)
Patient has been on a thin liquid diet since hospitalization.  Speech pathology would it be ok to advance to pureed solids from the perspective of the ileus.  I am sending this to you because you saw him while he was inpatient

## 2017-03-16 NOTE — ED Notes (Signed)
Pt was changed

## 2017-07-10 ENCOUNTER — Other Ambulatory Visit: Payer: Self-pay

## 2017-09-28 ENCOUNTER — Other Ambulatory Visit: Payer: Self-pay

## 2017-09-28 ENCOUNTER — Emergency Department (HOSPITAL_COMMUNITY): Payer: Medicare Other

## 2017-09-28 ENCOUNTER — Emergency Department (HOSPITAL_COMMUNITY)
Admission: EM | Admit: 2017-09-28 | Discharge: 2017-09-29 | Disposition: A | Discharge status: Home / Self Care | Payer: Medicare Other | Attending: Emergency Medicine | Admitting: Emergency Medicine

## 2017-09-28 ENCOUNTER — Encounter (HOSPITAL_COMMUNITY): Payer: Self-pay | Admitting: Emergency Medicine

## 2017-09-28 DIAGNOSIS — E119 Type 2 diabetes mellitus without complications: Secondary | ICD-10-CM | POA: Insufficient documentation

## 2017-09-28 DIAGNOSIS — I10 Essential (primary) hypertension: Secondary | ICD-10-CM | POA: Insufficient documentation

## 2017-09-28 DIAGNOSIS — Z79899 Other long term (current) drug therapy: Secondary | ICD-10-CM | POA: Insufficient documentation

## 2017-09-28 DIAGNOSIS — Y9389 Activity, other specified: Secondary | ICD-10-CM | POA: Diagnosis not present

## 2017-09-28 DIAGNOSIS — I69351 Hemiplegia and hemiparesis following cerebral infarction affecting right dominant side: Secondary | ICD-10-CM | POA: Diagnosis not present

## 2017-09-28 DIAGNOSIS — N390 Urinary tract infection, site not specified: Secondary | ICD-10-CM | POA: Insufficient documentation

## 2017-09-28 DIAGNOSIS — Y92128 Other place in nursing home as the place of occurrence of the external cause: Secondary | ICD-10-CM | POA: Insufficient documentation

## 2017-09-28 DIAGNOSIS — Y999 Unspecified external cause status: Secondary | ICD-10-CM | POA: Diagnosis not present

## 2017-09-28 DIAGNOSIS — W19XXXA Unspecified fall, initial encounter: Secondary | ICD-10-CM

## 2017-09-28 DIAGNOSIS — Z87891 Personal history of nicotine dependence: Secondary | ICD-10-CM | POA: Insufficient documentation

## 2017-09-28 DIAGNOSIS — W050XXA Fall from non-moving wheelchair, initial encounter: Secondary | ICD-10-CM | POA: Insufficient documentation

## 2017-09-28 DIAGNOSIS — Z7982 Long term (current) use of aspirin: Secondary | ICD-10-CM | POA: Diagnosis not present

## 2017-09-28 DIAGNOSIS — S0990XA Unspecified injury of head, initial encounter: Secondary | ICD-10-CM | POA: Diagnosis present

## 2017-09-28 LAB — CBC
HEMATOCRIT: 36.6 % — AB (ref 39.0–52.0)
HEMOGLOBIN: 12.3 g/dL — AB (ref 13.0–17.0)
MCH: 27.8 pg (ref 26.0–34.0)
MCHC: 33.6 g/dL (ref 30.0–36.0)
MCV: 82.8 fL (ref 78.0–100.0)
Platelets: 156 10*3/uL (ref 150–400)
RBC: 4.42 MIL/uL (ref 4.22–5.81)
RDW: 13.8 % (ref 11.5–15.5)
WBC: 10.1 10*3/uL (ref 4.0–10.5)

## 2017-09-28 LAB — BASIC METABOLIC PANEL
ANION GAP: 10 (ref 5–15)
BUN: 19 mg/dL (ref 6–20)
CALCIUM: 8.5 mg/dL — AB (ref 8.9–10.3)
CHLORIDE: 110 mmol/L (ref 101–111)
CO2: 19 mmol/L — AB (ref 22–32)
Creatinine, Ser: 1.26 mg/dL — ABNORMAL HIGH (ref 0.61–1.24)
GFR calc Af Amer: 59 mL/min — ABNORMAL LOW (ref >60)
GFR calc non Af Amer: 51 mL/min — ABNORMAL LOW (ref >60)
GLUCOSE: 179 mg/dL — AB (ref 65–99)
Potassium: 4 mmol/L (ref 3.5–5.1)
Sodium: 139 mmol/L (ref 135–145)

## 2017-09-28 NOTE — ED Notes (Signed)
ED Provider at bedside. 

## 2017-09-28 NOTE — ED Notes (Signed)
Missed IV x2. Phlebotomy at bedside

## 2017-09-28 NOTE — ED Notes (Signed)
Blood collected by phleb KM,  Per RN he accidentally clicked labs off.

## 2017-09-28 NOTE — ED Triage Notes (Addendum)
Per EMS, pt coming from [facility] with an unwitnessed fall. Pt was in is wheel chair  and fell onto his face. No lacerations noted and pt denies pain. A&O x3 at this time which staff said is his baseline. R sided deficit at baseline from prior CVA. No new deficits noted via staff. Pt is not on blood thinners. Pt did receive trazodone from [facility] staff before arriving to ED. Pt appears to be drowsy.

## 2017-09-28 NOTE — ED Notes (Signed)
Patient transported to CT 

## 2017-09-29 ENCOUNTER — Emergency Department (HOSPITAL_COMMUNITY): Payer: Medicare Other

## 2017-09-29 LAB — URINALYSIS, ROUTINE W REFLEX MICROSCOPIC
BILIRUBIN URINE: NEGATIVE
GLUCOSE, UA: NEGATIVE mg/dL
KETONES UR: NEGATIVE mg/dL
NITRITE: POSITIVE — AB
PH: 5 (ref 5.0–8.0)
Protein, ur: 100 mg/dL — AB
Specific Gravity, Urine: 1.017 (ref 1.005–1.030)

## 2017-09-29 LAB — CBG MONITORING, ED: Glucose-Capillary: 135 mg/dL — ABNORMAL HIGH (ref 65–99)

## 2017-09-29 MED ORDER — CEPHALEXIN 500 MG PO CAPS: 500.0000 mg | ORAL_CAPSULE | Freq: Two times a day (BID) | ORAL | 0 refills | Status: AC

## 2017-09-29 MED ORDER — CEPHALEXIN 250 MG PO CAPS: 500.0000 mg | ORAL_CAPSULE | Freq: Once | ORAL | Status: DC

## 2017-09-29 NOTE — ED Notes (Signed)
[facility] paged. 

## 2017-09-29 NOTE — ED Notes (Signed)
Patient verbalizes understanding of discharge instructions. Opportunity for questioning and answers were provided. Armband removed by staff, pt discharged from ED. E signature not avaiable at this time. Pt leaving via [facility].

## 2017-09-29 NOTE — ED Provider Notes (Signed)
[facility] EMERGENCY DEPARTMENT Provider Note   CSN: [id number] Arrival date & time: 09/28/17  2222     History   Chief Complaint Chief Complaint  Patient presents with  . Fall    HPI Vertis Scheib is a 84 y.o. male with a history of diabetes mellitus, right-sided hemiplegia following CVA, HTN, large tubular adenoma of the right colon, and hyperlipidemia who presents to the emergency department by EMS from [facility] after an unwitnessed fall.  Staff reports that the patient was sitting in his wheelchair and fell forward and hit his face.  Staff reports the patient was at his baseline on EMS arrival.  He was given his home trazodone prior to arrival.  Patient has no other complaints at this time.  He is wheelchair-bound.  He does not take blood thinners.  The history is provided by the patient. No language interpreter was used.    Past Medical History:  Diagnosis Date  . Diabetes mellitus without complication ([place])   . GERD (gastroesophageal reflux disease)   . Glaucoma   . Hemiplegia following CVA (cerebrovascular accident) ([place])    R side weakness  . Hyperlipidemia   . Hypertension   . Iron deficiency anemia   . Muscle weakness   . Urinary incontinence     Patient Active Problem List   Diagnosis Date Noted  . Sepsis ([place])   . Dysphagia   . Encounter for palliative care   . Goals of care, counseling/discussion   . Abdominal distention   . Respiratory failure ([place]) 02/22/2017  . Ileus ([place])   . Encounter for central line placement   . Depressed mood 12/09/2013  . Allergic rhinitis 12/09/2013  . Psychosis ([place]) 10/21/2013  . BPH (benign prostatic hyperplasia) 09/16/2013  . Large Tubular adenoma right colon 08/23/2013  . Unspecified constipation 08/07/2013  . Hypertension   . Hyperlipidemia   . Urinary incontinence   . Iron deficiency anemia 05/22/2013  . Dyslipidemia 05/22/2013  . Hemiplegia ([place]) 04/10/2007  . Coronary atherosclerosis  04/10/2007  . CVA 04/10/2007  . GERD 04/10/2007  . GLAUCOMA, HX OF 04/10/2007  . PERCUTANEOUS TRANSLUMINAL CORONARY ANGIOPLASTY, HX OF 04/10/2007    Past Surgical History:  Procedure Laterality Date  . CATARACT EXTRACTION Left   . CIRCUMCISION    . COLONOSCOPY N/A 08/23/2013   Procedure: COLONOSCOPY;  Surgeon: Gatha Mayer, MD;  Location: [facility];  Service: Endoscopy;  Laterality: N/A;  . ESOPHAGOGASTRODUODENOSCOPY N/A 08/23/2013   Procedure: ESOPHAGOGASTRODUODENOSCOPY (EGD);  Surgeon: Gatha Mayer, MD;  Location: [facility];  Service: Endoscopy;  Laterality: N/A;  . FLEXIBLE SIGMOIDOSCOPY N/A 03/02/2017   Procedure: FLEXIBLE SIGMOIDOSCOPY;  Surgeon: [name], MD;  Location: [facility];  Service: Endoscopy;  Laterality: N/A;  . TONSILLECTOMY         Home Medications    Prior to Admission medications   Medication Sig Start Date End Date Taking? Authorizing Provider  aspirin 81 MG tablet Take 81 mg by mouth daily. 08/23/13  Yes Gatha Mayer, MD  bisacodyl (DULCOLAX) 10 MG suppository Place 10 mg rectally as needed for moderate constipation.   Yes [provider]  divalproex (DEPAKOTE SPRINKLE) 125 MG capsule Take 500 mg by mouth See admin instructions. With meals   Yes [provider]  fluticasone (FLONASE) 50 MCG/ACT nasal spray Place 1 spray into the nose 2 (two) times daily.    Yes [provider]  furosemide (LASIX) 20 MG tablet Take 1 tablet (20 mg  total) by mouth daily as needed. 03/14/17  Yes Lavina Hamman, MD  latanoprost (XALATAN) 0.005 % ophthalmic solution Place 1 drop into both eyes at bedtime.   Yes [provider]  lubiprostone (AMITIZA) 24 MCG capsule Take 24 mcg by mouth 2 (two) times daily with a meal.   Yes [provider]  megestrol (MEGACE) 400 MG/10ML suspension Take 10 mLs (400 mg total) by mouth daily. 03/15/17  Yes Lavina Hamman, MD  Multiple Vitamins-Minerals (CERTAGEN SILVER PO) Take by mouth.  Take one tablet daily   Yes [provider]  polyethylene glycol (MIRALAX) packet Take 17 g by mouth daily as needed. 03/14/17  Yes Lavina Hamman, MD  simethicone (MYLICON) 80 MG chewable tablet Chew 1 tablet (80 mg total) by mouth 4 (four) times daily. 03/14/17  Yes Lavina Hamman, MD  traZODone (DESYREL) 50 MG tablet Take 25 mg by mouth 2 (two) times daily.   Yes [provider]  vitamin B-12 (CYANOCOBALAMIN) 1000 MCG tablet Take 1,000 mcg by mouth daily.   Yes [provider]  Vitamin D, Ergocalciferol, (DRISDOL) 50000 units CAPS capsule Take 50,000 Units by mouth every 30 (thirty) days.   Yes [provider]  Amino Acids-Protein Hydrolys (FEEDING SUPPLEMENT, PRO-STAT SUGAR FREE 64,) LIQD Take 30 mLs by mouth 3 (three) times daily with meals. Patient not taking: Reported on 03/15/2017 03/14/17   Lavina Hamman, MD  ENSURE (ENSURE) Take 237 mLs by mouth 3 (three) times daily between meals. Patient not taking: Reported on 03/15/2017 03/14/17   Lavina Hamman, MD  Maltodextrin-Xanthan Gum (RESOURCE THICKENUP CLEAR) POWD Take 1 g by mouth as needed. Patient not taking: Reported on 03/15/2017 03/14/17   Lavina Hamman, MD  OLANZapine (ZYPREXA) 5 MG tablet Take 1 tablet (5 mg total) by mouth at bedtime. Patient not taking: Reported on 09/28/2017 03/14/17   Lavina Hamman, MD    Family History Family History  Problem Relation Age of Onset  . Diabetes Maternal Grandfather   . Kidney disease Father   . Colon cancer Neg Hx   . Throat cancer Neg Hx   . Heart disease Neg Hx   . Liver disease Neg Hx     Social History Social History   Tobacco Use  . Smoking status: Former Research scientist (life sciences)  . Smokeless tobacco: Never Used  Substance Use Topics  . Alcohol use: No  . Drug use: No     Allergies   Patient has no known allergies.   Review of Systems Review of Systems  Constitutional: Negative for appetite change and fever.  Respiratory: Negative for shortness of breath.     Cardiovascular: Negative for chest pain.  Gastrointestinal: Negative for abdominal pain.  Genitourinary: Negative for dysuria.  Musculoskeletal: Positive for arthralgias and myalgias. Negative for back pain and neck pain.  Skin: Negative for rash.  Allergic/Immunologic: Negative for immunocompromised state.  Neurological: Negative for headaches.  Psychiatric/Behavioral: Negative for confusion.   Physical Exam Updated Vital Signs BP (!) 106/51   Pulse (!) 102   Temp 98.2 F (36.8 C) (Oral)   Resp (!) 22   Ht 5\' 5"  (1.651 m)   Wt 79.4 kg (175 lb)   SpO2 100%   BMI 29.12 kg/m   Physical Exam  Constitutional: He appears well-developed.  Elderly, chronically ill-appearing male  HENT:  Head: Normocephalic.  Eyes: Pupils are equal, round, and reactive to light. Conjunctivae and EOM are normal. No scleral icterus.  Neck: Neck supple.  Cardiovascular: Normal rate, regular rhythm and normal heart sounds. Exam reveals no gallop and no friction rub.  No murmur heard. Pulmonary/Chest: Effort normal and breath sounds normal. No stridor. No respiratory distress. He has no wheezes. He has no rales.  Bilateral clavicles and ribs are nontender to palpation.  Abdominal: Soft. Bowel sounds are normal. He exhibits no distension and no mass. There is no tenderness. There is no rebound and no guarding. No hernia.  Abdomen is nontender.  Musculoskeletal: He exhibits tenderness. He exhibits no edema or deformity.  Head to toe exam.  No tenderness to palpation to the cervical, thoracic, or lumbar spinous processes.  He is diffusely tender to palpation to the right shoulder and right ankle.  No overlying erythema, warmth, edema, or ecchymosis.  Full passive range of motion of both joints.  The remaining joints of the bilateral upper and lower extremities are nontender.   DP and radial pulses are 2+ and symmetric. Strength is 4/5 against resistance on the right vs 5/5 on the left.   Neurological:   Somnolent, but easily arousable  Skin: Skin is warm and dry. No rash noted. No erythema.  Psychiatric: His behavior is normal.  Nursing note and vitals reviewed.    ED Treatments / Results  Labs (all labs ordered are listed, but only abnormal results are displayed) Labs Reviewed  BASIC METABOLIC PANEL - Abnormal; Notable for the following components:      Result Value   CO2 19 (*)    Glucose, Bld 179 (*)    Creatinine, Ser 1.26 (*)    Calcium 8.5 (*)    GFR calc non Af Amer 51 (*)    GFR calc Af Amer 59 (*)    All other components within normal limits  CBC - Abnormal; Notable for the following components:   Hemoglobin 12.3 (*)    HCT 36.6 (*)    All other components within normal limits  CBG MONITORING, ED - Abnormal; Notable for the following components:   Glucose-Capillary 135 (*)    All other components within normal limits  URINALYSIS, ROUTINE W REFLEX MICROSCOPIC    EKG  EKG Interpretation  Date/Time:  Thursday September 28 2017 23:20:47 EDT Ventricular Rate:  94 PR Interval:    QRS Duration: 94 QT Interval:  348 QTC Calculation: 436 R Axis:   -68 Text Interpretation:  Sinus rhythm Borderline short PR interval Abnormal R-wave progression, early transition Inferior infarct, age indeterminate Consider anterolateral infarct No significant change since last tracing Confirmed by Pryor Curia [phone]) on 09/28/2017 11:56:48 PM       Radiology Dg Chest 2 View  Result Date: 09/29/2017 CLINICAL DATA:  Unwitnessed fall with right shoulder pain. EXAM: CHEST - 2 VIEW COMPARISON:  Radiographs 03/15/2017 FINDINGS: Persistent low lung volumes. Unchanged heart size and mediastinal contours. Retrocardiac hiatal hernia. Improved bibasilar atelectasis. No pleural effusion, pneumothorax or new focal airspace disease. No evidence of acute osseous abnormality, arms down positioning partially limits lateral view. IMPRESSION: 1. No acute traumatic abnormality of the thorax. 2. Hiatal  hernia.  Bibasilar atelectasis. Electronically Signed   By: Jeb Levering M.D.   On: 09/29/2017 01:15   Dg Shoulder Right  Result Date: 09/29/2017 CLINICAL DATA:  Post unwitnessed fall with right shoulder pain. EXAM: RIGHT SHOULDER - 2+ VIEW COMPARISON:  Chest radiograph 03/15/2017 FINDINGS: There is no evidence of fracture or dislocation. Chronic superior subluxation of the humeral head consistent with underlying rotator cuff arthropathy. Soft tissues are unremarkable. IMPRESSION: No fracture  or dislocation of the right shoulder. Electronically Signed   By: Jeb Levering M.D.   On: 09/29/2017 01:17   Dg Ankle 2 Views Right  Result Date: 09/29/2017 CLINICAL DATA:  Unwitnessed fall with right ankle pain. EXAM: RIGHT ANKLE - 2 VIEW COMPARISON:  None. FINDINGS: No evidence of acute fracture. Examination is slightly limited due to difficulty with positioning, patient was uncooperative and combative. Ankle mortise is grossly maintained. No ankle joint effusion. Suspect lateral soft tissue edema. Bones are under mineralized. IMPRESSION: No evidence of acute fracture or dislocation of the right ankle, allowing for limitations due to difficulty with positioning. Suspect lateral soft tissue edema. Electronically Signed   By: Jeb Levering M.D.   On: 09/29/2017 01:16   Ct Head Wo Contrast  Result Date: 09/29/2017 CLINICAL DATA:  Unwitnessed fall onto face. Right-sided deficits at baseline prior infarct. Maxillofacial blunt trauma. EXAM: CT HEAD WITHOUT CONTRAST CT MAXILLOFACIAL WITHOUT CONTRAST CT CERVICAL SPINE WITHOUT CONTRAST TECHNIQUE: Multidetector CT imaging of the head, cervical spine, and maxillofacial structures were performed using the standard protocol without intravenous contrast. Multiplanar CT image reconstructions of the cervical spine and maxillofacial structures were also generated. COMPARISON:  None. FINDINGS: CT HEAD FINDINGS Brain: Sulcal and ventricular prominence consistent with  cerebral atrophy with remote left parietal infarct with resultant encephalomalacia. No acute intracranial hemorrhage, midline shift or edema. Intra-axial mass nor extra-axial fluid collections. Midline fourth ventricle and basal cisterns. Vascular: No hyperdense vessel sign. Skull: Intact Other: None CT MAXILLOFACIAL FINDINGS Osseous: No fracture or mandibular dislocation. No destructive process. Orbits: Left cataract extraction. No traumatic or inflammatory finding. Sinuses: Mild circumferential mucosal thickening of the left maxillary sinus. Soft tissues: No significant soft tissue swelling. CT CERVICAL SPINE FINDINGS Alignment: Reversal cervical lordosis with mild levoconvex curvature likely positional. Intact craniocervical relationship and atlantodental interval. Skull base and vertebrae: Negative for fracture or listhesis. Soft tissues and spinal canal: No prevertebral soft tissue swelling. No visible canal hematoma. Disc levels: Moderate to marked disc space narrowing from C2 through C7 most marked at C6-7. Bilateral uncovertebral joint osteoarthritis from C3 through C7 and on the right at C2-3. Mild bilateral C4-5 neural foraminal encroachment from uncinate spurring and osteophytes. Upper chest: No acute abnormality. Other: None IMPRESSION: 1. Remote left parietal infarct with volume loss. Otherwise chronic minimal small vessel ischemia. No acute intracranial abnormality. 2. No acute cervical spine fracture.  Cervical spondylosis. 3. Left maxillary sinus mucosal thickening. No acute maxillofacial fracture. Electronically Signed   By: Ashley Royalty M.D.   On: 09/29/2017 00:48   Ct Cervical Spine Wo Contrast  Result Date: 09/29/2017 CLINICAL DATA:  Unwitnessed fall onto face. Right-sided deficits at baseline prior infarct. Maxillofacial blunt trauma. EXAM: CT HEAD WITHOUT CONTRAST CT MAXILLOFACIAL WITHOUT CONTRAST CT CERVICAL SPINE WITHOUT CONTRAST TECHNIQUE: Multidetector CT imaging of the head, cervical  spine, and maxillofacial structures were performed using the standard protocol without intravenous contrast. Multiplanar CT image reconstructions of the cervical spine and maxillofacial structures were also generated. COMPARISON:  None. FINDINGS: CT HEAD FINDINGS Brain: Sulcal and ventricular prominence consistent with cerebral atrophy with remote left parietal infarct with resultant encephalomalacia. No acute intracranial hemorrhage, midline shift or edema. Intra-axial mass nor extra-axial fluid collections. Midline fourth ventricle and basal cisterns. Vascular: No hyperdense vessel sign. Skull: Intact Other: None CT MAXILLOFACIAL FINDINGS Osseous: No fracture or mandibular dislocation. No destructive process. Orbits: Left cataract extraction. No traumatic or inflammatory finding. Sinuses: Mild circumferential mucosal thickening of the left maxillary sinus. Soft tissues: No  significant soft tissue swelling. CT CERVICAL SPINE FINDINGS Alignment: Reversal cervical lordosis with mild levoconvex curvature likely positional. Intact craniocervical relationship and atlantodental interval. Skull base and vertebrae: Negative for fracture or listhesis. Soft tissues and spinal canal: No prevertebral soft tissue swelling. No visible canal hematoma. Disc levels: Moderate to marked disc space narrowing from C2 through C7 most marked at C6-7. Bilateral uncovertebral joint osteoarthritis from C3 through C7 and on the right at C2-3. Mild bilateral C4-5 neural foraminal encroachment from uncinate spurring and osteophytes. Upper chest: No acute abnormality. Other: None IMPRESSION: 1. Remote left parietal infarct with volume loss. Otherwise chronic minimal small vessel ischemia. No acute intracranial abnormality. 2. No acute cervical spine fracture.  Cervical spondylosis. 3. Left maxillary sinus mucosal thickening. No acute maxillofacial fracture. Electronically Signed   By: Ashley Royalty M.D.   On: 09/29/2017 00:48   Dg Hips Bilat W  Or Wo Pelvis 3-4 Views  Result Date: 09/29/2017 CLINICAL DATA:  Unwitnessed fall with bilateral hip pain. EXAM: DG HIP (WITH OR WITHOUT PELVIS) 3-4V BILAT COMPARISON:  None. FINDINGS: The bones are under mineralized. The cortical margins of the bony pelvis are intact. No fracture. Pubic symphysis and sacroiliac joints are congruent. Both femoral heads are well-seated in the respective acetabula. Age related degenerative change of both hips. IMPRESSION: No fracture of the pelvis or hips. Electronically Signed   By: Jeb Levering M.D.   On: 09/29/2017 01:14   Ct Maxillofacial Wo Contrast  Result Date: 09/29/2017 CLINICAL DATA:  Unwitnessed fall onto face. Right-sided deficits at baseline prior infarct. Maxillofacial blunt trauma. EXAM: CT HEAD WITHOUT CONTRAST CT MAXILLOFACIAL WITHOUT CONTRAST CT CERVICAL SPINE WITHOUT CONTRAST TECHNIQUE: Multidetector CT imaging of the head, cervical spine, and maxillofacial structures were performed using the standard protocol without intravenous contrast. Multiplanar CT image reconstructions of the cervical spine and maxillofacial structures were also generated. COMPARISON:  None. FINDINGS: CT HEAD FINDINGS Brain: Sulcal and ventricular prominence consistent with cerebral atrophy with remote left parietal infarct with resultant encephalomalacia. No acute intracranial hemorrhage, midline shift or edema. Intra-axial mass nor extra-axial fluid collections. Midline fourth ventricle and basal cisterns. Vascular: No hyperdense vessel sign. Skull: Intact Other: None CT MAXILLOFACIAL FINDINGS Osseous: No fracture or mandibular dislocation. No destructive process. Orbits: Left cataract extraction. No traumatic or inflammatory finding. Sinuses: Mild circumferential mucosal thickening of the left maxillary sinus. Soft tissues: No significant soft tissue swelling. CT CERVICAL SPINE FINDINGS Alignment: Reversal cervical lordosis with mild levoconvex curvature likely positional. Intact  craniocervical relationship and atlantodental interval. Skull base and vertebrae: Negative for fracture or listhesis. Soft tissues and spinal canal: No prevertebral soft tissue swelling. No visible canal hematoma. Disc levels: Moderate to marked disc space narrowing from C2 through C7 most marked at C6-7. Bilateral uncovertebral joint osteoarthritis from C3 through C7 and on the right at C2-3. Mild bilateral C4-5 neural foraminal encroachment from uncinate spurring and osteophytes. Upper chest: No acute abnormality. Other: None IMPRESSION: 1. Remote left parietal infarct with volume loss. Otherwise chronic minimal small vessel ischemia. No acute intracranial abnormality. 2. No acute cervical spine fracture.  Cervical spondylosis. 3. Left maxillary sinus mucosal thickening. No acute maxillofacial fracture. Electronically Signed   By: Ashley Royalty M.D.   On: 09/29/2017 00:48    Procedures Procedures (including critical care time)  Medications Ordered in ED Medications - No data to display   Initial Impression / Assessment and Plan / ED Course  I have reviewed the triage vital signs and the nursing notes.  Pertinent labs & imaging results that were available during my care of the patient were reviewed by me and considered in my medical decision making (see chart for details).    84 year old male with a history of diabetes mellitus, right-sided hemiplegia following CVA, HTN, and hyperlipidemia resenting by EMS from [facility] after an unwitnessed fall from his wheelchair to the floor.  On exam, he appears drowsy, but easily arouses to voice before falling back asleep during the exam. He winces in pain with palpation of the right shoulder and right ankle.  Head to toe exam is otherwise unremarkable.  Urine, labs, and imaging are pending.  Patient was seen and evaluated by Dr. Leonides Schanz, attending physician.  Patient care transferred to [facility] at the end of my shift. Patient presentation, ED course, and  plan of care discussed with review of all pertinent labs and imaging. Please see his/her note for further details regarding further ED course and disposition.  Final Clinical Impressions(s) / ED Diagnoses   Final diagnoses:  Fall, initial encounter    ED Discharge Orders    None       McDonald, Mia A, PA-C 09/29/17 0144

## 2017-09-29 NOTE — ED Provider Notes (Addendum)
Patient signed out to me.  Had fall and nursing home.  No significant acute findings on imaging.  UA consistent with UTI.  Seen by and discussed with Dr. Leonides Schanz, who agrees with the plan.   Montine Circle, PA-C 09/29/17 0225    Montine Circle, PA-C 09/29/17 [phone]

## 2017-09-29 NOTE — ED Provider Notes (Signed)
Medical screening examination/treatment/procedure(s) were conducted as a shared visit with non-physician practitioner(s) and myself.  I personally evaluated the patient during the encounter.   EKG Interpretation  Date/Time:  Thursday September 28 2017 23:20:47 EDT Ventricular Rate:  94 PR Interval:    QRS Duration: 94 QT Interval:  348 QTC Calculation: 436 R Axis:   -68 Text Interpretation:  Sinus rhythm Borderline short PR interval Abnormal R-wave progression, early transition Inferior infarct, age indeterminate Consider anterolateral infarct No significant change since last tracing Confirmed by Pryor Curia [phone]) on 09/28/2017 11:56:48 PM      Patient is an 84 year old male who had an unwitnessed fall at a nursing facility.  He is demented at baseline.  Labs show no significant abnormality.  Imaging shows no acute abnormality.  Patient does have a urinary tract infection.  Will treat.  Will discharge back to nursing facility.   Jeanett Antonopoulos, Delice Bison, DO 09/29/17 0211

## 2017-09-29 NOTE — ED Notes (Signed)
Pt back in room from CT 

## 2018-01-08 DEATH — deceased

## 2018-04-11 IMAGING — CT CT CERVICAL SPINE W/O CM
3 of 13 series · 9 of 34 positions shown, 10 images · non-contrast
Comparison: None.

CLINICAL DATA: Unwitnessed fall onto face. Right-sided deficits at
baseline prior infarct. Maxillofacial blunt trauma.

EXAM:
CT HEAD WITHOUT CONTRAST
CT MAXILLOFACIAL WITHOUT CONTRAST
CT CERVICAL SPINE WITHOUT CONTRAST
TECHNIQUE: Multidetector CT imaging of the head, cervical spine, and
maxillofacial structures were performed using the standard protocol
without intravenous contrast. Multiplanar CT image reconstructions
of the cervical spine and maxillofacial structures were also
generated.

[Series 15: sagittal soft tissue · sagittal · 0.29mm/px · 5 of 79 slices shown]
[im 14/79  bone]
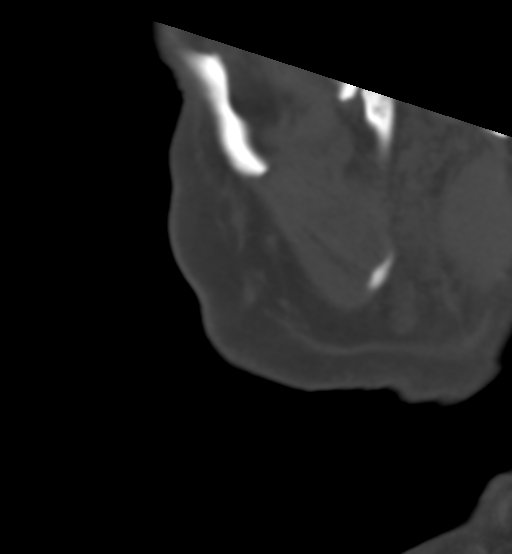
[im 27/79  bone]
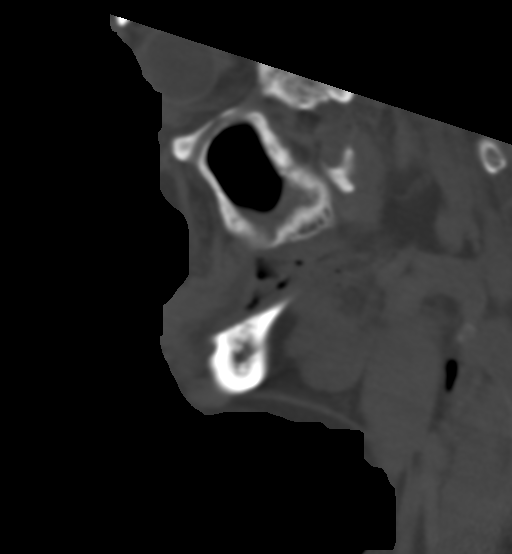
[im 40/79  bone]
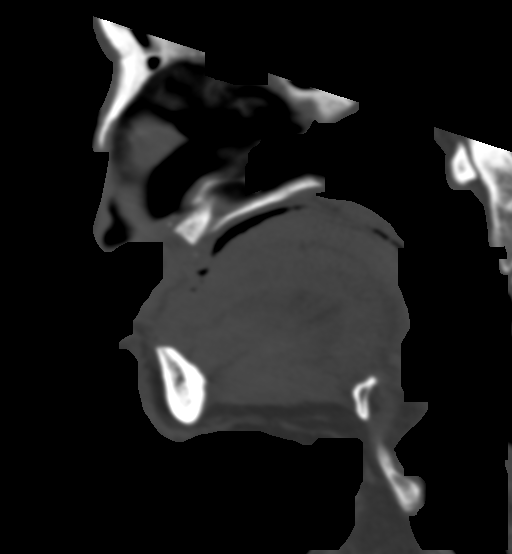
[im 53/79  bone]
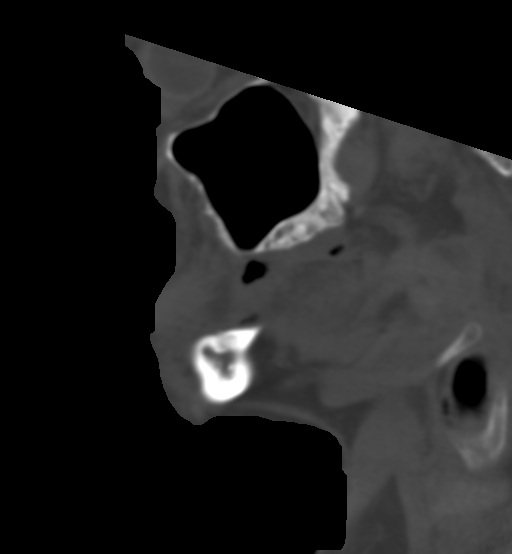
[im 66/79  bone]
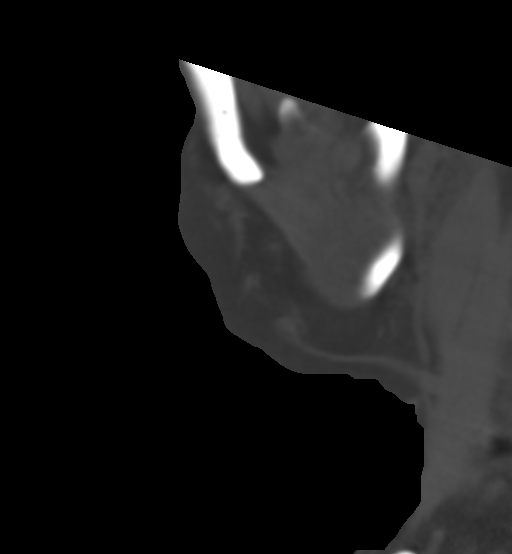

[Series 16: coronal bone · coronal · 0.29mm/px · 1 of 76 slices shown]
[im 38/76  bone]
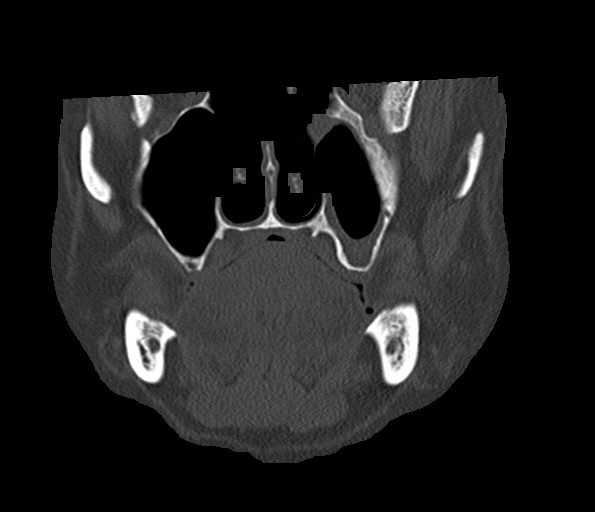

[Series 19: c_spine 2.0 3 st · axial · 0.34mm/px · z∈[-252,-102]mm · 3 of 76 slices shown, 4 images]
[im 1/76  soft-tissue]
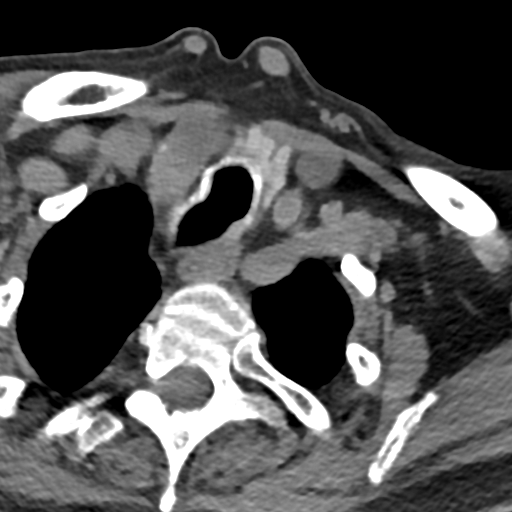
[im 1/76  bone]
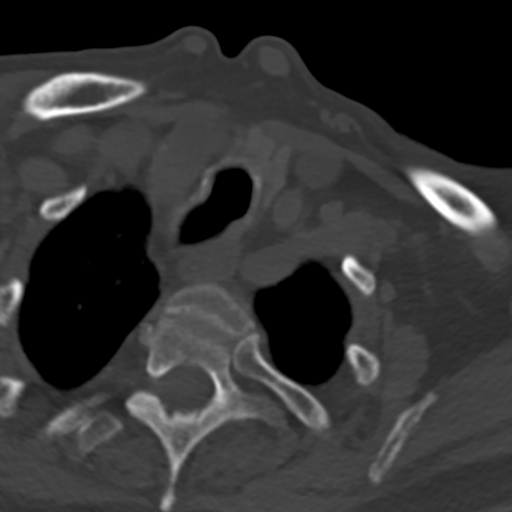
[im 38/76  bone]
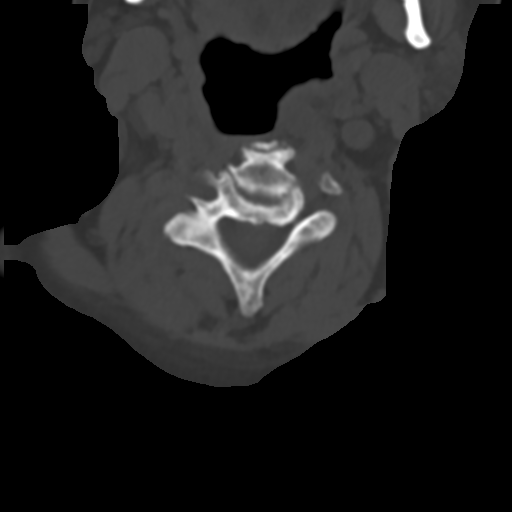
[im 76/76  bone]
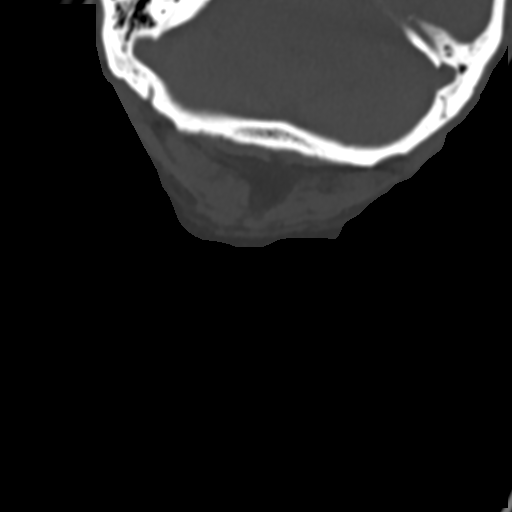

[9 of 34 positions shown; findings below may reference images not displayed]

FINDINGS: CT HEAD FINDINGS

Brain: Sulcal and ventricular prominence consistent with cerebral
atrophy with remote left parietal infarct with resultant
encephalomalacia. No acute intracranial hemorrhage, midline shift or
edema. Intra-axial mass nor extra-axial fluid collections. Midline
fourth ventricle and basal cisterns.

Vascular: No hyperdense vessel sign.

Skull: Intact

Other: None

CT MAXILLOFACIAL FINDINGS

Osseous: No fracture or mandibular dislocation. No destructive
process.

Orbits: Left cataract extraction. No traumatic or inflammatory
finding.

Sinuses: Mild circumferential mucosal thickening of the left
maxillary sinus.

Soft tissues: No significant soft tissue swelling.

CT CERVICAL SPINE FINDINGS

Alignment: Reversal cervical lordosis with mild levoconvex curvature
likely positional. Intact craniocervical relationship and
atlantodental interval.

Skull base and vertebrae: Negative for fracture or listhesis.

Soft tissues and spinal canal: No prevertebral soft tissue swelling.
No visible canal hematoma.

Disc levels: Moderate to marked disc space narrowing from C2 through
C7 most marked at C6-7. Bilateral uncovertebral joint osteoarthritis
from C3 through C7 and on the right at C2-3. Mild bilateral C4-5
neural foraminal encroachment from uncinate spurring and
osteophytes.

Upper chest: No acute abnormality.

Other: None
IMPRESSION: 1. Remote left parietal infarct with volume loss. Otherwise chronic
minimal small vessel ischemia. No acute intracranial abnormality.
2. No acute cervical spine fracture.  Cervical spondylosis.
3. Left maxillary sinus mucosal thickening. No acute maxillofacial
fracture.
